# Patient Record
Sex: Female | Born: 1935
Health system: Southern US, Community
[De-identification: ages and names within clinical notes are randomized; demographics above are authoritative.]

## PROBLEM LIST (undated history)

## (undated) DIAGNOSIS — K219 Gastro-esophageal reflux disease without esophagitis: Secondary | ICD-10-CM

## (undated) DIAGNOSIS — F329 Major depressive disorder, single episode, unspecified: Secondary | ICD-10-CM

## (undated) DIAGNOSIS — E785 Hyperlipidemia, unspecified: Secondary | ICD-10-CM

## (undated) DIAGNOSIS — F418 Other specified anxiety disorders: Secondary | ICD-10-CM

## (undated) DIAGNOSIS — I5022 Chronic systolic (congestive) heart failure: Secondary | ICD-10-CM

## (undated) DIAGNOSIS — I639 Cerebral infarction, unspecified: Secondary | ICD-10-CM

## (undated) DIAGNOSIS — R32 Unspecified urinary incontinence: Secondary | ICD-10-CM

## (undated) DIAGNOSIS — E7800 Pure hypercholesterolemia, unspecified: Secondary | ICD-10-CM

## (undated) DIAGNOSIS — I429 Cardiomyopathy, unspecified: Secondary | ICD-10-CM

## (undated) HISTORY — DX: Cardiomyopathy, unspecified: I42.9

## (undated) HISTORY — PX: TOTAL KNEE ARTHROPLASTY: SHX125

## (undated) HISTORY — DX: Major depressive disorder, single episode, unspecified: F32.9

## (undated) HISTORY — DX: Pure hypercholesterolemia, unspecified: E78.00

## (undated) HISTORY — DX: Other specified anxiety disorders: F41.8

## (undated) HISTORY — DX: Unspecified urinary incontinence: R32

## (undated) HISTORY — DX: Hyperlipidemia, unspecified: E78.5

## (undated) HISTORY — DX: Chronic systolic (congestive) heart failure: I50.22

## (undated) HISTORY — DX: Gastro-esophageal reflux disease without esophagitis: K21.9

## (undated) HISTORY — PX: LEG SURGERY: SHX1003

## (undated) HISTORY — PX: CHOLECYSTECTOMY: SHX55

## (undated) HISTORY — DX: Cerebral infarction, unspecified: I63.9

---

## 2010-10-10 DIAGNOSIS — E785 Hyperlipidemia, unspecified: Secondary | ICD-10-CM | POA: Diagnosis present

## 2010-10-10 HISTORY — DX: Hyperlipidemia, unspecified: E78.5

## 2010-11-12 DIAGNOSIS — M19019 Primary osteoarthritis, unspecified shoulder: Secondary | ICD-10-CM

## 2010-11-12 HISTORY — DX: Primary osteoarthritis, unspecified shoulder: M19.019

## 2011-04-09 DIAGNOSIS — G4733 Obstructive sleep apnea (adult) (pediatric): Secondary | ICD-10-CM

## 2011-04-09 HISTORY — DX: Obstructive sleep apnea (adult) (pediatric): G47.33

## 2011-04-16 DIAGNOSIS — M1710 Unilateral primary osteoarthritis, unspecified knee: Secondary | ICD-10-CM | POA: Insufficient documentation

## 2011-04-16 HISTORY — DX: Unilateral primary osteoarthritis, unspecified knee: M17.10

## 2013-06-17 DIAGNOSIS — I5181 Takotsubo syndrome: Secondary | ICD-10-CM

## 2013-06-17 HISTORY — DX: Takotsubo syndrome: I51.81

## 2013-11-02 DIAGNOSIS — H269 Unspecified cataract: Secondary | ICD-10-CM

## 2013-11-02 HISTORY — DX: Unspecified cataract: H26.9

## 2018-04-06 DIAGNOSIS — K922 Gastrointestinal hemorrhage, unspecified: Secondary | ICD-10-CM

## 2018-04-06 DIAGNOSIS — D72829 Elevated white blood cell count, unspecified: Secondary | ICD-10-CM

## 2018-04-06 DIAGNOSIS — K5731 Diverticulosis of large intestine without perforation or abscess with bleeding: Secondary | ICD-10-CM

## 2018-04-06 HISTORY — DX: Elevated white blood cell count, unspecified: D72.829

## 2018-04-06 HISTORY — DX: Gastrointestinal hemorrhage, unspecified: K92.2

## 2018-04-06 HISTORY — DX: Diverticulosis of large intestine without perforation or abscess with bleeding: K57.31

## 2019-02-13 NOTE — Progress Notes (Signed)
Cardiology Office Note   Date:  02/16/2019   ID:  Kayla Carrillo, DOB 10-Apr-1936, MRN [id number]  PCP:  [name], MD  Cardiologist:   No primary care provider on file. Referring:  [name], MD  Chief Complaint  Patient presents with  . Cardiomyopathy      History of Present Illness: Kayla Carrillo is a 83 y.o. female who is referred by [name], MD presents for cardiomyopathy.      Reviewing her primary care records she has a history of Takotsubo cardiomyopathy many years ago.  I do not have any previous cardiology records.    She does not recall any of the particulars around this or when this was.  She has seen a cardiologist that is about a year behind since she moved from [place] to live here.  She has a husband with Alzheimer's who was taken back to the [facility] by his daughters.  Apparently there was family strength around this and she is now living here.  Sounds like she has had a stress.  She does relatively well from a cardiac standpoint it sounds like.  She does not describe shortness of breath, PND or orthopnea.  She does not have chest pressure, neck or arm discomfort.  However, there is a question of falls versus syncope.  She has had falls with trauma and may or may not have very brief episodes of consciousness.  She cannot recall the particulars about this.  She does have an abnormal EKG but I do not have one for baseline.  She has worn an event monitor she said some years ago that was unremarkable.   Past Medical History:  Diagnosis Date  . Cardiomyopathy (HCC)    Takotsubo  . Depression with anxiety   . Dyslipidemia   . GERD (gastroesophageal reflux disease)     Past Surgical History:  Procedure Laterality Date  . CHOLECYSTECTOMY    . LEG SURGERY     Metal rod left leg  . TOTAL KNEE ARTHROPLASTY     Bilateral     Current Outpatient Medications  Medication Sig Dispense Refill  . Ascorbic Acid (VITAMIN C) 100 MG tablet Take  100 mg by mouth daily.    Marland Kitchen aspirin EC 81 MG tablet Take 81 mg by mouth daily.    . carvedilol (COREG) 6.25 MG tablet Take 6.25 mg by mouth 2 (two) times daily with a meal.    . GLUCOSAMINE-CALCIUM-VIT D PO Take by mouth.    . meclizine (ANTIVERT) 25 MG tablet Take 25 mg by mouth 3 (three) times daily as needed for dizziness.    . meloxicam (MOBIC) 15 MG tablet Take 15 mg by mouth daily.    . Omega-3 Fatty Acids (FISH OIL) 1000 MG CAPS Take 1 capsule by mouth daily.     Marland Kitchen omeprazole (PRILOSEC) 20 MG capsule Take 20 mg by mouth daily.    . promethazine-dextromethorphan (PROMETHAZINE-DM) 6.25-15 MG/5ML syrup Take 5 mLs by mouth 4 (four) times daily as needed for cough.    . sertraline (ZOLOFT) 50 MG tablet Take 50 mg by mouth daily.    . simvastatin (ZOCOR) 40 MG tablet Take 40 mg by mouth daily.     No current facility-administered medications for this visit.     Allergies:   Codeine sulfate [codeine] and Sulfa antibiotics    Social History:  The patient  reports that she has never smoked. She has never used smokeless tobacco.   Family History:  The  patient's family history is not on file.    ROS:  Please see the history of present illness.   Otherwise, review of systems are positive for decreased memory.   All other systems are reviewed and negative.    PHYSICAL EXAM: VS:  BP 90/62 (BP Location: Left Arm, Patient Position: Sitting, Cuff Size: Normal)   Pulse 97   Ht 5\' 3"  (1.6 m)   Wt 184 lb (83.5 kg)   BMI 32.59 kg/m  , BMI Body mass index is 32.59 kg/m. GENERAL:  Well appearing HEENT:  Pupils equal round and reactive, fundi not visualized, oral mucosa unremarkable NECK:  No jugular venous distention, waveform within normal limits, carotid upstroke brisk and symmetric, no bruits, no thyromegaly LYMPHATICS:  No cervical, inguinal adenopathy LUNGS:  Clear to auscultation bilaterally BACK:  No CVA tenderness CHEST:  Unremarkable HEART:  PMI not displaced or sustained,S1 and S2  within normal limits, no S3, no S4, no clicks, no rubs, no murmurs ABD:  Flat, positive bowel sounds normal in frequency in pitch, no bruits, no rebound, no guarding, no midline pulsatile mass, no hepatomegaly, no splenomegaly EXT:  2 plus pulses throughout, no edema, no cyanosis no clubbing SKIN:  No rashes no nodules NEURO:  Cranial nerves II through XII grossly intact, motor grossly intact throughout PSYCH:  Cognitively intact, oriented to person place and time    EKG:  EKG is ordered today. The ekg ordered today demonstrates sinus rhythm, rate 97, borderline interventricular conduction delay, left axis deviation, poor anterior R wave progression, possible old anteroseptal infarct   Recent Labs: No results found for requested labs within last 8760 hours.    Lipid Panel No results found for: CHOL, TRIG, HDL, CHOLHDL, VLDL, LDLCALC, LDLDIRECT    Wt Readings from Last 3 Encounters:  02/16/19 184 lb (83.5 kg)      Other studies Reviewed: Additional studies/ records that were reviewed today include: None. Review of the above records demonstrates:  Please see elsewhere in the note.     ASSESSMENT AND PLAN:  CARDIOMYOPATHY: Further evaluate I will check an echocardiogram.  She does seem to be euvolemic.  I will try to get old records to further understand this event.  At this point I do not see need to change medications.  SYNCOPE: It is unclear whether she is having syncope.  And then try to get previous records.  I will have a low threshold for ordering an event recorder in the future.  Of note she was not orthostatic in the office today.  There was an initial slight drop in her blood pressure and I talked about this but it did not sustain and came back up within a couple of minutes.  ABNORMAL EKG: I am going to start with the echocardiogram as above.   Current medicines are reviewed at length with the patient today.  The patient does not have concerns regarding  medicines.  The following changes have been made:  no change  Labs/ tests ordered today include:   Orders Placed This Encounter  Procedures  . EKG 12-Lead  . ECHOCARDIOGRAM COMPLETE     Disposition:   FU with me in two months.     Signed, Rollene Rotunda, MD  02/16/2019 3:23 PM    [facility]

## 2019-02-16 ENCOUNTER — Encounter (INDEPENDENT_AMBULATORY_CARE_PROVIDER_SITE_OTHER): Payer: Self-pay

## 2019-02-16 ENCOUNTER — Encounter: Payer: Self-pay | Admitting: Cardiology

## 2019-02-16 ENCOUNTER — Ambulatory Visit (INDEPENDENT_AMBULATORY_CARE_PROVIDER_SITE_OTHER): Payer: Medicare Other | Admitting: Cardiology

## 2019-02-16 VITALS — BP 90/62 | HR 97 | Ht 63.0 in | Wt 184.0 lb

## 2019-02-16 DIAGNOSIS — R55 Syncope and collapse: Secondary | ICD-10-CM | POA: Insufficient documentation

## 2019-02-16 DIAGNOSIS — I429 Cardiomyopathy, unspecified: Secondary | ICD-10-CM

## 2019-02-16 DIAGNOSIS — R9431 Abnormal electrocardiogram [ECG] [EKG]: Secondary | ICD-10-CM | POA: Insufficient documentation

## 2019-02-16 HISTORY — DX: Abnormal electrocardiogram (ECG) (EKG): R94.31

## 2019-02-16 NOTE — Patient Instructions (Addendum)
Medication Instructions:  Continue current medications  If you need a refill on your cardiac medications before your next appointment, please call your pharmacy.  Labwork: None Ordered .  Testing/Procedures: Your physician has requested that you have an echocardiogram. Echocardiography is a painless test that uses sound waves to create images of your heart. It provides your doctor with information about the size and shape of your heart and how well your heart's chambers and valves are working. This procedure takes approximately one hour. There are no restrictions for this procedure.   Follow-Up: . Your physician recommends that you schedule a follow-up appointment in: 2 Months   At [facility], you and your health needs are our priority.  As part of our continuing mission to provide you with exceptional heart care, we have created designated Provider Care Teams.  These Care Teams include your primary Cardiologist (physician) and Advanced Practice Providers (APPs -  Physician Assistants and Nurse Practitioners) who all work together to provide you with the care you need, when you need it.  Thank you for choosing [facility] at [facility]!!

## 2019-02-26 ENCOUNTER — Telehealth: Payer: Self-pay | Admitting: Cardiology

## 2019-02-26 NOTE — Telephone Encounter (Signed)
Records received from [facility] on 02/26/19, Appt 04/20/19 @ 2:40PM. NV

## 2019-04-15 ENCOUNTER — Telehealth: Payer: Self-pay | Admitting: Cardiology

## 2019-04-15 NOTE — Telephone Encounter (Signed)
Left detailed message that someone will call re echo and appt when staff is working on Tuesday's schedule .cy

## 2019-04-15 NOTE — Telephone Encounter (Signed)
New Message    Pt is calling about her Echo and Office Visit appt  She wants to know if she has to change these appts   Please call

## 2019-04-16 ENCOUNTER — Telehealth: Payer: Self-pay | Admitting: Internal Medicine

## 2019-04-16 NOTE — Telephone Encounter (Signed)
Agree.  Please reschedule for June

## 2019-04-16 NOTE — Telephone Encounter (Signed)
Reviewed records Pt seen by J Hochrein.   Echo ordered to establish LVEF  With corona virus pandemic and recommendations to minimize patient/staff exposure would recomm postponing echo to June  When things should improve from infection standpoint    Will forward to [employer]

## 2019-04-20 ENCOUNTER — Ambulatory Visit: Payer: Medicare Other | Admitting: Cardiology

## 2019-04-20 ENCOUNTER — Other Ambulatory Visit (HOSPITAL_COMMUNITY): Payer: Medicare Other

## 2019-06-16 ENCOUNTER — Telehealth (HOSPITAL_COMMUNITY): Payer: Self-pay | Admitting: Radiology

## 2019-06-16 NOTE — Telephone Encounter (Signed)
COVID-19 Pre-Screening Questions:  . Do you currently have a fever?NO (yes = cancel and refer to pcp for e-visit) . Have you recently travelled on a cruise, internationally, or to [place], [place], [place], [place], [place], or [place], [place] ([place]) ? NO (yes = cancel, stay home, monitor symptoms, and contact pcp or initiate e-visit if symptoms develop) . Have you been in contact with someone that is currently pending confirmation of Covid19 testing or has been confirmed to have the Covid19 virus?  NO (yes = cancel, stay home, away from tested individual, monitor symptoms, and contact pcp or initiate e-visit if symptoms develop) . Are you currently experiencing fatigue or cough? NO (yes = pt should be prepared to have a mask placed at the time of their visit).      

## 2019-06-17 ENCOUNTER — Ambulatory Visit (HOSPITAL_COMMUNITY): Payer: Medicare Other | Attending: Internal Medicine

## 2019-06-17 ENCOUNTER — Encounter (INDEPENDENT_AMBULATORY_CARE_PROVIDER_SITE_OTHER): Payer: Self-pay

## 2019-06-17 ENCOUNTER — Other Ambulatory Visit: Payer: Self-pay

## 2019-06-17 DIAGNOSIS — I447 Left bundle-branch block, unspecified: Secondary | ICD-10-CM | POA: Diagnosis not present

## 2019-06-17 DIAGNOSIS — E785 Hyperlipidemia, unspecified: Secondary | ICD-10-CM | POA: Diagnosis not present

## 2019-06-17 DIAGNOSIS — I429 Cardiomyopathy, unspecified: Secondary | ICD-10-CM | POA: Diagnosis present

## 2019-06-17 DIAGNOSIS — F418 Other specified anxiety disorders: Secondary | ICD-10-CM | POA: Insufficient documentation

## 2019-06-28 ENCOUNTER — Ambulatory Visit: Payer: Medicare Other | Admitting: Cardiology

## 2019-06-28 ENCOUNTER — Telehealth: Payer: Self-pay | Admitting: Cardiology

## 2019-06-28 NOTE — Telephone Encounter (Signed)
LVM, reminding pt of her appt on 06-29-19 with Dr Percival Spanish.

## 2019-06-28 NOTE — Progress Notes (Signed)
Cardiology Office Note   Date:  06/29/2019   ID:  Kayla Carrillo, DOB 1936-09-13, MRN [id number]  PCP:  Lujean Amel, MD  Cardiologist:   No primary care provider on file. Referring:  Lujean Amel, MD  No chief complaint on file.     History of Present Illness: Kayla Carrillo is a 83 y.o. female who is referred by Lujean Amel, MD presents for cardiomyopathy.   She had a history of Takotsubo cardiomyopathy many years ago.  I saw her as a new patient and she was to get an echo but then the pandemic happened.  She just had the echo a few days ago and this demonstrated a reduced EF of 45% but with severe LVH.      I was able to look back and find a 2018 echocardiogram from another facility and her EF was reported to be 55% with moderate left ventricular hypertrophy.  She does not have any new symptoms specific to a cardiac etiology.  She has a lot of stress in her life as she is going through a divorce.  She does have fatigue easily.  However, she is not describing overt shortness of breath, PND or orthopnea.  She is not having any palpitations, presyncope or syncope.  As previously described she has falls but these have not been specifically described as loss of consciousness.  She walks with a cane.  She has not fallen in quite a while actually.   Past Medical History:  Diagnosis Date  . Cardiomyopathy ([place])    Takotsubo  . Depression with anxiety   . Dyslipidemia   . GERD (gastroesophageal reflux disease)     Past Surgical History:  Procedure Laterality Date  . CHOLECYSTECTOMY    . LEG SURGERY     Metal rod left leg  . TOTAL KNEE ARTHROPLASTY     Bilateral     Current Outpatient Medications  Medication Sig Dispense Refill  . Ascorbic Acid (VITAMIN C) 100 MG tablet Take 100 mg by mouth daily.    Marland Kitchen aspirin EC 81 MG tablet Take 81 mg by mouth daily.    . carvedilol (COREG) 6.25 MG tablet Take 6.25 mg by mouth 2 (two) times daily with a meal.    .  GLUCOSAMINE-CALCIUM-VIT D PO Take by mouth.    . meclizine (ANTIVERT) 25 MG tablet Take 25 mg by mouth 3 (three) times daily as needed for dizziness.    . meloxicam (MOBIC) 15 MG tablet Take 15 mg by mouth daily.    . Omega-3 Fatty Acids (FISH OIL) 1000 MG CAPS Take 1 capsule by mouth daily.     Marland Kitchen omeprazole (PRILOSEC) 20 MG capsule Take 20 mg by mouth daily.    . promethazine-dextromethorphan (PROMETHAZINE-DM) 6.25-15 MG/5ML syrup Take 5 mLs by mouth 4 (four) times daily as needed for cough.    . sertraline (ZOLOFT) 50 MG tablet Take 50 mg by mouth daily.    . simvastatin (ZOCOR) 40 MG tablet Take 40 mg by mouth daily.     No current facility-administered medications for this visit.     Allergies:   Codeine sulfate [codeine] and Sulfa antibiotics    ROS:  Please see the history of present illness.   Otherwise, review of systems are positive for decreased memory,'s urinary incontinence, falls with injury.   All other systems are reviewed and negative.    PHYSICAL EXAM: VS:  BP 125/72   Pulse 80   Temp 98.7 F (37.1 C)  Ht 5\' 3"  (1.6 m)   Wt 184 lb 6.4 oz (83.6 kg)   BMI 32.66 kg/m  , BMI Body mass index is 32.66 kg/m. GENERAL:  Well appearing NECK:  No jugular venous distention, waveform within normal limits, carotid upstroke brisk and symmetric, no bruits, no thyromegaly LUNGS:  Clear to auscultation bilaterally CHEST:  Unremarkable HEART:  PMI not displaced or sustained,S1 and S2 within normal limits, no S3, no S4, no clicks, no rubs, no murmurs ABD:  Flat, positive bowel sounds normal in frequency in pitch, no bruits, no rebound, no guarding, no midline pulsatile mass, no hepatomegaly, no splenomegaly EXT:  2 plus pulses throughout, mild leg edema, no cyanosis no clubbing    EKG:  EKG is not ordered today.   Recent Labs: No results found for requested labs within last 8760 hours.    Lipid Panel No results found for: CHOL, TRIG, HDL, CHOLHDL, VLDL, LDLCALC,  LDLDIRECT    Wt Readings from Last 3 Encounters:  06/29/19 184 lb 6.4 oz (83.6 kg)  02/16/19 184 lb (83.5 kg)      Other studies Reviewed: Additional studies/ records that were reviewed today include: Echo Care Everywhere. Review of the above records demonstrates:  Please see elsewhere in the note.     ASSESSMENT AND PLAN:  CARDIOMYOPATHY:    With her reduced ejection fraction and LVH I will check a PYP scan for amyloidosis.  I have a low suspicion this is actually going on.  Unfortunately her meds cannot be titrated because she has baseline low blood pressures.   LVH: This will be evaluated as above.  SYNCOPE: She has no evidence of frank syncope.  It looks like she has falls.  She has had an extensive work-up to include a monitor in the past.  No further work-up is suggested at this point.  Of note she was not orthostatic in the office previously.    Current medicines are reviewed at length with the patient today.  The patient does not have concerns regarding medicines.  The following changes have been made:  no change  Labs/ tests ordered today include:    No orders of the defined types were placed in this encounter.    Disposition:   FU with me in 3 months.     Signed, Rollene RotundaJames Yatziry Deakins, MD  06/29/2019 11:34 AM    [facility]

## 2019-06-29 ENCOUNTER — Encounter: Payer: Self-pay | Admitting: Cardiology

## 2019-06-29 ENCOUNTER — Other Ambulatory Visit: Payer: Self-pay

## 2019-06-29 ENCOUNTER — Ambulatory Visit (INDEPENDENT_AMBULATORY_CARE_PROVIDER_SITE_OTHER): Payer: Medicare Other | Admitting: Cardiology

## 2019-06-29 VITALS — BP 125/72 | HR 80 | Temp 98.7°F | Ht 63.0 in | Wt 184.4 lb

## 2019-06-29 DIAGNOSIS — E859 Amyloidosis, unspecified: Secondary | ICD-10-CM | POA: Diagnosis not present

## 2019-06-29 DIAGNOSIS — I429 Cardiomyopathy, unspecified: Secondary | ICD-10-CM

## 2019-06-29 DIAGNOSIS — I517 Cardiomegaly: Secondary | ICD-10-CM

## 2019-06-29 NOTE — Patient Instructions (Signed)
Medication Instructions:  Your physician recommends that you continue on your current medications as directed. Please refer to the Current Medication list given to you today.  If you need a refill on your cardiac medications before your next appointment, please call your pharmacy.   Lab work: NONE  Testing/Procedures: NUCLEAR AMYLOIDOSIS TEST   Follow-Up: At [facility], you and your health needs are our priority.  As part of our continuing mission to provide you with exceptional heart care, we have created designated Provider Care Teams.  These Care Teams include your primary Cardiologist (physician) and Advanced Practice Providers (APPs -  Physician Assistants and Nurse Practitioners) who all work together to provide you with the care you need, when you need it. You will need a follow up appointment in 6 months.  Please call our office 2 months in advance to schedule this appointment.  You may see DR [facility]  or one of the following Advanced Practice Providers on your designated Care Team:   Rosaria Ferries, PA-C . Jory Sims, DNP, ANP

## 2019-07-14 ENCOUNTER — Telehealth (HOSPITAL_COMMUNITY): Payer: Self-pay

## 2019-07-14 NOTE — Telephone Encounter (Signed)
Encounter complete. 

## 2019-07-15 ENCOUNTER — Ambulatory Visit (HOSPITAL_COMMUNITY)
Admission: RE | Admit: 2019-07-15 | Discharge: 2019-07-15 | Disposition: A | Discharge status: Home / Self Care | Payer: Medicare Other | Source: Ambulatory Visit | Attending: Cardiology | Admitting: Cardiology

## 2019-07-15 ENCOUNTER — Other Ambulatory Visit: Payer: Self-pay

## 2019-07-15 DIAGNOSIS — E859 Amyloidosis, unspecified: Secondary | ICD-10-CM | POA: Insufficient documentation

## 2019-07-15 MED ORDER — TECHNETIUM TC 99M PYROPHOSPHATE
20.4000 | Freq: Once | INTRAVENOUS | Status: AC
  Administered 2019-07-15: 20.4 via INTRAVENOUS

## 2019-07-16 ENCOUNTER — Telehealth: Payer: Self-pay | Admitting: *Deleted

## 2019-07-16 DIAGNOSIS — R55 Syncope and collapse: Secondary | ICD-10-CM

## 2019-07-16 DIAGNOSIS — I517 Cardiomegaly: Secondary | ICD-10-CM

## 2019-07-16 DIAGNOSIS — I429 Cardiomyopathy, unspecified: Secondary | ICD-10-CM

## 2019-07-16 NOTE — Telephone Encounter (Signed)
Left message to call back  

## 2019-07-16 NOTE — Telephone Encounter (Signed)
-----  Message from James Hochrein, MD sent at 07/15/2019  9:39 PM EDT ----- PYP scan is equivocal for amyloid.  Order multiple myeloma panel.  Call Ms. Dangerfield with the results and send results to Koirala, Dibas, MD 

## 2019-07-21 ENCOUNTER — Other Ambulatory Visit: Payer: Self-pay | Admitting: Family Medicine

## 2019-07-21 DIAGNOSIS — E2839 Other primary ovarian failure: Secondary | ICD-10-CM

## 2019-07-29 NOTE — Telephone Encounter (Signed)
Received a call from [facility] and she will have nurse call back

## 2019-07-29 NOTE — Telephone Encounter (Signed)
Spoke with patient regarding results. She is scheduled to have labs at [facility] 8/7. Left message for [facility] to call back to see about faxing orders for her labs to all be done in our office.

## 2019-07-30 NOTE — Telephone Encounter (Signed)
Spoke with [place] and they were going to see if labs could be done at [facility], outside labs not usually done there. If so were going to fax orders. No orders received as of yet. Placed order for multiple myeloma panel. Left message for patient to call back so she can come next week for lab Dr Percival Spanish requesting.

## 2019-08-05 ENCOUNTER — Telehealth: Payer: Self-pay | Admitting: *Deleted

## 2019-08-05 NOTE — Telephone Encounter (Signed)
The patient has been made aware that the lab orders have been faxed and received. She will come tomorrow for the fasting labs. Orders will be scanned.   Spoke with [place] and they were going to see if labs could be done at [facility], outside labs not usually done there. If so were going to fax orders. No orders received as of yet. Placed order for multiple myeloma panel. Left message for patient to call back so she can come next week for lab Dr Percival Spanish requesting.

## 2019-08-09 LAB — MULTIPLE MYELOMA PANEL, SERUM
Albumin SerPl Elph-Mcnc: 3.4 g/dL (ref 2.9–4.4)
Albumin/Glob SerPl: 1 (ref 0.7–1.7)
Alpha 1: 0.3 g/dL (ref 0.0–0.4)
Alpha2 Glob SerPl Elph-Mcnc: 0.9 g/dL (ref 0.4–1.0)
B-Globulin SerPl Elph-Mcnc: 0.9 g/dL (ref 0.7–1.3)
Gamma Glob SerPl Elph-Mcnc: 1.4 g/dL (ref 0.4–1.8)
Globulin, Total: 3.5 g/dL (ref 2.2–3.9)
IgA/Immunoglobulin A, Serum: 316 mg/dL (ref 64–422)
IgG (Immunoglobin G), Serum: 1533 mg/dL (ref 586–1602)
IgM (Immunoglobulin M), Srm: 44 mg/dL (ref 26–217)
Total Protein: 6.9 g/dL (ref 6.0–8.5)

## 2019-08-09 NOTE — Telephone Encounter (Signed)
Patient had labs last week

## 2019-08-12 ENCOUNTER — Telehealth: Payer: Self-pay | Admitting: *Deleted

## 2019-08-12 NOTE — Telephone Encounter (Signed)
-----   Message from Minus Breeding, MD sent at 08/10/2019  8:31 AM EDT ----- Panel was negative and does not suggest an infiltrative process.  Call Ms. Masci with the results and send results to Koirala, Dibas, MD.  No further imaging at this point.

## 2019-08-12 NOTE — Telephone Encounter (Signed)
Advised patient of lab results  

## 2019-10-04 ENCOUNTER — Other Ambulatory Visit: Payer: Medicare Other

## 2020-01-12 DIAGNOSIS — N958 Other specified menopausal and perimenopausal disorders: Secondary | ICD-10-CM | POA: Diagnosis not present

## 2020-01-12 DIAGNOSIS — Z01419 Encounter for gynecological examination (general) (routine) without abnormal findings: Secondary | ICD-10-CM | POA: Diagnosis not present

## 2020-01-12 DIAGNOSIS — N762 Acute vulvitis: Secondary | ICD-10-CM | POA: Diagnosis not present

## 2020-01-12 DIAGNOSIS — Z6832 Body mass index (BMI) 32.0-32.9, adult: Secondary | ICD-10-CM | POA: Diagnosis not present

## 2020-01-12 DIAGNOSIS — R32 Unspecified urinary incontinence: Secondary | ICD-10-CM | POA: Diagnosis not present

## 2020-01-12 DIAGNOSIS — Z1231 Encounter for screening mammogram for malignant neoplasm of breast: Secondary | ICD-10-CM | POA: Diagnosis not present

## 2020-01-25 DIAGNOSIS — Z79899 Other long term (current) drug therapy: Secondary | ICD-10-CM | POA: Diagnosis not present

## 2020-01-25 DIAGNOSIS — I5022 Chronic systolic (congestive) heart failure: Secondary | ICD-10-CM | POA: Diagnosis not present

## 2020-01-25 DIAGNOSIS — R209 Unspecified disturbances of skin sensation: Secondary | ICD-10-CM | POA: Diagnosis not present

## 2020-01-25 DIAGNOSIS — F321 Major depressive disorder, single episode, moderate: Secondary | ICD-10-CM | POA: Diagnosis not present

## 2020-01-25 DIAGNOSIS — E2839 Other primary ovarian failure: Secondary | ICD-10-CM | POA: Diagnosis not present

## 2020-01-25 DIAGNOSIS — R300 Dysuria: Secondary | ICD-10-CM | POA: Diagnosis not present

## 2020-01-25 DIAGNOSIS — I5181 Takotsubo syndrome: Secondary | ICD-10-CM | POA: Diagnosis not present

## 2020-02-03 DIAGNOSIS — H35351 Cystoid macular degeneration, right eye: Secondary | ICD-10-CM | POA: Diagnosis not present

## 2020-02-03 DIAGNOSIS — H353132 Nonexudative age-related macular degeneration, bilateral, intermediate dry stage: Secondary | ICD-10-CM | POA: Diagnosis not present

## 2020-02-03 DIAGNOSIS — H353211 Exudative age-related macular degeneration, right eye, with active choroidal neovascularization: Secondary | ICD-10-CM | POA: Diagnosis not present

## 2020-03-14 DIAGNOSIS — H353132 Nonexudative age-related macular degeneration, bilateral, intermediate dry stage: Secondary | ICD-10-CM | POA: Diagnosis not present

## 2020-03-14 DIAGNOSIS — H35351 Cystoid macular degeneration, right eye: Secondary | ICD-10-CM | POA: Diagnosis not present

## 2020-03-14 DIAGNOSIS — H3561 Retinal hemorrhage, right eye: Secondary | ICD-10-CM | POA: Diagnosis not present

## 2020-03-14 DIAGNOSIS — H353211 Exudative age-related macular degeneration, right eye, with active choroidal neovascularization: Secondary | ICD-10-CM | POA: Diagnosis not present

## 2020-04-25 ENCOUNTER — Other Ambulatory Visit: Payer: Self-pay

## 2020-04-25 ENCOUNTER — Encounter (INDEPENDENT_AMBULATORY_CARE_PROVIDER_SITE_OTHER): Payer: Self-pay | Admitting: Ophthalmology

## 2020-04-25 ENCOUNTER — Ambulatory Visit (INDEPENDENT_AMBULATORY_CARE_PROVIDER_SITE_OTHER): Payer: PPO | Admitting: Ophthalmology

## 2020-04-25 DIAGNOSIS — H35351 Cystoid macular degeneration, right eye: Secondary | ICD-10-CM

## 2020-04-25 DIAGNOSIS — H353122 Nonexudative age-related macular degeneration, left eye, intermediate dry stage: Secondary | ICD-10-CM | POA: Diagnosis not present

## 2020-04-25 DIAGNOSIS — H353211 Exudative age-related macular degeneration, right eye, with active choroidal neovascularization: Secondary | ICD-10-CM | POA: Insufficient documentation

## 2020-04-25 HISTORY — DX: Nonexudative age-related macular degeneration, left eye, intermediate dry stage: H35.3122

## 2020-04-25 HISTORY — DX: Exudative age-related macular degeneration, right eye, with active choroidal neovascularization: H35.3211

## 2020-04-25 HISTORY — DX: Cystoid macular degeneration, right eye: H35.351

## 2020-04-25 MED ORDER — BEVACIZUMAB CHEMO INJECTION 1.25MG/0.05ML SYRINGE FOR KALEIDOSCOPE
1.2500 mg | INTRAVITREAL | Status: AC | PRN
  Administered 2020-04-25: 15:00:00 1.25 mg via INTRAVITREAL

## 2020-04-25 NOTE — Progress Notes (Signed)
04/25/2020     CHIEF COMPLAINT Patient presents for Retina Follow Up   HISTORY OF PRESENT ILLNESS: Kayla Carrillo is a 84 y.o. female who presents to the clinic today for:   HPI    Retina Follow Up    Patient presents with  Wet AMD.  In right eye.  This started 6 weeks ago.  Severity is mild.  Duration of 6 weeks.  Since onset it is stable.          Comments    6 Week AMD F/U OD, poss Avastin OD  Pt denies noticeable changes to [place] OU since last visit. Pt denies ocular pain, flashes of light, or floaters OU.         Last edited by Rockie Neighbours, [place] on 04/25/2020  2:05 PM. (History)      Referring physician: Lujean Amel, MD [facility] [address],  [place] [address]  HISTORICAL INFORMATION:   Selected notes from the [facility]: No current outpatient medications on file. (Ophthalmic Drugs)   No current facility-administered medications for this visit. (Ophthalmic Drugs)   Current Outpatient Medications (Other)  Medication Sig  . Ascorbic Acid (VITAMIN C) 100 MG tablet Take 100 mg by mouth daily.  Marland Kitchen aspirin EC 81 MG tablet Take 81 mg by mouth daily.  . carvedilol (COREG) 6.25 MG tablet Take 6.25 mg by mouth 2 (two) times daily with a meal.  . GLUCOSAMINE-CALCIUM-VIT D PO Take by mouth.  . meclizine (ANTIVERT) 25 MG tablet Take 25 mg by mouth 3 (three) times daily as needed for dizziness.  . meloxicam (MOBIC) 15 MG tablet Take 15 mg by mouth daily.  . Omega-3 Fatty Acids (FISH OIL) 1000 MG CAPS Take 1 capsule by mouth daily.   Marland Kitchen omeprazole (PRILOSEC) 20 MG capsule Take 20 mg by mouth daily.  . promethazine-dextromethorphan (PROMETHAZINE-DM) 6.25-15 MG/5ML syrup Take 5 mLs by mouth 4 (four) times daily as needed for cough.  . sertraline (ZOLOFT) 50 MG tablet Take 50 mg by mouth daily.  . simvastatin (ZOCOR) 40 MG tablet Take 40 mg by mouth daily.   No current facility-administered medications for this  visit. (Other)      REVIEW OF SYSTEMS:    ALLERGIES Allergies  Allergen Reactions  . Codeine Sulfate [Codeine] Other (See Comments)    drowsey  . Sulfa Antibiotics Other (See Comments)    drowsey    PAST MEDICAL HISTORY Past Medical History:  Diagnosis Date  . Cardiomyopathy ([place])    Takotsubo  . Depression with anxiety   . Dyslipidemia   . GERD (gastroesophageal reflux disease)    Past Surgical History:  Procedure Laterality Date  . CHOLECYSTECTOMY    . LEG SURGERY     Metal rod left leg  . TOTAL KNEE ARTHROPLASTY     Bilateral    FAMILY HISTORY History reviewed. No pertinent family history.  SOCIAL HISTORY Social History   Tobacco Use  . Smoking status: Never Smoker  . Smokeless tobacco: Never Used  Substance Use Topics  . Alcohol use: Not on file  . Drug use: Not on file         OPHTHALMIC EXAM:  Base Eye Exam    Visual Acuity (ETDRS)      Right Left   Dist [place] 20/50 +1 20/40 -2   Dist ph  20/40 +2 20/25       Tonometry (Tonopen, 2:12 PM)  Right Left   Pressure 10 12       Pupils      Pupils Dark Light Shape React APD   Right PERRL 4 3 Round Brisk None   Left PERRL 4 3 Round Brisk None       Visual Fields (Counting fingers)      Left Right    Full Full       Extraocular Movement      Right Left    Full Full       Neuro/Psych    Oriented x3: Yes   Mood/Affect: Normal       Dilation    Right eye: 1.0% Mydriacyl, 2.5% Phenylephrine @ 2:12 PM        Slit Lamp and Fundus Exam    External Exam      Right Left   External Normal Normal       Slit Lamp Exam      Right Left   Lids/Lashes Normal Normal   Conjunctiva/Sclera White and quiet White and quiet   Cornea Clear Clear   Anterior Chamber Deep and quiet Deep and quiet   Iris Round and reactive Round and reactive   Lens Posterior chamber intraocular lens Posterior chamber intraocular lens   Anterior Vitreous Normal Normal       Fundus Exam      Right Left     Posterior Vitreous Posterior vitreous detachment    Disc Normal    C/D Ratio 0.1    Macula Geographic atrophy,,, Drusen, Hard drusen    Vessels Normal    Periphery Normal           IMAGING AND PROCEDURES  Imaging and Procedures for 04/25/20  OCT, Retina - OU - Both Eyes       Right Eye Quality was good. Scan locations included subfoveal. Central Foveal Thickness: 242. Progression has improved. Findings include outer retinal atrophy, intraretinal fluid.   Left Eye Quality was good. Scan locations included subfoveal. Central Foveal Thickness: 257. Progression has been stable. Findings include abnormal foveal contour, retinal drusen , central retinal atrophy.   Notes The much less active CNVM and intraretinal CME yet watch nasal region.  Repeat intravitreal Avastin OD today and exam in 6 weeks       Intravitreal Injection, Pharmacologic Agent - OD - Right Eye       Time Out 04/25/2020. 3:11 PM. Confirmed correct patient, procedure, site, and patient consented.   Anesthesia Topical anesthesia was used. Anesthetic medications included [place] 3.5%.   Procedure Preparation included Tobramycin 0.3%, 10% betadine to eyelids.   Injection:  1.25 mg Bevacizumab (AVASTIN) SOLN   NDC: [address]-6, Lot: [id number]   Route: Intravitreal, Site: Right Eye, Waste: 0 mg  Post-op Post injection exam found visual acuity of at least counting fingers. The patient tolerated the procedure well. There were no complications. The patient received written and verbal post procedure care education. Post injection medications were not given.                 ASSESSMENT/PLAN:  Exudative age-related macular degeneration of right eye with active choroidal neovascularization (HCC) The nature of wet macular degeneration was discussed with the patient.  Forms of therapy reviewed include the use of Anti-VEGF medications injected painlessly into the eye, as well as other possible treatment  modalities, including thermal laser therapy. Fellow eye involvement and risks were discussed with the patient. Upon the finding of wet age related macular degeneration, treatment will be offered.  The treatment regimen is on a treat as needed basis with the intent to treat if necessary and extend interval of exams when possible. On average 1 out of 6 patients do not need lifetime therapy. However, the risk of recurrent disease is high for a lifetime.  Initially monthly, then periodic, examinations and evaluations will determine whether the next treatment is required on the day of the examination.  OD, improved and stable yet watch nasal to the foveal avascular zone, repeat Avastin today intravitreal and exam in 6 weeks      ICD-10-CM   1. Exudative age-related macular degeneration of right eye with active choroidal neovascularization (HCC)  H35.3211 OCT, Retina - OU - Both Eyes    Intravitreal Injection, Pharmacologic Agent - OD - Right Eye    Bevacizumab (AVASTIN) SOLN 1.25 mg  2. Cystoid macular edema of right eye  H35.351   3. Intermediate stage nonexudative age-related macular degeneration of left eye  H35.3122     1.The much less active CNVM and intraretinal CME yet watch nasal region.  Repeat intravitreal Avastin OD today and exam in 6 weeks  2.  3.  Ophthalmic Meds Ordered this visit:  Meds ordered this encounter  Medications  . Bevacizumab (AVASTIN) SOLN 1.25 mg       Return in about 6 weeks (around 06/06/2020) for OD, AVASTIN OCT.  There are no Patient Instructions on file for this visit.   Explained the diagnoses, plan, and follow up with the patient and they expressed understanding.  Patient expressed understanding of the importance of proper follow up care.   Alford Highland Josias Tomerlin M.D. Diseases & Surgery of the Retina and Vitreous [facility] 04/25/20     Abbreviations: M myopia (nearsighted); A astigmatism; H hyperopia (farsighted); P presbyopia; Mrx  spectacle prescription;  CTL contact lenses; OD right eye; OS left eye; OU both eyes  XT exotropia; ET esotropia; PEK punctate epithelial keratitis; PEE punctate epithelial erosions; DES dry eye syndrome; MGD meibomian gland dysfunction; ATs artificial tears; PFAT's preservative free artificial tears; NSC nuclear sclerotic cataract; PSC posterior subcapsular cataract; ERM epi-retinal membrane; PVD posterior vitreous detachment; RD retinal detachment; DM diabetes mellitus; DR diabetic retinopathy; NPDR non-proliferative diabetic retinopathy; PDR proliferative diabetic retinopathy; CSME clinically significant macular edema; DME diabetic macular edema; dbh dot blot hemorrhages; CWS cotton wool spot; POAG primary open angle glaucoma; C/D cup-to-disc ratio; HVF humphrey visual field; GVF goldmann visual field; OCT optical coherence tomography; IOP intraocular pressure; BRVO Branch retinal vein occlusion; CRVO central retinal vein occlusion; CRAO central retinal artery occlusion; BRAO branch retinal artery occlusion; RT retinal tear; SB scleral buckle; PPV pars plana vitrectomy; VH Vitreous hemorrhage; PRP panretinal laser photocoagulation; IVK intravitreal kenalog; VMT vitreomacular traction; MH Macular hole;  NVD neovascularization of the disc; NVE neovascularization elsewhere; AREDS age related eye disease study; ARMD age related macular degeneration; POAG primary open angle glaucoma; EBMD epithelial/anterior basement membrane dystrophy; ACIOL anterior chamber intraocular lens; IOL intraocular lens; PCIOL posterior chamber intraocular lens; Phaco/IOL phacoemulsification with intraocular lens placement; PRK photorefractive keratectomy; LASIK laser assisted in situ keratomileusis; HTN hypertension; DM diabetes mellitus; COPD chronic obstructive pulmonary disease

## 2020-04-25 NOTE — Assessment & Plan Note (Signed)
The nature of wet macular degeneration was discussed with the patient.  Forms of therapy reviewed include the use of Anti-VEGF medications injected painlessly into the eye, as well as other possible treatment modalities, including thermal laser therapy. Fellow eye involvement and risks were discussed with the patient. Upon the finding of wet age related macular degeneration, treatment will be offered. The treatment regimen is on a treat as needed basis with the intent to treat if necessary and extend interval of exams when possible. On average 1 out of 6 patients do not need lifetime therapy. However, the risk of recurrent disease is high for a lifetime.  Initially monthly, then periodic, examinations and evaluations will determine whether the next treatment is required on the day of the examination.  OD, improved and stable yet watch nasal to the foveal avascular zone, repeat Avastin today intravitreal and exam in 6 weeks

## 2020-05-17 DIAGNOSIS — N3944 Nocturnal enuresis: Secondary | ICD-10-CM | POA: Diagnosis not present

## 2020-05-17 DIAGNOSIS — N3942 Incontinence without sensory awareness: Secondary | ICD-10-CM | POA: Diagnosis not present

## 2020-05-17 DIAGNOSIS — N3946 Mixed incontinence: Secondary | ICD-10-CM | POA: Diagnosis not present

## 2020-06-06 ENCOUNTER — Encounter (INDEPENDENT_AMBULATORY_CARE_PROVIDER_SITE_OTHER): Payer: PPO | Admitting: Ophthalmology

## 2020-06-07 DIAGNOSIS — R05 Cough: Secondary | ICD-10-CM | POA: Diagnosis not present

## 2020-06-07 DIAGNOSIS — J209 Acute bronchitis, unspecified: Secondary | ICD-10-CM | POA: Diagnosis not present

## 2020-06-07 DIAGNOSIS — R062 Wheezing: Secondary | ICD-10-CM | POA: Diagnosis not present

## 2020-06-13 ENCOUNTER — Ambulatory Visit (INDEPENDENT_AMBULATORY_CARE_PROVIDER_SITE_OTHER): Payer: PPO | Admitting: Ophthalmology

## 2020-06-13 ENCOUNTER — Other Ambulatory Visit: Payer: Self-pay

## 2020-06-13 ENCOUNTER — Encounter (INDEPENDENT_AMBULATORY_CARE_PROVIDER_SITE_OTHER): Payer: Self-pay | Admitting: Ophthalmology

## 2020-06-13 DIAGNOSIS — H353211 Exudative age-related macular degeneration, right eye, with active choroidal neovascularization: Secondary | ICD-10-CM | POA: Diagnosis not present

## 2020-06-13 MED ORDER — BEVACIZUMAB CHEMO INJECTION 1.25MG/0.05ML SYRINGE FOR KALEIDOSCOPE
1.2500 mg | INTRAVITREAL | Status: AC | PRN
  Administered 2020-06-13: 1.25 mg via INTRAVITREAL

## 2020-06-13 NOTE — Progress Notes (Signed)
06/13/2020     CHIEF COMPLAINT Patient presents for Retina Follow Up   HISTORY OF PRESENT ILLNESS: Kayla Carrillo is a 84 y.o. female who presents to the clinic today for:   HPI    Retina Follow Up    Patient presents with  Wet AMD.  In right eye.  This started 7 weeks ago.  Severity is mild.  Duration of 7 weeks.  Since onset it is stable.          Comments    7 Week AMD F/U OD, poss Avastin OD  Pt denies noticeable changes to [place] OU since last visit. Pt denies ocular pain, flashes of light, or floaters OU. Pt reports continued double VA OD when turning to the side.        Last edited by Ileana Roup, COA on 06/13/2020  2:53 PM. (History)      Referring physician: [name], MD [address] [place],  [place] [address]  HISTORICAL INFORMATION:   Selected notes from the MEDICAL RECORD NUMBER       CURRENT MEDICATIONS: No current outpatient medications on file. (Ophthalmic Drugs)   No current facility-administered medications for this visit. (Ophthalmic Drugs)   Current Outpatient Medications (Other)  Medication Sig  . Ascorbic Acid (VITAMIN C) 100 MG tablet Take 100 mg by mouth daily.  Marland Kitchen aspirin EC 81 MG tablet Take 81 mg by mouth daily.  . carvedilol (COREG) 6.25 MG tablet Take 6.25 mg by mouth 2 (two) times daily with a meal.  . GLUCOSAMINE-CALCIUM-VIT D PO Take by mouth.  . meclizine (ANTIVERT) 25 MG tablet Take 25 mg by mouth 3 (three) times daily as needed for dizziness.  . meloxicam (MOBIC) 15 MG tablet Take 15 mg by mouth daily.  . Omega-3 Fatty Acids (FISH OIL) 1000 MG CAPS Take 1 capsule by mouth daily.   Marland Kitchen omeprazole (PRILOSEC) 20 MG capsule Take 20 mg by mouth daily.  . promethazine-dextromethorphan (PROMETHAZINE-DM) 6.25-15 MG/5ML syrup Take 5 mLs by mouth 4 (four) times daily as needed for cough.  . sertraline (ZOLOFT) 50 MG tablet Take 50 mg by mouth daily.  . simvastatin (ZOCOR) 40 MG tablet Take 40 mg by mouth daily.    No current facility-administered medications for this visit. (Other)      REVIEW OF SYSTEMS:    ALLERGIES Allergies  Allergen Reactions  . Codeine Sulfate [Codeine] Other (See Comments)    drowsey  . Sulfa Antibiotics Other (See Comments)    drowsey    PAST MEDICAL HISTORY Past Medical History:  Diagnosis Date  . Cardiomyopathy (HCC)    Takotsubo  . Depression with anxiety   . Dyslipidemia   . GERD (gastroesophageal reflux disease)    Past Surgical History:  Procedure Laterality Date  . CHOLECYSTECTOMY    . LEG SURGERY     Metal rod left leg  . TOTAL KNEE ARTHROPLASTY     Bilateral    FAMILY HISTORY History reviewed. No pertinent family history.  SOCIAL HISTORY Social History   Tobacco Use  . Smoking status: Never Smoker  . Smokeless tobacco: Never Used  Substance Use Topics  . Alcohol use: Not on file  . Drug use: Not on file         OPHTHALMIC EXAM:  Base Eye Exam    Visual Acuity (ETDRS)      Right Left   Dist [place] 20/50 +2 20/40 -1   Dist ph [place] 20/30 -1 20/20 -1  Tonometry (Tonopen, 2:57 PM)      Right Left   Pressure 10 11       Pupils      Pupils Dark Light Shape React APD   Right PERRL 4 3 Round Brisk None   Left PERRL 4 3 Round Brisk None       Visual Fields (Counting fingers)      Left Right    Full Full       Extraocular Movement      Right Left    Full Full       Neuro/Psych    Oriented x3: Yes   Mood/Affect: Normal       Dilation    Right eye: 1.0% Mydriacyl, 2.5% Phenylephrine @ 2:57 PM        Slit Lamp and Fundus Exam    External Exam      Right Left   External Normal Normal       Slit Lamp Exam      Right Left   Lids/Lashes Normal Normal   Conjunctiva/Sclera White and quiet White and quiet   Cornea Clear Clear   Anterior Chamber Deep and quiet Deep and quiet   Iris Round and reactive Round and reactive   Lens Posterior chamber intraocular lens Posterior chamber intraocular lens   Anterior  Vitreous Normal Normal       Fundus Exam      Right Left   Posterior Vitreous Posterior vitreous detachment    Disc Normal    C/D Ratio 0.1    Macula Geographic atrophy,,, Drusen, Hard drusen    Vessels Normal    Periphery Normal           IMAGING AND PROCEDURES  Imaging and Procedures for 06/13/20  OCT, Retina - OU - Both Eyes       Right Eye Quality was good. Scan locations included subfoveal. Central Foveal Thickness: 263. Progression has been stable. Findings include abnormal foveal contour, intraretinal fluid, cystoid macular edema, choroidal neovascular membrane.   Left Eye Quality was good. Scan locations included subfoveal. Central Foveal Thickness: 262. Progression has been stable.   Notes OD, overall improved yet still with some intraretinal fluid. this is at 7 weeks today.  Repeat examination OD in 6 weeks and likely intravitreal Avastin repeated then                ASSESSMENT/PLAN:  No problem-specific Assessment & Plan notes found for this encounter.      ICD-10-CM   1. Exudative age-related macular degeneration of right eye with active choroidal neovascularization ([place])  H35.3211 OCT, Retina - OU - Both Eyes    1.OD, overall improved yet still with some intraretinal fluid. this is at 7 weeks today.  Repeat examination OD in 6 weeks and likely intravitreal Avastin repeated then  2.  3.  Ophthalmic Meds Ordered this visit:  No orders of the defined types were placed in this encounter.      No follow-ups on file.  There are no Patient Instructions on file for this visit.   Explained the diagnoses, plan, and follow up with the patient and they expressed understanding.  Patient expressed understanding of the importance of proper follow up care.   Clent Demark Melina Mosteller M.D. Diseases & Surgery of the Retina and Vitreous [facility] 06/13/20     Abbreviations: M myopia (nearsighted); A astigmatism; H hyperopia (farsighted);  P presbyopia; Mrx spectacle prescription;  CTL contact lenses; OD right eye;  OS left eye; OU both eyes  XT exotropia; ET esotropia; PEK punctate epithelial keratitis; PEE punctate epithelial erosions; DES dry eye syndrome; MGD meibomian gland dysfunction; ATs artificial tears; PFAT's preservative free artificial tears; [place] nuclear sclerotic cataract; PSC posterior subcapsular cataract; ERM epi-retinal membrane; PVD posterior vitreous detachment; RD retinal detachment; DM diabetes mellitus; DR diabetic retinopathy; NPDR non-proliferative diabetic retinopathy; PDR proliferative diabetic retinopathy; CSME clinically significant macular edema; DME diabetic macular edema; dbh dot blot hemorrhages; CWS cotton wool spot; POAG primary open angle glaucoma; C/D cup-to-disc ratio; HVF humphrey visual field; GVF goldmann visual field; OCT optical coherence tomography; IOP intraocular pressure; BRVO Branch retinal vein occlusion; CRVO central retinal vein occlusion; CRAO central retinal artery occlusion; BRAO branch retinal artery occlusion; RT retinal tear; SB scleral buckle; PPV pars plana vitrectomy; VH Vitreous hemorrhage; PRP panretinal laser photocoagulation; IVK intravitreal kenalog; VMT vitreomacular traction; MH Macular hole;  NVD neovascularization of the disc; NVE neovascularization elsewhere; AREDS age related eye disease study; ARMD age related macular degeneration; POAG primary open angle glaucoma; EBMD epithelial/anterior basement membrane dystrophy; ACIOL anterior chamber intraocular lens; IOL intraocular lens; PCIOL posterior chamber intraocular lens; Phaco/IOL phacoemulsification with intraocular lens placement; [place] photorefractive keratectomy; LASIK laser assisted in situ keratomileusis; HTN hypertension; DM diabetes mellitus; COPD chronic obstructive pulmonary disease

## 2020-06-13 NOTE — Assessment & Plan Note (Signed)
The nature of wet macular degeneration was discussed with the patient.  Forms of therapy reviewed include the use of Anti-VEGF medications injected painlessly into the eye, as well as other possible treatment modalities, including thermal laser therapy. Fellow eye involvement and risks were discussed with the patient. Upon the finding of wet age related macular degeneration, treatment will be offered. The treatment regimen is on a treat as needed basis with the intent to treat if necessary and extend interval of exams when possible. On average 1 out of 6 patients do not need lifetime therapy. However, the risk of recurrent disease is high for a lifetime.  Initially monthly, then periodic, examinations and evaluations will determine whether the next treatment is required on the day of the examination.  OD still active, will repeat intravitreal Avastin today and examination in 6 weeks

## 2020-06-30 DIAGNOSIS — N3942 Incontinence without sensory awareness: Secondary | ICD-10-CM | POA: Diagnosis not present

## 2020-06-30 DIAGNOSIS — N3946 Mixed incontinence: Secondary | ICD-10-CM | POA: Diagnosis not present

## 2020-06-30 DIAGNOSIS — N390 Urinary tract infection, site not specified: Secondary | ICD-10-CM | POA: Diagnosis not present

## 2020-06-30 DIAGNOSIS — R350 Frequency of micturition: Secondary | ICD-10-CM | POA: Diagnosis not present

## 2020-07-25 ENCOUNTER — Ambulatory Visit (INDEPENDENT_AMBULATORY_CARE_PROVIDER_SITE_OTHER): Payer: PPO | Admitting: Ophthalmology

## 2020-07-25 ENCOUNTER — Encounter (INDEPENDENT_AMBULATORY_CARE_PROVIDER_SITE_OTHER): Payer: PPO | Admitting: Ophthalmology

## 2020-07-25 ENCOUNTER — Encounter (INDEPENDENT_AMBULATORY_CARE_PROVIDER_SITE_OTHER): Payer: Self-pay | Admitting: Ophthalmology

## 2020-07-25 ENCOUNTER — Other Ambulatory Visit: Payer: Self-pay

## 2020-07-25 DIAGNOSIS — H353211 Exudative age-related macular degeneration, right eye, with active choroidal neovascularization: Secondary | ICD-10-CM

## 2020-07-25 MED ORDER — BEVACIZUMAB CHEMO INJECTION 1.25MG/0.05ML SYRINGE FOR KALEIDOSCOPE
1.2500 mg | INTRAVITREAL | Status: AC | PRN
  Administered 2020-07-25: 1.25 mg via INTRAVITREAL

## 2020-07-25 NOTE — Progress Notes (Signed)
07/25/2020     CHIEF COMPLAINT Patient presents for Retina Follow Up   HISTORY OF PRESENT ILLNESS: Kayla Carrillo is a 84 y.o. female who presents to the clinic today for:   HPI    Retina Follow Up    Patient presents with  Wet AMD.  In right eye.  This started 6 weeks ago.  Severity is mild.  Duration of 6 weeks.  Since onset it is stable.          Comments    6 Week AMD F/U OD, poss Avastin OD  Pt reports continued fogginess OD. No new symptoms reported OU.       Last edited by Ileana Roup, COA on 07/25/2020  9:46 AM. (History)      Referring physician: [name], MD [address] [place],  [place] [address]  HISTORICAL INFORMATION:   Selected notes from the MEDICAL RECORD NUMBER       CURRENT MEDICATIONS: No current outpatient medications on file. (Ophthalmic Drugs)   No current facility-administered medications for this visit. (Ophthalmic Drugs)   Current Outpatient Medications (Other)  Medication Sig   Ascorbic Acid (VITAMIN C) 100 MG tablet Take 100 mg by mouth daily.   aspirin EC 81 MG tablet Take 81 mg by mouth daily.   carvedilol (COREG) 6.25 MG tablet Take 6.25 mg by mouth 2 (two) times daily with a meal.   GLUCOSAMINE-CALCIUM-VIT D PO Take by mouth.   meclizine (ANTIVERT) 25 MG tablet Take 25 mg by mouth 3 (three) times daily as needed for dizziness.   meloxicam (MOBIC) 15 MG tablet Take 15 mg by mouth daily.   Omega-3 Fatty Acids (FISH OIL) 1000 MG CAPS Take 1 capsule by mouth daily.    omeprazole (PRILOSEC) 20 MG capsule Take 20 mg by mouth daily.   promethazine-dextromethorphan (PROMETHAZINE-DM) 6.25-15 MG/5ML syrup Take 5 mLs by mouth 4 (four) times daily as needed for cough.   sertraline (ZOLOFT) 50 MG tablet Take 50 mg by mouth daily.   simvastatin (ZOCOR) 40 MG tablet Take 40 mg by mouth daily.   No current facility-administered medications for this visit. (Other)      REVIEW OF  SYSTEMS:    ALLERGIES Allergies  Allergen Reactions   Codeine Sulfate [Codeine] Other (See Comments)    drowsey   Sulfa Antibiotics Other (See Comments)    drowsey    PAST MEDICAL HISTORY Past Medical History:  Diagnosis Date   Cardiomyopathy (HCC)    Takotsubo   Depression with anxiety    Dyslipidemia    GERD (gastroesophageal reflux disease)    Past Surgical History:  Procedure Laterality Date   CHOLECYSTECTOMY     LEG SURGERY     Metal rod left leg   TOTAL KNEE ARTHROPLASTY     Bilateral    FAMILY HISTORY History reviewed. No pertinent family history.  SOCIAL HISTORY Social History   Tobacco Use   Smoking status: Never Smoker   Smokeless tobacco: Never Used  Substance Use Topics   Alcohol use: Not on file   Drug use: Not on file         OPHTHALMIC EXAM:  Base Eye Exam    Visual Acuity (ETDRS)      Right Left   Dist [place] 20/50 -2 20/40 +1   Dist ph [place] NI 20/30 +2       Tonometry (Tonopen, 9:50 AM)      Right Left   Pressure 12 15  Pupils      Pupils Dark Light Shape React APD   Right PERRL 4 3 Round Brisk None   Left PERRL 4 3 Round Brisk None       Visual Fields (Counting fingers)      Left Right    Full Full       Extraocular Movement      Right Left    Full Full       Neuro/Psych    Oriented x3: Yes   Mood/Affect: Normal       Dilation    Right eye: 1.0% Mydriacyl, 2.5% Phenylephrine @ 9:50 AM        Slit Lamp and Fundus Exam    External Exam      Right Left   External Normal Normal       Slit Lamp Exam      Right Left   Lids/Lashes Normal Normal   Conjunctiva/Sclera White and quiet White and quiet   Cornea Clear Clear   Anterior Chamber Deep and quiet Deep and quiet   Iris Round and reactive Round and reactive   Lens Posterior chamber intraocular lens Posterior chamber intraocular lens   Anterior Vitreous Normal Normal       Fundus Exam      Right Left   Posterior Vitreous Posterior  vitreous detachment    Disc Normal    C/D Ratio 0.1    Macula Geographic atrophy,,, Drusen, Hard drusen    Vessels Normal    Periphery Normal           IMAGING AND PROCEDURES  Imaging and Procedures for 07/25/20  OCT, Retina - OU - Both Eyes       Right Eye Quality was good. Scan locations included subfoveal. Central Foveal Thickness: 248. Findings include central retinal atrophy, outer retinal atrophy, subretinal scarring.   Left Eye Quality was good. Scan locations included subfoveal. Central Foveal Thickness: 255. Findings include no SRF, no IRF.   Notes OD, with much less subretinal fluid currently at 6-week interval post intravitreal Avastin And  preservation of vision       Intravitreal Injection, Pharmacologic Agent - OD - Right Eye       Time Out 07/25/2020. 10:44 AM. Confirmed correct patient, procedure, site, and patient consented.   Anesthesia Topical anesthesia was used. Anesthetic medications included Akten 3.5%.   Procedure Preparation included 10% betadine to eyelids, 5% betadine to ocular surface, Tobramycin 0.3%, Ofloxacin . A 30 gauge needle was used.   Injection:  1.25 mg Bevacizumab (AVASTIN) SOLN   NDC: [address]-0   Route: Intravitreal, Site: Right Eye, Waste: 0 mg  Post-op Post injection exam found visual acuity of at least counting fingers. The patient tolerated the procedure well. There were no complications. The patient received written and verbal post procedure care education. Post injection medications were not given.                 ASSESSMENT/PLAN:  Exudative age-related macular degeneration of right eye with active choroidal neovascularization (HCC) The OD has improved on intravitreal Avastin will repeat today and examination in 6 weeks.  Patient reports requiring staying local to [facility] because of her transportation needs.      ICD-10-CM   1. Exudative age-related macular degeneration of right eye with active  choroidal neovascularization (HCC)  H35.3211 OCT, Retina - OU - Both Eyes    Intravitreal Injection, Pharmacologic Agent - OD - Right Eye    Bevacizumab (AVASTIN) SOLN 1.25  mg    1.  2.  3.  Ophthalmic Meds Ordered this visit:  Meds ordered this encounter  Medications   Bevacizumab (AVASTIN) SOLN 1.25 mg       Return in about 6 weeks (around 09/05/2020) for DILATE OU, AVASTIN OCT, OD.  There are no Patient Instructions on file for this visit.   Explained the diagnoses, plan, and follow up with the patient and they expressed understanding.  Patient expressed understanding of the importance of proper follow up care.   Alford Highland Tamar Miano M.D. Diseases & Surgery of the Retina and Vitreous [facility] 07/25/20     Abbreviations: M myopia (nearsighted); A astigmatism; H hyperopia (farsighted); P presbyopia; Mrx spectacle prescription;  CTL contact lenses; OD right eye; OS left eye; OU both eyes  XT exotropia; ET esotropia; PEK punctate epithelial keratitis; PEE punctate epithelial erosions; DES dry eye syndrome; MGD meibomian gland dysfunction; ATs artificial tears; PFAT's preservative free artificial tears; NSC nuclear sclerotic cataract; PSC posterior subcapsular cataract; ERM epi-retinal membrane; PVD posterior vitreous detachment; RD retinal detachment; DM diabetes mellitus; DR diabetic retinopathy; NPDR non-proliferative diabetic retinopathy; PDR proliferative diabetic retinopathy; CSME clinically significant macular edema; DME diabetic macular edema; dbh dot blot hemorrhages; CWS cotton wool spot; POAG primary open angle glaucoma; C/D cup-to-disc ratio; HVF humphrey visual field; GVF goldmann visual field; OCT optical coherence tomography; IOP intraocular pressure; BRVO Branch retinal vein occlusion; CRVO central retinal vein occlusion; CRAO central retinal artery occlusion; BRAO branch retinal artery occlusion; RT retinal tear; SB scleral buckle; PPV pars plana  vitrectomy; VH Vitreous hemorrhage; PRP panretinal laser photocoagulation; IVK intravitreal kenalog; VMT vitreomacular traction; MH Macular hole;  NVD neovascularization of the disc; NVE neovascularization elsewhere; AREDS age related eye disease study; ARMD age related macular degeneration; POAG primary open angle glaucoma; EBMD epithelial/anterior basement membrane dystrophy; ACIOL anterior chamber intraocular lens; IOL intraocular lens; PCIOL posterior chamber intraocular lens; Phaco/IOL phacoemulsification with intraocular lens placement; PRK photorefractive keratectomy; LASIK laser assisted in situ keratomileusis; HTN hypertension; DM diabetes mellitus; COPD chronic obstructive pulmonary disease

## 2020-07-25 NOTE — Assessment & Plan Note (Signed)
The OD has improved on intravitreal Avastin will repeat today and examination in 6 weeks.  Patient reports requiring staying local to [facility] because of her transportation needs.

## 2020-07-26 DIAGNOSIS — R413 Other amnesia: Secondary | ICD-10-CM | POA: Diagnosis not present

## 2020-07-26 DIAGNOSIS — Z0001 Encounter for general adult medical examination with abnormal findings: Secondary | ICD-10-CM | POA: Diagnosis not present

## 2020-07-26 DIAGNOSIS — E7800 Pure hypercholesterolemia, unspecified: Secondary | ICD-10-CM | POA: Diagnosis not present

## 2020-07-26 DIAGNOSIS — F321 Major depressive disorder, single episode, moderate: Secondary | ICD-10-CM | POA: Diagnosis not present

## 2020-07-26 DIAGNOSIS — E2839 Other primary ovarian failure: Secondary | ICD-10-CM | POA: Diagnosis not present

## 2020-07-26 DIAGNOSIS — Z79899 Other long term (current) drug therapy: Secondary | ICD-10-CM | POA: Diagnosis not present

## 2020-07-26 DIAGNOSIS — I5181 Takotsubo syndrome: Secondary | ICD-10-CM | POA: Diagnosis not present

## 2020-07-26 DIAGNOSIS — I5022 Chronic systolic (congestive) heart failure: Secondary | ICD-10-CM | POA: Diagnosis not present

## 2020-07-31 ENCOUNTER — Other Ambulatory Visit: Payer: Self-pay | Admitting: Family Medicine

## 2020-07-31 DIAGNOSIS — E2839 Other primary ovarian failure: Secondary | ICD-10-CM

## 2020-08-07 ENCOUNTER — Encounter: Payer: Self-pay | Admitting: Neurology

## 2020-08-18 DIAGNOSIS — N3946 Mixed incontinence: Secondary | ICD-10-CM | POA: Diagnosis not present

## 2020-08-29 DIAGNOSIS — H353211 Exudative age-related macular degeneration, right eye, with active choroidal neovascularization: Secondary | ICD-10-CM | POA: Diagnosis not present

## 2020-08-29 DIAGNOSIS — H35363 Drusen (degenerative) of macula, bilateral: Secondary | ICD-10-CM | POA: Diagnosis not present

## 2020-08-29 DIAGNOSIS — H35453 Secondary pigmentary degeneration, bilateral: Secondary | ICD-10-CM | POA: Diagnosis not present

## 2020-08-29 DIAGNOSIS — H353121 Nonexudative age-related macular degeneration, left eye, early dry stage: Secondary | ICD-10-CM | POA: Diagnosis not present

## 2020-08-29 DIAGNOSIS — Z961 Presence of intraocular lens: Secondary | ICD-10-CM | POA: Diagnosis not present

## 2020-08-29 DIAGNOSIS — H5315 Visual distortions of shape and size: Secondary | ICD-10-CM | POA: Diagnosis not present

## 2020-09-05 ENCOUNTER — Encounter (INDEPENDENT_AMBULATORY_CARE_PROVIDER_SITE_OTHER): Payer: PPO | Admitting: Ophthalmology

## 2020-09-12 DIAGNOSIS — H353211 Exudative age-related macular degeneration, right eye, with active choroidal neovascularization: Secondary | ICD-10-CM | POA: Diagnosis not present

## 2020-09-29 DIAGNOSIS — N3946 Mixed incontinence: Secondary | ICD-10-CM | POA: Diagnosis not present

## 2020-09-29 DIAGNOSIS — N3942 Incontinence without sensory awareness: Secondary | ICD-10-CM | POA: Diagnosis not present

## 2020-10-17 DIAGNOSIS — H353211 Exudative age-related macular degeneration, right eye, with active choroidal neovascularization: Secondary | ICD-10-CM | POA: Diagnosis not present

## 2020-10-21 ENCOUNTER — Emergency Department (HOSPITAL_BASED_OUTPATIENT_CLINIC_OR_DEPARTMENT_OTHER): Payer: PPO

## 2020-10-21 ENCOUNTER — Telehealth (HOSPITAL_BASED_OUTPATIENT_CLINIC_OR_DEPARTMENT_OTHER): Payer: Self-pay | Admitting: Emergency Medicine

## 2020-10-21 ENCOUNTER — Encounter (HOSPITAL_BASED_OUTPATIENT_CLINIC_OR_DEPARTMENT_OTHER): Payer: Self-pay

## 2020-10-21 ENCOUNTER — Other Ambulatory Visit: Payer: Self-pay

## 2020-10-21 ENCOUNTER — Inpatient Hospital Stay (HOSPITAL_BASED_OUTPATIENT_CLINIC_OR_DEPARTMENT_OTHER)
Admission: EM | Admit: 2020-10-21 | Discharge: 2020-10-26 | DRG: 493 | Disposition: A | Discharge status: Skilled Nursing Facility | Payer: PPO | Attending: Internal Medicine | Admitting: Internal Medicine

## 2020-10-21 DIAGNOSIS — I110 Hypertensive heart disease with heart failure: Secondary | ICD-10-CM | POA: Diagnosis present

## 2020-10-21 DIAGNOSIS — Z791 Long term (current) use of non-steroidal anti-inflammatories (NSAID): Secondary | ICD-10-CM | POA: Diagnosis not present

## 2020-10-21 DIAGNOSIS — T148XXA Other injury of unspecified body region, initial encounter: Secondary | ICD-10-CM

## 2020-10-21 DIAGNOSIS — D72828 Other elevated white blood cell count: Secondary | ICD-10-CM | POA: Diagnosis not present

## 2020-10-21 DIAGNOSIS — S52022A Displaced fracture of olecranon process without intraarticular extension of left ulna, initial encounter for closed fracture: Secondary | ICD-10-CM | POA: Diagnosis present

## 2020-10-21 DIAGNOSIS — S0990XA Unspecified injury of head, initial encounter: Secondary | ICD-10-CM | POA: Diagnosis not present

## 2020-10-21 DIAGNOSIS — E782 Mixed hyperlipidemia: Secondary | ICD-10-CM | POA: Diagnosis not present

## 2020-10-21 DIAGNOSIS — D72829 Elevated white blood cell count, unspecified: Secondary | ICD-10-CM | POA: Diagnosis not present

## 2020-10-21 DIAGNOSIS — F418 Other specified anxiety disorders: Secondary | ICD-10-CM | POA: Diagnosis not present

## 2020-10-21 DIAGNOSIS — Z96659 Presence of unspecified artificial knee joint: Secondary | ICD-10-CM | POA: Diagnosis not present

## 2020-10-21 DIAGNOSIS — Z20822 Contact with and (suspected) exposure to covid-19: Secondary | ICD-10-CM | POA: Diagnosis not present

## 2020-10-21 DIAGNOSIS — F05 Delirium due to known physiological condition: Secondary | ICD-10-CM | POA: Diagnosis not present

## 2020-10-21 DIAGNOSIS — I6529 Occlusion and stenosis of unspecified carotid artery: Secondary | ICD-10-CM | POA: Diagnosis not present

## 2020-10-21 DIAGNOSIS — E8889 Other specified metabolic disorders: Secondary | ICD-10-CM | POA: Diagnosis not present

## 2020-10-21 DIAGNOSIS — I5042 Chronic combined systolic (congestive) and diastolic (congestive) heart failure: Secondary | ICD-10-CM | POA: Diagnosis present

## 2020-10-21 DIAGNOSIS — U071 COVID-19: Secondary | ICD-10-CM | POA: Diagnosis not present

## 2020-10-21 DIAGNOSIS — G319 Degenerative disease of nervous system, unspecified: Secondary | ICD-10-CM | POA: Diagnosis not present

## 2020-10-21 DIAGNOSIS — S42402A Unspecified fracture of lower end of left humerus, initial encounter for closed fracture: Secondary | ICD-10-CM

## 2020-10-21 DIAGNOSIS — W19XXXA Unspecified fall, initial encounter: Secondary | ICD-10-CM | POA: Diagnosis not present

## 2020-10-21 DIAGNOSIS — R531 Weakness: Secondary | ICD-10-CM | POA: Diagnosis not present

## 2020-10-21 DIAGNOSIS — Z79899 Other long term (current) drug therapy: Secondary | ICD-10-CM | POA: Diagnosis not present

## 2020-10-21 DIAGNOSIS — Z885 Allergy status to narcotic agent status: Secondary | ICD-10-CM

## 2020-10-21 DIAGNOSIS — Y92009 Unspecified place in unspecified non-institutional (private) residence as the place of occurrence of the external cause: Secondary | ICD-10-CM

## 2020-10-21 DIAGNOSIS — I440 Atrioventricular block, first degree: Secondary | ICD-10-CM | POA: Diagnosis present

## 2020-10-21 DIAGNOSIS — S42402D Unspecified fracture of lower end of left humerus, subsequent encounter for fracture with routine healing: Secondary | ICD-10-CM | POA: Diagnosis not present

## 2020-10-21 DIAGNOSIS — I10 Essential (primary) hypertension: Secondary | ICD-10-CM

## 2020-10-21 DIAGNOSIS — S0181XA Laceration without foreign body of other part of head, initial encounter: Secondary | ICD-10-CM | POA: Diagnosis not present

## 2020-10-21 DIAGNOSIS — R41841 Cognitive communication deficit: Secondary | ICD-10-CM | POA: Diagnosis not present

## 2020-10-21 DIAGNOSIS — M6281 Muscle weakness (generalized): Secondary | ICD-10-CM | POA: Diagnosis not present

## 2020-10-21 DIAGNOSIS — Z9181 History of falling: Secondary | ICD-10-CM | POA: Diagnosis not present

## 2020-10-21 DIAGNOSIS — Z01810 Encounter for preprocedural cardiovascular examination: Secondary | ICD-10-CM | POA: Diagnosis not present

## 2020-10-21 DIAGNOSIS — T1490XA Injury, unspecified, initial encounter: Secondary | ICD-10-CM | POA: Diagnosis not present

## 2020-10-21 DIAGNOSIS — Z7401 Bed confinement status: Secondary | ICD-10-CM | POA: Diagnosis not present

## 2020-10-21 DIAGNOSIS — M25521 Pain in right elbow: Secondary | ICD-10-CM | POA: Diagnosis not present

## 2020-10-21 DIAGNOSIS — R262 Difficulty in walking, not elsewhere classified: Secondary | ICD-10-CM | POA: Diagnosis present

## 2020-10-21 DIAGNOSIS — E559 Vitamin D deficiency, unspecified: Secondary | ICD-10-CM | POA: Diagnosis not present

## 2020-10-21 DIAGNOSIS — Z7982 Long term (current) use of aspirin: Secondary | ICD-10-CM

## 2020-10-21 DIAGNOSIS — I959 Hypotension, unspecified: Secondary | ICD-10-CM | POA: Diagnosis not present

## 2020-10-21 DIAGNOSIS — S52022D Displaced fracture of olecranon process without intraarticular extension of left ulna, subsequent encounter for closed fracture with routine healing: Secondary | ICD-10-CM | POA: Diagnosis not present

## 2020-10-21 DIAGNOSIS — I447 Left bundle-branch block, unspecified: Secondary | ICD-10-CM | POA: Diagnosis not present

## 2020-10-21 DIAGNOSIS — M778 Other enthesopathies, not elsewhere classified: Secondary | ICD-10-CM | POA: Diagnosis not present

## 2020-10-21 DIAGNOSIS — Z882 Allergy status to sulfonamides status: Secondary | ICD-10-CM

## 2020-10-21 DIAGNOSIS — S42471A Displaced transcondylar fracture of right humerus, initial encounter for closed fracture: Principal | ICD-10-CM | POA: Diagnosis present

## 2020-10-21 DIAGNOSIS — S42471D Displaced transcondylar fracture of right humerus, subsequent encounter for fracture with routine healing: Secondary | ICD-10-CM | POA: Diagnosis not present

## 2020-10-21 DIAGNOSIS — Y92039 Unspecified place in apartment as the place of occurrence of the external cause: Secondary | ICD-10-CM | POA: Diagnosis not present

## 2020-10-21 DIAGNOSIS — E785 Hyperlipidemia, unspecified: Secondary | ICD-10-CM | POA: Diagnosis present

## 2020-10-21 DIAGNOSIS — Y9301 Activity, walking, marching and hiking: Secondary | ICD-10-CM | POA: Diagnosis present

## 2020-10-21 DIAGNOSIS — Z419 Encounter for procedure for purposes other than remedying health state, unspecified: Secondary | ICD-10-CM

## 2020-10-21 DIAGNOSIS — I5022 Chronic systolic (congestive) heart failure: Secondary | ICD-10-CM

## 2020-10-21 DIAGNOSIS — S42401A Unspecified fracture of lower end of right humerus, initial encounter for closed fracture: Secondary | ICD-10-CM | POA: Diagnosis not present

## 2020-10-21 DIAGNOSIS — M2550 Pain in unspecified joint: Secondary | ICD-10-CM | POA: Diagnosis not present

## 2020-10-21 DIAGNOSIS — K219 Gastro-esophageal reflux disease without esophagitis: Secondary | ICD-10-CM | POA: Diagnosis present

## 2020-10-21 DIAGNOSIS — W010XXA Fall on same level from slipping, tripping and stumbling without subsequent striking against object, initial encounter: Secondary | ICD-10-CM | POA: Diagnosis present

## 2020-10-21 DIAGNOSIS — S52021A Displaced fracture of olecranon process without intraarticular extension of right ulna, initial encounter for closed fracture: Secondary | ICD-10-CM | POA: Diagnosis not present

## 2020-10-21 DIAGNOSIS — R9431 Abnormal electrocardiogram [ECG] [EKG]: Secondary | ICD-10-CM | POA: Diagnosis not present

## 2020-10-21 HISTORY — DX: Essential (primary) hypertension: I10

## 2020-10-21 HISTORY — DX: Displaced fracture of olecranon process without intraarticular extension of left ulna, initial encounter for closed fracture: S52.022A

## 2020-10-21 HISTORY — DX: Displaced transcondylar fracture of right humerus, initial encounter for closed fracture: S42.471A

## 2020-10-21 LAB — CBC
HCT: 40.5 % (ref 36.0–46.0)
Hemoglobin: 13.1 g/dL (ref 12.0–15.0)
MCH: 28.5 pg (ref 26.0–34.0)
MCHC: 32.3 g/dL (ref 30.0–36.0)
MCV: 88 fL (ref 80.0–100.0)
Platelets: 202 10*3/uL (ref 150–400)
RBC: 4.6 MIL/uL (ref 3.87–5.11)
RDW: 13.8 % (ref 11.5–15.5)
WBC: 12.6 10*3/uL — ABNORMAL HIGH (ref 4.0–10.5)
nRBC: 0 % (ref 0.0–0.2)

## 2020-10-21 LAB — RESPIRATORY PANEL BY RT PCR (FLU A&B, COVID)
Influenza A by PCR: NEGATIVE
Influenza B by PCR: NEGATIVE
SARS Coronavirus 2 by RT PCR: NEGATIVE

## 2020-10-21 LAB — BASIC METABOLIC PANEL
Anion gap: 10 (ref 5–15)
BUN: 18 mg/dL (ref 8–23)
CO2: 24 mmol/L (ref 22–32)
Calcium: 8.7 mg/dL — ABNORMAL LOW (ref 8.9–10.3)
Chloride: 106 mmol/L (ref 98–111)
Creatinine, Ser: 0.83 mg/dL (ref 0.44–1.00)
GFR, Estimated: >60 mL/min (ref >60)
Glucose, Bld: 109 mg/dL — ABNORMAL HIGH (ref 70–99)
Potassium: 4.2 mmol/L (ref 3.5–5.1)
Sodium: 140 mmol/L (ref 135–145)

## 2020-10-21 MED ORDER — FENTANYL CITRATE (PF) 100 MCG/2ML IJ SOLN
50.0000 ug | Freq: Once | INTRAMUSCULAR | Status: AC
Start: 2020-10-21 — End: 2020-10-21

## 2020-10-21 MED ORDER — ONDANSETRON HCL 4 MG/2ML IJ SOLN
INTRAMUSCULAR | Status: AC
  Administered 2020-10-21: 4 mg via INTRAVENOUS
  Filled 2020-10-21: qty 2

## 2020-10-21 MED ORDER — MECLIZINE HCL 12.5 MG PO TABS: 25.0000 mg | ORAL_TABLET | Freq: Three times a day (TID) | ORAL | Status: DC | PRN

## 2020-10-21 MED ORDER — LACTATED RINGERS IV SOLN: INTRAVENOUS | Status: DC

## 2020-10-21 MED ORDER — FENTANYL CITRATE (PF) 100 MCG/2ML IJ SOLN
INTRAMUSCULAR | Status: AC
  Administered 2020-10-21: 50 ug via INTRAVENOUS
  Filled 2020-10-21: qty 2

## 2020-10-21 MED ORDER — ONDANSETRON HCL 4 MG/2ML IJ SOLN
4.0000 mg | Freq: Once | INTRAMUSCULAR | Status: AC
Start: 2020-10-21 — End: 2020-10-21

## 2020-10-21 MED ORDER — SIMVASTATIN 20 MG PO TABS
40.0000 mg | ORAL_TABLET | Freq: Every day | ORAL | Status: DC
  Administered 2020-10-22 – 2020-10-26 (×5): 40 mg via ORAL
  Filled 2020-10-21 (×5): qty 2

## 2020-10-21 MED ORDER — CARVEDILOL 6.25 MG PO TABS
6.2500 mg | ORAL_TABLET | Freq: Two times a day (BID) | ORAL | Status: DC
  Administered 2020-10-22 – 2020-10-26 (×8): 6.25 mg via ORAL
  Filled 2020-10-21 (×8): qty 1

## 2020-10-21 MED ORDER — HYDRALAZINE HCL 20 MG/ML IJ SOLN
10.0000 mg | Freq: Four times a day (QID) | INTRAMUSCULAR | Status: DC | PRN
  Administered 2020-10-24: 10 mg via INTRAVENOUS
  Filled 2020-10-21: qty 1

## 2020-10-21 MED ORDER — MORPHINE SULFATE (PF) 2 MG/ML IV SOLN
2.0000 mg | INTRAVENOUS | Status: DC | PRN
  Administered 2020-10-21 – 2020-10-23 (×4): 2 mg via INTRAVENOUS
  Filled 2020-10-21 (×4): qty 1

## 2020-10-21 MED ORDER — ACETAMINOPHEN 650 MG RE SUPP: 650.0000 mg | Freq: Four times a day (QID) | RECTAL | Status: DC | PRN

## 2020-10-21 MED ORDER — PANTOPRAZOLE SODIUM 40 MG PO TBEC
40.0000 mg | DELAYED_RELEASE_TABLET | Freq: Every day | ORAL | Status: DC
  Administered 2020-10-22 – 2020-10-26 (×5): 40 mg via ORAL
  Filled 2020-10-21 (×5): qty 1

## 2020-10-21 MED ORDER — POLYETHYLENE GLYCOL 3350 17 G PO PACK: 17.0000 g | PACK | Freq: Every day | ORAL | Status: DC | PRN

## 2020-10-21 MED ORDER — ONDANSETRON HCL 4 MG PO TABS: 4.0000 mg | ORAL_TABLET | Freq: Four times a day (QID) | ORAL | Status: DC | PRN

## 2020-10-21 MED ORDER — MORPHINE SULFATE (PF) 4 MG/ML IV SOLN
4.0000 mg | INTRAVENOUS | Status: DC | PRN
  Administered 2020-10-24: 4 mg via INTRAVENOUS
  Filled 2020-10-21: qty 1

## 2020-10-21 MED ORDER — ACETAMINOPHEN 325 MG PO TABS
650.0000 mg | ORAL_TABLET | Freq: Four times a day (QID) | ORAL | Status: DC | PRN
  Administered 2020-10-22 – 2020-10-24 (×3): 650 mg via ORAL
  Filled 2020-10-21 (×3): qty 2

## 2020-10-21 MED ORDER — ONDANSETRON HCL 4 MG/2ML IJ SOLN: 4.0000 mg | Freq: Four times a day (QID) | INTRAMUSCULAR | Status: DC | PRN

## 2020-10-21 MED ORDER — SERTRALINE HCL 50 MG PO TABS
50.0000 mg | ORAL_TABLET | Freq: Every day | ORAL | Status: DC
  Administered 2020-10-22 – 2020-10-26 (×5): 50 mg via ORAL
  Filled 2020-10-21 (×5): qty 1

## 2020-10-21 NOTE — ED Triage Notes (Signed)
Pt arrived from an independent living facility after a fall. Pt states her feet slipped out from under her and she landed on her bottom and hit both of her elbows. Pt c.o bilateral elbow pain along with a laceration above the R eye.

## 2020-10-21 NOTE — ED Provider Notes (Signed)
[facility] EMERGENCY DEPARTMENT Provider Note  CSN: [id number] Arrival date & time: 10/21/20 1622    History Chief Complaint  Patient presents with  . Fall    HPI  Kayla Carrillo is a 84 y.o. female brought to the ED via EMS from her independently living facility where she had a witnssed fall while in a group setting listening to some music. She states she got her feet caught on something, fell into another resident and then onto the floor. She did not have LOC. She is complaining primarily of bilateral elbow pain. Worse with movement. She also sustained a small laceration above her R eyebrow.    Past Medical History:  Diagnosis Date  . Cardiomyopathy (HCC)    Takotsubo  . Depression with anxiety   . Dyslipidemia   . GERD (gastroesophageal reflux disease)     Past Surgical History:  Procedure Laterality Date  . CHOLECYSTECTOMY    . LEG SURGERY     Metal rod left leg  . TOTAL KNEE ARTHROPLASTY     Bilateral    History reviewed. No pertinent family history.  Social History   Tobacco Use  . Smoking status: Never Smoker  . Smokeless tobacco: Never Used  Substance Use Topics  . Alcohol use: Not on file  . Drug use: Not on file     Home Medications Prior to Admission medications   Medication Sig Start Date End Date Taking? Authorizing Provider  Ascorbic Acid (VITAMIN C) 100 MG tablet Take 100 mg by mouth daily.    [provider]  aspirin EC 81 MG tablet Take 81 mg by mouth daily.    [provider]  carvedilol (COREG) 6.25 MG tablet Take 6.25 mg by mouth 2 (two) times daily with a meal.    [provider]  GLUCOSAMINE-CALCIUM-VIT D PO Take by mouth.    [provider]  meclizine (ANTIVERT) 25 MG tablet Take 25 mg by mouth 3 (three) times daily as needed for dizziness.    [provider]  meloxicam (MOBIC) 15 MG tablet Take 15 mg by mouth daily.    [provider]  Omega-3 Fatty Acids (FISH  OIL) 1000 MG CAPS Take 1 capsule by mouth daily.     [provider]  omeprazole (PRILOSEC) 20 MG capsule Take 20 mg by mouth daily.    [provider]  promethazine-dextromethorphan (PROMETHAZINE-DM) 6.25-15 MG/5ML syrup Take 5 mLs by mouth 4 (four) times daily as needed for cough.    [provider]  sertraline (ZOLOFT) 50 MG tablet Take 50 mg by mouth daily.    [provider]  simvastatin (ZOCOR) 40 MG tablet Take 40 mg by mouth daily.    [provider]     Allergies    Codeine sulfate [codeine] and Sulfa antibiotics   Review of Systems   Review of Systems A comprehensive review of systems was completed and negative except as noted in HPI.    Physical Exam BP (!) 177/76 (BP Location: Right Leg)   Pulse 79   Temp 98 F (36.7 C) (Oral)   Resp 18   Ht 5\' 3"  (1.6 m)   Wt 82.1 kg   SpO2 95%   BMI 32.06 kg/m   Physical Exam Vitals and nursing note reviewed.  Constitutional:      Appearance: Normal appearance.  HENT:     Head: Normocephalic.     Comments: Small, <1cm superficial laceration/abrasion above R eyebrow    Nose:  Nose normal.     Mouth/Throat:     Mouth: Mucous membranes are moist.  Eyes:     Extraocular Movements: Extraocular movements intact.     Conjunctiva/sclera: Conjunctivae normal.  Cardiovascular:     Rate and Rhythm: Normal rate.  Pulmonary:     Effort: Pulmonary effort is normal.     Breath sounds: Normal breath sounds.  Abdominal:     General: Abdomen is flat.     Palpations: Abdomen is soft.     Tenderness: There is no abdominal tenderness.  Musculoskeletal:        General: Swelling and tenderness present.     Cervical back: Neck supple.     Comments: Swelling and tenderness to both elbows, ROM reduced by pain. Mild crepitus on both elbows with ROM. Distally NVI  Skin:    General: Skin is warm and dry.  Neurological:     General: No focal deficit present.     Mental Status: She is alert.    Psychiatric:        Mood and Affect: Mood normal.      ED Results / Procedures / Treatments   Labs (all labs ordered are listed, but only abnormal results are displayed) Labs Reviewed  CBC - Abnormal; Notable for the following components:      Result Value   WBC 12.6 (*)    All other components within normal limits  BASIC METABOLIC PANEL - Abnormal; Notable for the following components:   Glucose, Bld 109 (*)    Calcium 8.7 (*)    All other components within normal limits  RESPIRATORY PANEL BY RT PCR (FLU A&B, COVID)  URINALYSIS, ROUTINE W REFLEX MICROSCOPIC    EKG None   Radiology DG Elbow Complete Left  Result Date: 10/21/2020 CLINICAL DATA:  Left elbow pain after fall EXAM: LEFT ELBOW - COMPLETE 3+ VIEW COMPARISON:  None. FINDINGS: Acute fracture of the olecranon process of the proximal ulna with 4 mm of fracture distraction. Fracture extends intra-articularly to the ulnotrochlear joint. There is a small elbow joint hemarthrosis. No additional fractures are identified. There is prominence of the radial tuberosity, nonspecific. Degenerative changes of the elbow joint. Enthesopathic changes at the medial and lateral humeral epicondyles. Prominent soft tissue swelling/hematoma overlying the fracture site at the posterior elbow. IMPRESSION: Left elbow: 1. Acute fracture of the olecranon process of the proximal ulna with 4 mm of fracture distraction. 2. Prominent soft tissue swelling/hematoma overlying the fracture site at the fracture site. Electronically Signed   By: Duanne Guess D.O.   On: 10/21/2020 17:31   DG Elbow Complete Right  Result Date: 10/21/2020 CLINICAL DATA:  Fall, right elbow pain EXAM: RIGHT ELBOW - COMPLETE 3+ VIEW COMPARISON:  None. FINDINGS: Acute transcondylar fracture of the distal right humerus with intra-articular extension. Slight anterior displacement and angulation at the fracture site. No fractures identified involving the proximal radius or ulna.  There is a large elbow joint hemarthrosis. Diffuse soft tissue swelling. IMPRESSION: Right elbow: Acute transcondylar fracture of the distal right humerus with intra-articular extension and slight anterior displacement and angulation. Electronically Signed   By: Duanne Guess D.O.   On: 10/21/2020 17:35   CT Head Wo Contrast  Result Date: 10/21/2020 CLINICAL DATA:  84 year old female with fall and head injury today. EXAM: CT HEAD WITHOUT CONTRAST TECHNIQUE: Contiguous axial images were obtained from the base of the skull through the vertex without intravenous contrast. COMPARISON:  None. FINDINGS: Brain: No evidence of acute infarction, hemorrhage,  hydrocephalus, extra-axial collection or mass lesion/mass effect. Moderate periventricular white matter hypodensities are of uncertain chronicity but most likely represent chronic small-vessel white matter ischemic changes. Mild generalized cerebral atrophy is noted. Vascular: Carotid atherosclerotic calcifications are noted. Skull: Normal. Negative for fracture or focal lesion. Sinuses/Orbits: No acute finding. Other: None. IMPRESSION: 1. No evidence of acute intracranial abnormality. 2. Moderate periventricular white matter hypodensities - likely chronic small-vessel white matter ischemic changes. 3. Mild generalized cerebral atrophy. Electronically Signed   By: Harmon Pier M.D.   On: 10/21/2020 17:31    Procedures Procedures  Medications Ordered in the ED Medications  ondansetron [employer] [facility]) injection 4 mg (4 mg Intravenous Given 10/21/20 2035)  fentaNYL (SUBLIMAZE) injection 50 mcg (50 mcg Intravenous Given 10/21/20 2033)     MDM Rules/Calculators/A&P MDM Patient with mechanical fall, has a small forehead abrasion and bilateral elbow pain. Will send for imaging of injured areas.  ED Course  I have reviewed the triage vital signs and the nursing notes.  Pertinent labs & imaging results that were available during my care of the patient were  reviewed by me and considered in my medical decision making (see chart for details).  Clinical Course as of Oct 22 2139  Sat Oct 21, 2020  1751 Xray images reviewed, both elbows are fractured. Will discuss with Ortho.    [CS]  1824 Spoke with Dr. Ophelia Charter, Orthopedics, who recommends that due to two fractured extremities, she will need to be admitted to Medicine service until he can arrange for the patient's fractures to be repaired, it will likely require two surgeons. He requests the patient go to [facility].Hospitalist paged.    [CS]  1847 Spoke with Dr. Chipper Herb, Hospitalist, who will evaluate for admission.    [CS]    Clinical Course User Index [CS] Pollyann Savoy, MD    Final Clinical Impression(s) / ED Diagnoses Final diagnoses:  Fall, initial encounter  Closed fracture of both elbows, initial encounter    Rx / DC Orders ED Discharge Orders    None       Pollyann Savoy, MD 10/21/20 2140

## 2020-10-21 NOTE — Progress Notes (Signed)
Received the patient from [facility], via carelink, alert and oriented, placed on Tele and verified, paged Admissions for new orders, call light in place, personal items in reach   10/21/20 2131  Vitals  Temp 98 F (36.7 C)  Temp Source Oral  BP (!) 177/76  MAP (mmHg) 101  BP Location Right Leg  BP Method Automatic  Patient Position (if appropriate) Lying  Pulse Rate 79  Pulse Rate Source Monitor  Resp 18  MEWS COLOR  MEWS Score Color Green  Oxygen Therapy  SpO2 95 %  O2 Device Room Air  Pain Assessment  Pain Scale 0-10  Pain Score 7  Pain Type Acute pain  Pain Location Elbow  Pain Orientation Medial;Right;Left  Pain Descriptors / Indicators Grimacing  Pain Frequency Intermittent  Pain Onset On-going  Patients Stated Pain Goal 7  Pain Intervention(s) Repositioned  Multiple Pain Sites Yes  MEWS Score  MEWS Temp 0  MEWS Systolic 0  MEWS Pulse 0  MEWS RR 0  MEWS LOC 0  MEWS Score 0

## 2020-10-21 NOTE — ED Notes (Signed)
Patient transported to X-ray 

## 2020-10-22 ENCOUNTER — Inpatient Hospital Stay (HOSPITAL_COMMUNITY): Payer: PPO

## 2020-10-22 DIAGNOSIS — I5022 Chronic systolic (congestive) heart failure: Secondary | ICD-10-CM | POA: Diagnosis not present

## 2020-10-22 DIAGNOSIS — E782 Mixed hyperlipidemia: Secondary | ICD-10-CM

## 2020-10-22 DIAGNOSIS — Z01818 Encounter for other preprocedural examination: Secondary | ICD-10-CM

## 2020-10-22 DIAGNOSIS — Z01810 Encounter for preprocedural cardiovascular examination: Secondary | ICD-10-CM

## 2020-10-22 DIAGNOSIS — W19XXXA Unspecified fall, initial encounter: Secondary | ICD-10-CM

## 2020-10-22 DIAGNOSIS — Y92009 Unspecified place in unspecified non-institutional (private) residence as the place of occurrence of the external cause: Secondary | ICD-10-CM

## 2020-10-22 DIAGNOSIS — I10 Essential (primary) hypertension: Secondary | ICD-10-CM

## 2020-10-22 DIAGNOSIS — D72829 Elevated white blood cell count, unspecified: Secondary | ICD-10-CM

## 2020-10-22 DIAGNOSIS — S52022A Displaced fracture of olecranon process without intraarticular extension of left ulna, initial encounter for closed fracture: Secondary | ICD-10-CM

## 2020-10-22 DIAGNOSIS — S42471A Displaced transcondylar fracture of right humerus, initial encounter for closed fracture: Principal | ICD-10-CM

## 2020-10-22 LAB — SURGICAL PCR SCREEN
MRSA, PCR: NEGATIVE
Staphylococcus aureus: NEGATIVE

## 2020-10-22 LAB — COMPREHENSIVE METABOLIC PANEL
ALT: 11 U/L (ref 0–44)
AST: 14 U/L — ABNORMAL LOW (ref 15–41)
Albumin: 3.1 g/dL — ABNORMAL LOW (ref 3.5–5.0)
Alkaline Phosphatase: 68 U/L (ref 38–126)
Anion gap: 10 (ref 5–15)
BUN: 13 mg/dL (ref 8–23)
CO2: 25 mmol/L (ref 22–32)
Calcium: 8.5 mg/dL — ABNORMAL LOW (ref 8.9–10.3)
Chloride: 104 mmol/L (ref 98–111)
Creatinine, Ser: 0.72 mg/dL (ref 0.44–1.00)
GFR, Estimated: >60 mL/min (ref >60)
Glucose, Bld: 110 mg/dL — ABNORMAL HIGH (ref 70–99)
Potassium: 3.7 mmol/L (ref 3.5–5.1)
Sodium: 139 mmol/L (ref 135–145)
Total Bilirubin: 0.8 mg/dL (ref 0.3–1.2)
Total Protein: 6 g/dL — ABNORMAL LOW (ref 6.5–8.1)

## 2020-10-22 LAB — CBC WITH DIFFERENTIAL/PLATELET
Abs Immature Granulocytes: 0.04 10*3/uL (ref 0.00–0.07)
Basophils Absolute: 0.1 10*3/uL (ref 0.0–0.1)
Basophils Relative: 1 %
Eosinophils Absolute: 0.3 10*3/uL (ref 0.0–0.5)
Eosinophils Relative: 3 %
HCT: 35.8 % — ABNORMAL LOW (ref 36.0–46.0)
Hemoglobin: 11.4 g/dL — ABNORMAL LOW (ref 12.0–15.0)
Immature Granulocytes: 0 %
Lymphocytes Relative: 24 %
Lymphs Abs: 2.8 10*3/uL (ref 0.7–4.0)
MCH: 28.1 pg (ref 26.0–34.0)
MCHC: 31.8 g/dL (ref 30.0–36.0)
MCV: 88.4 fL (ref 80.0–100.0)
Monocytes Absolute: 1 10*3/uL (ref 0.1–1.0)
Monocytes Relative: 9 %
Neutro Abs: 7.4 10*3/uL (ref 1.7–7.7)
Neutrophils Relative %: 63 %
Platelets: 198 10*3/uL (ref 150–400)
RBC: 4.05 MIL/uL (ref 3.87–5.11)
RDW: 13.7 % (ref 11.5–15.5)
WBC: 11.6 10*3/uL — ABNORMAL HIGH (ref 4.0–10.5)
nRBC: 0 % (ref 0.0–0.2)

## 2020-10-22 LAB — TSH: TSH: 2.56 u[IU]/mL (ref 0.350–4.500)

## 2020-10-22 LAB — URINALYSIS, ROUTINE W REFLEX MICROSCOPIC
Bilirubin Urine: NEGATIVE
Glucose, UA: NEGATIVE mg/dL
Hgb urine dipstick: NEGATIVE
Ketones, ur: NEGATIVE mg/dL
Leukocytes,Ua: NEGATIVE
Nitrite: NEGATIVE
Protein, ur: NEGATIVE mg/dL
Specific Gravity, Urine: 1.014 (ref 1.005–1.030)
pH: 7 (ref 5.0–8.0)

## 2020-10-22 LAB — ECHOCARDIOGRAM COMPLETE
Area-P 1/2: 3.21 cm2
Height: 63 in
S' Lateral: 3.2 cm
Weight: 2896 oz

## 2020-10-22 LAB — VITAMIN D 25 HYDROXY (VIT D DEFICIENCY, FRACTURES): Vit D, 25-Hydroxy: 22.56 ng/mL — ABNORMAL LOW (ref 30–100)

## 2020-10-22 LAB — CK: Total CK: 38 U/L (ref 38–234)

## 2020-10-22 LAB — APTT: aPTT: 30 seconds (ref 24–36)

## 2020-10-22 LAB — PROTIME-INR
INR: 1.2 (ref 0.8–1.2)
Prothrombin Time: 14.6 seconds (ref 11.4–15.2)

## 2020-10-22 LAB — MAGNESIUM: Magnesium: 1.9 mg/dL (ref 1.7–2.4)

## 2020-10-22 MED ORDER — HEPARIN SODIUM (PORCINE) 5000 UNIT/ML IJ SOLN
5000.0000 [IU] | Freq: Three times a day (TID) | INTRAMUSCULAR | Status: DC
  Administered 2020-10-22 (×2): 5000 [IU] via SUBCUTANEOUS
  Filled 2020-10-22 (×3): qty 1

## 2020-10-22 MED ORDER — CALCITRIOL 0.25 MCG PO CAPS
0.5000 ug | ORAL_CAPSULE | Freq: Every day | ORAL | Status: DC
  Administered 2020-10-22 – 2020-10-26 (×5): 0.5 ug via ORAL
  Filled 2020-10-22 (×5): qty 2

## 2020-10-22 MED ORDER — LACTATED RINGERS IV SOLN: INTRAVENOUS | Status: AC

## 2020-10-22 MED ORDER — HYDRALAZINE HCL 25 MG PO TABS
25.0000 mg | ORAL_TABLET | Freq: Three times a day (TID) | ORAL | Status: DC
  Administered 2020-10-22 – 2020-10-23 (×4): 25 mg via ORAL
  Filled 2020-10-22 (×5): qty 1

## 2020-10-22 NOTE — Progress Notes (Signed)
Dr. Carola Frost will assume care of her fractures and do her surgery Monday. I called her nurse who will relay to patient since patient cannot pick up phone with 2 broken elbows. She should be NPO tonight.

## 2020-10-22 NOTE — Consult Note (Signed)
Cardiology Consult    Patient ID: Kayla Carrillo; [id number]; August 11, 1936   Admit date: 10/21/2020 Date of Consult: 10/22/2020  Primary Care Provider: Lujean Amel, MD Primary Cardiologist: Kayla Breeding, MD   Patient Profile    Kayla Carrillo is a 84 y.o. female with past medical history of takotsubo cardiomyopathy (EF 40-45% by echo in 2009, normalized to 55% in 2018 by review of Care Everywhere and at 40-45% by repeat imaging in 05/2019), HLD and GERD who is being seen today for the evaluation of preoperative cardiac clearance at the request of Kayla Carrillo.   History of Present Illness    Kayla Carrillo was last examined by Kayla Carrillo in 05/2019 for follow-up from her recent echocardiogram which had shown a reduced EF of 40-45%, severe LVH and no significant valve abnormalities. Was under significant stress at that time by review of notes as she was going through a divorce. She was continued on Coreg 6.40m BID for her cardiomyopathy as BP did not allow for additional medication changes. She did have a PYP scan which was equivocal for amyloid but multiple myeloma panel was negative. She has not been evaluated by Cardiology since.   The patient presented to [facility]2020 after suffering a mechanical fall and was found to have an acute fracture of the left olecranon process of the proximal ulna and acute transcondylar fracture of the distal right humerus. CT Head showed no acute intracranial abnormalities. Findings were reviewed with [facility]ics [facility] Therefore, Cardiology has been consulted for surgical clearance.   She did have routine labs on admission which showed WBC 12.6, Hgb 13.1 and platelets 202. Na+ 140, K+ 4.2 and creatinine 0.83. COVID negative. EKG shows NSR, HR 83 with 1st degree AV Block and known LBBB.   In talking with the patient today, she reports being in her usual state of health  until yesterday. [place] [facility] an [employer] was at a magic show yesterday when she was walking across the room carrying a cup of water and her apartment keys when she slipped and fell forward. Denies any associated chest pain, palpitations, or dyspnea. No dizziness or presyncope. Was experiencing significant arm pain after her fall.   She reports being able to perform ADL's independently at baseline but does typically use a walker for ambulation but did not have this with her yesterday. She denies any recent chest pain or dyspnea on exertion when doing her normal activities. No recent orthopnea, PND or lower extremity edema.    Past Medical History:  Diagnosis Date  . [place]  . Depression with anxiety   . Dyslipidemia   . GERD (gastroesophageal reflux disease)     Past Surgical History:  Procedure Laterality Date  . CHOLECYSTECTOMY    . LEG SURGERY     Metal rod left leg  . TOTAL KNEE ARTHROPLASTY     Bilateral     Home Medications:  Prior to Admission medications   Medication Sig Start Date End Date Taking? Authorizing Provider  nitrofurantoin (MACRODANTIN) 100 MG capsule Take 100 mg by mouth at bedtime. 09/25/20  Yes [provider]  omeprazole (PRILOSEC) 20 MG capsule Take 20 mg by mouth daily.   Yes [provider]  oxybutynin (DITROPAN-XL) 10 MG 24 hr tablet Take 10 mg by mouth daily. 09/29/20  Yes [provider]  sertraline (ZOLOFT) 50 MG tablet Take 50 mg  by mouth daily.   Yes [provider]  simvastatin (ZOCOR) 40 MG tablet Take 40 mg by mouth daily.   Yes [provider]  Ascorbic Acid (VITAMIN C) 100 MG tablet Take 100 mg by mouth daily.    [provider]  aspirin EC 81 MG tablet Take 81 mg by mouth daily.    [provider]  carvedilol (COREG) 6.25 MG tablet Take 6.25 mg by mouth 2 (two) times daily with a meal.    [provider]    GLUCOSAMINE-CALCIUM-VIT D PO Take by mouth.    [provider]  meclizine (ANTIVERT) 25 MG tablet Take 25 mg by mouth 3 (three) times daily as needed for dizziness.    [provider]  meloxicam (MOBIC) 15 MG tablet Take 15 mg by mouth daily.    [provider]  Omega-3 Fatty Acids (FISH OIL) 1000 MG CAPS Take 1 capsule by mouth daily.     [provider]  promethazine-dextromethorphan (PROMETHAZINE-DM) 6.25-15 MG/5ML syrup Take 5 mLs by mouth 4 (four) times daily as needed for cough.    [provider]    Inpatient Medications: Scheduled Meds: . calcitRIOL  0.5 mcg Oral Daily  . carvedilol  6.25 mg Oral BID WC  . hydrALAZINE  25 mg Oral Q8H  . pantoprazole  40 mg Oral Daily  . sertraline  50 mg Oral Daily  . simvastatin  40 mg Oral Daily   Continuous Infusions: . lactated ringers 50 mL/hr at 10/22/20 0808   PRN Meds: acetaminophen **OR** acetaminophen, hydrALAZINE, meclizine, morphine injection **OR** morphine injection, [DISCONTINUED] ondansetron **OR** ondansetron (ZOFRAN) IV, polyethylene glycol  Allergies:    Allergies  Allergen Reactions  . Codeine Sulfate [Codeine] Other (See Comments)    drowsey  . Sulfa Antibiotics Other (See Comments)    drowsey    Social History:   Social History   Socioeconomic History  . Marital status: Single    Spouse name: Not on file  . Number of children: Not on file  . Years of education: Not on file  . Highest education level: Not on file  Occupational History  . Not on file  Tobacco Use  . Smoking status: Never Smoker  . Smokeless tobacco: Never Used  Substance and Sexual Activity  . Alcohol use: Not on file  . Drug use: Not on file  . Sexual activity: Not on file  Other Topics Concern  . Not on file  Social History Narrative   Four children. Husband lives with Kayla Carrillo with his daughters.     Social Determinants of Health   Financial Resource Strain:   . Difficulty of Paying Living  Expenses: Not on file  Food Insecurity:   . Worried About Charity fundraiser in the Last Year: Not on file  . Ran Out of Food in the Last Year: Not on file  Transportation Needs:   . Lack of Transportation (Medical): Not on file  . Lack of Transportation (Non-Medical): Not on file  Physical Activity:   . Days of Exercise per Week: Not on file  . Minutes of Exercise per Session: Not on file  Stress:   . Feeling of Stress : Not on file  Social Connections:   . Frequency of Communication with Friends and Family: Not on file  . Frequency of Social Gatherings with Friends and Family: Not on file  . Attends Religious Services: Not on file  . Active Member of Clubs or Organizations: Not on  file  . Attends Archivist Meetings: Not on file  . Marital Status: Not on file  Intimate Partner Violence:   . Fear of Current or Ex-Partner: Not on file  . Emotionally Abused: Not on file  . Physically Abused: Not on file  . Sexually Abused: Not on file     Family History:    Family History  Family history unknown: Yes      Review of Systems    General:  No chills, fever, night sweats or weight changes. Positive for mechanical fall.  Cardiovascular:  No chest pain, dyspnea on exertion, edema, orthopnea, palpitations, paroxysmal nocturnal dyspnea. Dermatological: No rash, lesions/masses Respiratory: No cough, dyspnea Urologic: No hematuria, dysuria Abdominal:   No nausea, vomiting, diarrhea, bright red blood per rectum, melena, or hematemesis Neurologic:  No visual changes, wkns, changes in mental status. All other systems reviewed and are otherwise negative except as noted above.  Physical Exam/Data    Vitals:   10/21/20 2004 10/21/20 2131 10/22/20 0444 10/22/20 0750  BP: (!) 186/89 (!) 177/76 (!) 156/82 (!) 146/65  Pulse: 84 79 84 80  Resp: 20 18 18 18   Temp: 98.5 F (36.9 C) 98 F (36.7 C) 98.4 F (36.9 C) 98.1 F (36.7 C)  TempSrc:  Oral Oral Oral  SpO2: 95% 95% 96%  93%  Weight:      Height:        Intake/Output Summary (Last 24 hours) at 10/22/2020 0959 Last data filed at 10/21/2020 2345 Gross per 24 hour  Intake 0 ml  Output --  Net 0 ml   Filed Weights   10/21/20 1628  Weight: 82.1 kg   Body mass index is 32.06 kg/m.   General: Pleasant elderly female appearing in NAD Psych: Normal affect. Neuro: Alert and oriented X 3. Moves all extremities spontaneously. HEENT: Normal  Neck: Supple without bruits or JVD. Lungs:  Resp regular and unlabored, CTA without wheezing or rales. Heart: RRR no s3, s4, or murmurs. Abdomen: Soft, non-tender, non-distended, BS + x 4.  Extremities: No clubbing, cyanosis or edema. Both arms wrapped.    EKG:  The EKG was personally reviewed and demonstrates: NSR, HR 83 with 1st degree AV Block and known LBBB.   Telemetry:  Telemetry was personally reviewed and demonstrates: NSR, HR in 70's to 80's.    Labs/Studies     Relevant CV Studies:  Echocardiogram: 05/2019  IMPRESSIONS    1. The left ventricle has mild-moderately reduced systolic function, with  an ejection fraction of 40-45%. The cavity size was normal. There is  severely increased left ventricular wall thickness. Indeterminate  diastolic filling due to E-A fusion. There  is abnormal septal motion consistent with left bundle branch block. Left  ventricular diffuse hypokinesis.  2. The right ventricle has normal systolic function. The cavity was  normal. There is no increase in right ventricular wall thickness.  3. The mitral valve is abnormal. Mild thickening of the mitral valve  leaflet.  4. The tricuspid valve is grossly normal.  5. The aortic valve is grossly normal. No stenosis of the aortic valve.    PYP Scan: 06/2019  Equivocal for TTR amyloidosis (Equivocal: A semi-quantitative visual score of 1 or H/CL ratio 1-1.5)  Follow-up Multiple Myeloma Panel in 07/2019 was negative    Laboratory Data:  Chemistry Recent Labs   Lab 10/21/20 1850 10/22/20 0209  NA 140 139  K 4.2 3.7  CL 106 104  CO2 24 25  GLUCOSE 109* 110*  BUN 18 13  CREATININE 0.83 0.72  CALCIUM 8.7* 8.5*  GFRNONAA >60 >60  ANIONGAP 10 10    Recent Labs  Lab 10/22/20 0209  PROT 6.0*  ALBUMIN 3.1*  AST 14*  ALT 11  ALKPHOS 68  BILITOT 0.8   Hematology Recent Labs  Lab 10/21/20 1850 10/22/20 0209  WBC 12.6* 11.6*  RBC 4.60 4.05  HGB 13.1 11.4*  HCT 40.5 35.8*  MCV 88.0 88.4  MCH 28.5 28.1  MCHC 32.3 31.8  RDW 13.8 13.7  PLT 202 198   Cardiac EnzymesNo results for input(s): TROPONINI in the last 168 hours. No results for input(s): TROPIPOC in the last 168 hours.  BNPNo results for input(s): BNP, PROBNP in the last 168 hours.  DDimer No results for input(s): DDIMER in the last 168 hours.  Radiology/Studies:  DG Elbow Complete Left  Result Date: 10/21/2020 CLINICAL DATA:  Left elbow pain after fall EXAM: LEFT ELBOW - COMPLETE 3+ VIEW COMPARISON:  None. FINDINGS: Acute fracture of the olecranon process of the proximal ulna with 4 mm of fracture distraction. Fracture extends intra-articularly to the ulnotrochlear joint. There is a small elbow joint hemarthrosis. No additional fractures are identified. There is prominence of the radial tuberosity, nonspecific. Degenerative changes of the elbow joint. Enthesopathic changes at the medial and lateral humeral epicondyles. Prominent soft tissue swelling/hematoma overlying the fracture site at the posterior elbow. IMPRESSION: Left elbow: 1. Acute fracture of the olecranon process of the proximal ulna with 4 mm of fracture distraction. 2. Prominent soft tissue swelling/hematoma overlying the fracture site at the fracture site. Electronically Signed   By: Davina Poke D.O.   On: 10/21/2020 17:31   DG Elbow Complete Right  Result Date: 10/21/2020 CLINICAL DATA:  Fall, right elbow pain EXAM: RIGHT ELBOW - COMPLETE 3+ VIEW COMPARISON:  None. FINDINGS: Acute transcondylar fracture of  the distal right humerus with intra-articular extension. Slight anterior displacement and angulation at the fracture site. No fractures identified involving the proximal radius or ulna. There is a large elbow joint hemarthrosis. Diffuse soft tissue swelling. IMPRESSION: Right elbow: Acute transcondylar fracture of the distal right humerus with intra-articular extension and slight anterior displacement and angulation. Electronically Signed   By: Davina Poke D.O.   On: 10/21/2020 17:35   CT Head Wo Contrast  Result Date: 10/21/2020 CLINICAL DATA:  84 year old female with fall and head injury today. EXAM: CT HEAD WITHOUT CONTRAST TECHNIQUE: Contiguous axial images were obtained from the base of the skull through the vertex without intravenous contrast. COMPARISON:  None. FINDINGS: Brain: No evidence of acute infarction, hemorrhage, hydrocephalus, extra-axial collection or mass lesion/mass effect. Moderate periventricular white matter hypodensities are of uncertain chronicity but most likely represent chronic small-vessel white matter ischemic changes. Mild generalized cerebral atrophy is noted. Vascular: Carotid atherosclerotic calcifications are noted. Skull: Normal. Negative for fracture or focal lesion. Sinuses/Orbits: No acute finding. Other: None. IMPRESSION: 1. No evidence of acute intracranial abnormality. 2. Moderate periventricular white matter hypodensities - likely chronic small-vessel white matter ischemic changes. 3. Mild generalized cerebral atrophy. Electronically Signed   By: Margarette Canada M.D.   On: 10/21/2020 17:31     Assessment & Plan    1. Preoperative Cardiac Clearance for Surgical Repair of Fractured Left Ulna and Right Humerus - Presented with a mechanical fall and found to have bilateral fractures as outlined above.  - She denies any recent anginal symptoms. She does have a history of takotsubo cardiomyopathy but no known history of CAD. EKG  shows NSR, HR 83 with 1st degree AV  Block and known LBBB.  - RCRI Risk is overall low at 0.9% risk of a major cardiac event. Would not anticipate further ischemic testing at this time prior to her planned surgery. Will obtain an updated echocardiogram to help guide further medication adjustments going forward if her EF remains reduced.  2. History of Takotsubo Cardiomyopathy - EF was previously 40-45% by echo in 2009, normalized to 55% in 2018 by review of Care Everywhere. Most recent echo in 05/2019 showed her EF was again reduced at 40-45%.  - She denies any recent orthopnea, PND or edema. She has been receiving IVF since admission at 100 mL/hr and reduced to 50 mL/hr this AM and tolerating well at that time.  - Would plan for a repeat echocardiogram for reassessment as this would help guide further medication changes.   3. HLD - She has been continued on PTA Simvastatin 14m daily.   4. HTN - BP has been elevated at 146/65 - 189/89 within the past 24 hours. She has been continued on PTA Coreg 6.292mBID and Hydralazine 2536mID was added by the Hospitalist. Further medication recommendations pending repeat echo.    For questions or updates, please contact [facility] consult [email or web address] for contact info under Cardiology/STEMI.  Signed, BriErma HeritageA-C 10/22/2020, 9:59 AM Pager: [phone]

## 2020-10-22 NOTE — Plan of Care (Signed)
  Problem: Pain Managment: Goal: General experience of comfort will improve Outcome: Progressing   Problem: Safety: Goal: Ability to remain free from injury will improve Outcome: Progressing   

## 2020-10-22 NOTE — Progress Notes (Signed)
Pt seen. Plan surgery tomorrow. May have Orthopedic traumatologist see if they have earlier time than late Monday and they could treat. Surgery will be Monday. Discussed with pt and will let her know and place note later today.

## 2020-10-22 NOTE — Progress Notes (Signed)
Echocardiogram 2D Echocardiogram has been performed.  Kayla Carrillo 10/22/2020, 12:07 PM

## 2020-10-22 NOTE — Progress Notes (Signed)
OT Cancellation Note  Patient Details Name: Kayla Carrillo MRN: [id number] DOB: [birth year]   Cancelled Treatment:    Reason Eval/Treat Not Completed: Other (comment);Medical issues which prohibited therapy; noted plans for OR tomorrow to address bil elbow fractures. Will await surgery and follow up post-op for OT eval.  Marcy Siren, OT Acute Rehabilitation Services Pager [phone] Office [phone]   Orlando Penner 10/22/2020, 3:52 PM

## 2020-10-22 NOTE — Progress Notes (Signed)
PROGRESS NOTE                                                                                                                                                                                                             Patient Demographics:    Kayla Carrillo, is a 84 y.o. female, DOB - [birth year], WEX:[id number]  Outpatient Primary MD for the patient is Koirala, Dibas, MD    LOS - 1  Admit date - 10/21/2020    Chief Complaint  Patient presents with   Fall       Brief Narrative (HPI from H&P) -  84 year old female with past medical history of hypertension, Takotsubo cardiomyopathy, gastroesophageal reflux disease, hyperlipidemia who presents to [facility] emergency department via EMS after patient fell injuring her arms, in the ER her work-up showed olecranon process fracture of the left elbow as well as a transcondylar fracture of the distal right humerus, orthopedics was consulted and hospitalist were requested to admit the patient.    Subjective:    Kayla Carrillo today has, No headache, No chest pain, No abdominal pain - No Nausea, No new weakness tingling or numbness, no cough or shortness of breath.   Assessment  & Plan :     1. Ankle fall with bilateral elbow fractures - olecranon process fracture of the left elbow as well as a transcondylar fracture of the distal right humerus - currently both arms in splint, orthopedics preparing for surgical correction in the next day or so.  She will be moderate risk for adverse cardiopulmonary outcome.  Patient accepts the risk and wants to proceed with surgical procedure if needed.  Cardiology also evaluated the patient for the same reason.  Encouraged the patient to sit up in chair in the daytime use I-S and flutter valve for pulmonary toiletry.   2.  Recent history of Takotsubo cardiomyopathy with left bundle branch block.  Currently appears compensated,  good exercise tolerance at baseline able to climb a flight of stairs without any chest discomfort, continue Coreg, statin along with hydralazine.  Echocardiogram pending from this admission.  She follows with Dr. Rollene Rotunda.  3.  Essential hypertension.  On Coreg, hydralazine added for better control.   4.  Stress related leukocytosis.  Monitor.  5.  Dyslipidemia.  On statin.    Condition - Fair  Family Communication  : Called daughter [phone] on 1024 12:18 PM.  Micah Flesher to answering machine.  Code Status :  Full  Consults  :  Ortho  Procedures  :    TTE  PUD Prophylaxis : PPI  Disposition Plan  :    Status is: Inpatient  Remains inpatient appropriate because:Inpatient level of care appropriate due to severity of illness   Dispo: The patient is from: Home              Anticipated d/c is to: SNF              Anticipated d/c date is: > 3 days              Patient currently is not medically stable to d/c.   DVT Prophylaxis  :  Heparin   Lab Results  Component Value Date   PLT 198 10/22/2020    Diet :  Diet Order            DIET SOFT Room service appropriate? Yes; Fluid consistency: Thin  Diet effective now                  Inpatient Medications  Scheduled Meds:  calcitRIOL  0.5 mcg Oral Daily   carvedilol  6.25 mg Oral BID WC   hydrALAZINE  25 mg Oral Q8H   pantoprazole  40 mg Oral Daily   sertraline  50 mg Oral Daily   simvastatin  40 mg Oral Daily   Continuous Infusions:  lactated ringers 50 mL/hr at 10/22/20 0808   PRN Meds:.acetaminophen **OR** acetaminophen, hydrALAZINE, meclizine, morphine injection **OR** morphine injection, [DISCONTINUED] ondansetron **OR** ondansetron (ZOFRAN) IV, polyethylene glycol  Antibiotics  :    Anti-infectives (From admission, onward)   None       Time Spent in minutes  30   Susa Raring M.D on 10/22/2020 at 12:17 PM  To page go to [email or web address] - password [email or web address]  [facility] -  Office   [phone]    See all Orders from today for further details    Objective:   Vitals:   10/21/20 2004 10/21/20 2131 10/22/20 0444 10/22/20 0750  BP: (!) 186/89 (!) 177/76 (!) 156/82 (!) 146/65  Pulse: 84 79 84 80  Resp: 20 18 18 18   Temp: 98.5 F (36.9 C) 98 F (36.7 C) 98.4 F (36.9 C) 98.1 F (36.7 C)  TempSrc:  Oral Oral Oral  SpO2: 95% 95% 96% 93%  Weight:      Height:        Wt Readings from Last 3 Encounters:  10/21/20 82.1 kg  07/15/19 83.9 kg  06/29/19 83.6 kg     Intake/Output Summary (Last 24 hours) at 10/22/2020 1217 Last data filed at 10/22/2020 0930 Gross per 24 hour  Intake 0 ml  Output 500 ml  Net -500 ml     Physical Exam  Awake Alert, No new F.N deficits, Normal affect [place].AT,PERRAL Supple Neck,No JVD, No cervical lymphadenopathy appriciated.  Symmetrical Chest wall movement, Good air movement bilaterally, CTAB RRR,No Gallops,Rubs or new Murmurs, No Parasternal Heave +ve B.Sounds, Abd Soft, No tenderness, No organomegaly appriciated, No rebound - guarding or rigidity. No Cyanosis, Clubbing or edema, both elbows under splint,    Data Review:    CBC Recent Labs  Lab 10/21/20 1850 10/22/20 0209  WBC 12.6* 11.6*  HGB 13.1 11.4*  HCT 40.5 35.8*  PLT 202 198  MCV 88.0 88.4  MCH 28.5 28.1  MCHC 32.3 31.8  RDW 13.8 13.7  LYMPHSABS  --  2.8  MONOABS  --  1.0  EOSABS  --  0.3  BASOSABS  --  0.1    Recent Labs  Lab 10/21/20 1850 10/22/20 0209  NA 140 139  K 4.2 3.7  CL 106 104  CO2 24 25  GLUCOSE 109* 110*  BUN 18 13  CREATININE 0.83 0.72  CALCIUM 8.7* 8.5*  AST  --  14*  ALT  --  11  ALKPHOS  --  68  BILITOT  --  0.8  ALBUMIN  --  3.1*  MG  --  1.9  INR  --  1.2  TSH  --  2.560    ------------------------------------------------------------------------------------------------------------------ No results for input(s): CHOL, HDL, LDLCALC, TRIG, CHOLHDL, LDLDIRECT in the last 72 hours.  No results found for:  HGBA1C ------------------------------------------------------------------------------------------------------------------ Recent Labs    10/22/20 0209  TSH 2.560    Cardiac Enzymes No results for input(s): CKMB, TROPONINI, MYOGLOBIN in the last 168 hours.  Invalid input(s): CK ------------------------------------------------------------------------------------------------------------------ No results found for: BNP  Micro Results Recent Results (from the past 240 hour(s))  Respiratory Panel by RT PCR (Flu A&B, Covid) - Nasopharyngeal Swab     Status: None   Collection Time: 10/21/20  6:50 PM   Specimen: Nasopharyngeal Swab  Result Value Ref Range Status   SARS Coronavirus 2 by RT PCR NEGATIVE NEGATIVE Final    Comment: (NOTE) SARS-CoV-2 target nucleic acids are NOT DETECTED.  The SARS-CoV-2 RNA is generally detectable in upper respiratoy specimens during the acute phase of infection. The lowest concentration of SARS-CoV-2 viral copies this assay can detect is 131 copies/mL. A negative result does not preclude SARS-Cov-2 infection and should not be used as the sole basis for treatment or other patient management decisions. A negative result may occur with  improper specimen collection/handling, submission of specimen other than nasopharyngeal swab, presence of viral mutation(s) within the areas targeted by this assay, and inadequate number of viral copies (<131 copies/mL). A negative result must be combined with clinical observations, patient history, and epidemiological information. The expected result is Negative.  Fact Sheet for Patients:  https://www.moore.com/  Fact Sheet for Healthcare Providers:  https://www.young.biz/  This test is no t yet approved or cleared by the [place] FDA and  has been authorized for detection and/or diagnosis of SARS-CoV-2 by FDA under an Emergency Use Authorization (EUA). This EUA will remain   in effect (meaning this test can be used) for the duration of the COVID-19 declaration under Section 564(b)(1) of the Act, 21 U.S.C. section 360bbb-3(b)(1), unless the authorization is terminated or revoked sooner.     Influenza A by PCR NEGATIVE NEGATIVE Final   Influenza B by PCR NEGATIVE NEGATIVE Final    Comment: (NOTE) The Xpert Xpress SARS-CoV-2/FLU/RSV assay is intended as an aid in  the diagnosis of influenza from Nasopharyngeal swab specimens and  should not be used as a sole basis for treatment. Nasal washings and  aspirates are unacceptable for Xpert Xpress SARS-CoV-2/FLU/RSV  testing.  Fact Sheet for Patients: https://www.moore.com/  Fact Sheet for Healthcare Providers: https://www.young.biz/  This test is not yet approved or cleared by the [place] FDA and  has been authorized for detection and/or diagnosis of SARS-CoV-2 by  FDA under an Emergency Use Authorization (EUA). This EUA will remain  in effect (meaning this test can be used) for the duration of the  Covid-19 declaration under Section 564(b)(1) of the Act, 21  U.S.C. section 360bbb-3(b)(1), unless  the authorization is  terminated or revoked. Performed at [facility] [place], [address] [place], [place] [address]   Surgical pcr screen     Status: None   Collection Time: 10/22/20 12:04 AM   Specimen: Nasal Mucosa; Nasal Swab  Result Value Ref Range Status   MRSA, PCR NEGATIVE NEGATIVE Final   Staphylococcus aureus NEGATIVE NEGATIVE Final    Comment: (NOTE) The Xpert SA Assay (FDA approved for NASAL specimens in patients 82 years of age and older), is one component of a comprehensive surveillance program. It is not intended to diagnose infection nor to guide or monitor treatment. Performed at [facility], [address] [place], [place] [address]     Radiology Reports DG Elbow Complete Left  Result Date: 10/21/2020 CLINICAL DATA:  Left  elbow pain after fall EXAM: LEFT ELBOW - COMPLETE 3+ VIEW COMPARISON:  None. FINDINGS: Acute fracture of the olecranon process of the proximal ulna with 4 mm of fracture distraction. Fracture extends intra-articularly to the ulnotrochlear joint. There is a small elbow joint hemarthrosis. No additional fractures are identified. There is prominence of the radial tuberosity, nonspecific. Degenerative changes of the elbow joint. Enthesopathic changes at the medial and lateral humeral epicondyles. Prominent soft tissue swelling/hematoma overlying the fracture site at the posterior elbow. IMPRESSION: Left elbow: 1. Acute fracture of the olecranon process of the proximal ulna with 4 mm of fracture distraction. 2. Prominent soft tissue swelling/hematoma overlying the fracture site at the fracture site. Electronically Signed   By: Duanne GuessNicholas  Plundo D.O.   On: 10/21/2020 17:31   DG Elbow Complete Right  Result Date: 10/21/2020 CLINICAL DATA:  Fall, right elbow pain EXAM: RIGHT ELBOW - COMPLETE 3+ VIEW COMPARISON:  None. FINDINGS: Acute transcondylar fracture of the distal right humerus with intra-articular extension. Slight anterior displacement and angulation at the fracture site. No fractures identified involving the proximal radius or ulna. There is a large elbow joint hemarthrosis. Diffuse soft tissue swelling. IMPRESSION: Right elbow: Acute transcondylar fracture of the distal right humerus with intra-articular extension and slight anterior displacement and angulation. Electronically Signed   By: Duanne GuessNicholas  Plundo D.O.   On: 10/21/2020 17:35   CT Head Wo Contrast  Result Date: 10/21/2020 CLINICAL DATA:  84 year old female with fall and head injury today. EXAM: CT HEAD WITHOUT CONTRAST TECHNIQUE: Contiguous axial images were obtained from the base of the skull through the vertex without intravenous contrast. COMPARISON:  None. FINDINGS: Brain: No evidence of acute infarction, hemorrhage, hydrocephalus, extra-axial  collection or mass lesion/mass effect. Moderate periventricular white matter hypodensities are of uncertain chronicity but most likely represent chronic small-vessel white matter ischemic changes. Mild generalized cerebral atrophy is noted. Vascular: Carotid atherosclerotic calcifications are noted. Skull: Normal. Negative for fracture or focal lesion. Sinuses/Orbits: No acute finding. Other: None. IMPRESSION: 1. No evidence of acute intracranial abnormality. 2. Moderate periventricular white matter hypodensities - likely chronic small-vessel white matter ischemic changes. 3. Mild generalized cerebral atrophy. Electronically Signed   By: Harmon PierJeffrey  Hu M.D.   On: 10/21/2020 17:31

## 2020-10-22 NOTE — H&P (Signed)
History and Physical    Kayla Carrillo WUX:[id number] DOB: [birth year] DOA: 10/21/2020  PCP: [name], MD  Patient coming from: [facility] via EMS   Chief Complaint:  Chief Complaint  Patient presents with   Fall     HPI: 84 year old female with past medical history of hypertension, Takotsubo cardiomyopathy, gastroesophageal reflux disease, hyperlipidemia who presents to [facility] emergency department via EMS after patient fell injuring her arms.  Patient explains that there was a social event at her independent living facility on 10/24.  The patient had walked to the kitchen to get a cup of water when she suddenly stumbled falling to the ground.  Patient denies any preceding focal weakness, lightheadedness palpitations or loss of consciousness prompting her fall.  Immediately after falling patient began to experience severe 10 out of 10 pain of the bilateral elbows, sharp in quality, radiating proximally and worse with any movement of the extremities.  EMS was contacted and the patient was promptly brought into med [facility] emergency department for evaluation.  Upon thorough evaluation of the med [facility] emergency department patient was found to have an olecranon process fracture of the left elbow as well as a transcondylar fracture of the distal right humerus.  Case was discussed with Dr. Ophelia Charter with orthopedic surgery who recommended transfer to [facility] for medicine to admit and orthopedic surgery to consult for operative intervention.  Hospitalist group is now been called to assess the patient for admission to the hospital upon arrival to the medical unit at [facility].  Review of Systems:   ROS  Past Medical History:  Diagnosis Date   Cardiomyopathy (HCC)    Takotsubo   Depression with anxiety    Dyslipidemia    GERD (gastroesophageal reflux disease)     Past Surgical History:  Procedure  Laterality Date   CHOLECYSTECTOMY     LEG SURGERY     Metal rod left leg   TOTAL KNEE ARTHROPLASTY     Bilateral     reports that she has never smoked. She has never used smokeless tobacco. No history on file for alcohol use and drug use.  Allergies  Allergen Reactions   Codeine Sulfate [Codeine] Other (See Comments)    drowsey   Sulfa Antibiotics Other (See Comments)    drowsey    Family History  Family history unknown: Yes     Prior to Admission medications   Medication Sig Start Date End Date Taking? Authorizing Provider  Ascorbic Acid (VITAMIN C) 100 MG tablet Take 100 mg by mouth daily.    [provider]  aspirin EC 81 MG tablet Take 81 mg by mouth daily.    [provider]  carvedilol (COREG) 6.25 MG tablet Take 6.25 mg by mouth 2 (two) times daily with a meal.    [provider]  GLUCOSAMINE-CALCIUM-VIT D PO Take by mouth.    [provider]  meclizine (ANTIVERT) 25 MG tablet Take 25 mg by mouth 3 (three) times daily as needed for dizziness.    [provider]  meloxicam (MOBIC) 15 MG tablet Take 15 mg by mouth daily.    [provider]  Omega-3 Fatty Acids (FISH OIL) 1000 MG CAPS Take 1 capsule by mouth daily.     [provider]  omeprazole (PRILOSEC) 20 MG capsule Take 20 mg by mouth daily.    [provider]  promethazine-dextromethorphan (PROMETHAZINE-DM) 6.25-15 MG/5ML syrup Take 5 mLs by  mouth 4 (four) times daily as needed for cough.    [provider]  sertraline (ZOLOFT) 50 MG tablet Take 50 mg by mouth daily.    [provider]  simvastatin (ZOCOR) 40 MG tablet Take 40 mg by mouth daily.    [provider]    Physical Exam: Vitals:   10/21/20 1628 10/21/20 1630 10/21/20 2004 10/21/20 2131  BP:  (!) 179/77 (!) 186/89 (!) 177/76  Pulse:  80 84 79  Resp:  18 20 18   Temp:  98.5 F (36.9 C) 98.5 F (36.9 C) 98 F (36.7 C)  TempSrc:  Oral  Oral  SpO2:   95% 95% 95%  Weight: 82.1 kg     Height: 5\' 3"  (1.6 m)       Constitutional: Acute alert and oriented x3, patient is in distress due to bilateral upper extremity pain. Skin: no rashes, no lesions, good skin turgor noted. Eyes: Pupils are equally reactive to light.  No evidence of scleral icterus or conjunctival pallor.  ENMT: Moist mucous membranes noted.  Posterior pharynx clear of any exudate or lesions.   Neck: normal, supple, no masses, no thyromegaly.  No evidence of jugular venous distension.   Respiratory: clear to auscultation bilaterally, no wheezing, no crackles. Normal respiratory effort. No accessory muscle use.  Cardiovascular: Regular rate and rhythm, no murmurs / rubs / gallops. No extremity edema. 2+ pedal pulses. No carotid bruits.  Chest:   Nontender without crepitus or deformity.   Back:   Nontender without crepitus or deformity. Abdomen: Abdomen is soft and nontender.  No evidence of intra-abdominal masses.  Positive bowel sounds noted in all quadrants.   Musculoskeletal: Splints of the bilateral upper extremities are in place.  Pain with both passive and active movement of the bilateral upper extremities.  No other notable deformities noted.  Neurologic: CN 2-12 grossly intact. Sensation intact.  Patient moving all 4 extremities spontaneously.  Patient is following all commands.  Patient is responsive to verbal stimuli.   Psychiatric: Patient exhibits depressed mood with appropriate affect.  Patient seems to possess insight as to their current situation.     Labs on Admission: I have personally reviewed following labs and imaging studies -   CBC: Recent Labs  Lab 10/21/20 1850  WBC 12.6*  HGB 13.1  HCT 40.5  MCV 88.0  PLT 202   Basic Metabolic Panel: Recent Labs  Lab 10/21/20 1850  NA 140  K 4.2  CL 106  CO2 24  GLUCOSE 109*  BUN 18  CREATININE 0.83  CALCIUM 8.7*   GFR: Estimated Creatinine Clearance: 53 mL/min (by C-G formula based on SCr of 0.83  mg/dL). Liver Function Tests: No results for input(s): AST, ALT, ALKPHOS, BILITOT, PROT, ALBUMIN in the last 168 hours. No results for input(s): LIPASE, AMYLASE in the last 168 hours. No results for input(s): AMMONIA in the last 168 hours. Coagulation Profile: No results for input(s): INR, PROTIME in the last 168 hours. Cardiac Enzymes: No results for input(s): CKTOTAL, CKMB, CKMBINDEX, TROPONINI in the last 168 hours. BNP (last 3 results) No results for input(s): PROBNP in the last 8760 hours. HbA1C: No results for input(s): HGBA1C in the last 72 hours. CBG: No results for input(s): GLUCAP in the last 168 hours. Lipid Profile: No results for input(s): CHOL, HDL, LDLCALC, TRIG, CHOLHDL, LDLDIRECT in the last 72 hours. Thyroid Function Tests: No results for input(s): TSH, T4TOTAL, FREET4, T3FREE, THYROIDAB in the last 72 hours. Anemia Panel: No results  for input(s): VITAMINB12, FOLATE, FERRITIN, TIBC, IRON, RETICCTPCT in the last 72 hours. Urine analysis:    Component Value Date/Time   COLORURINE YELLOW 10/21/2020 2348   APPEARANCEUR CLEAR 10/21/2020 2348   LABSPEC 1.014 10/21/2020 2348   PHURINE 7.0 10/21/2020 2348   GLUCOSEU NEGATIVE 10/21/2020 2348   HGBUR NEGATIVE 10/21/2020 2348   BILIRUBINUR NEGATIVE 10/21/2020 2348   KETONESUR NEGATIVE 10/21/2020 2348   PROTEINUR NEGATIVE 10/21/2020 2348   NITRITE NEGATIVE 10/21/2020 2348   LEUKOCYTESUR NEGATIVE 10/21/2020 2348    Radiological Exams on Admission - Personally Reviewed: DG Elbow Complete Left  Result Date: 10/21/2020 CLINICAL DATA:  Left elbow pain after fall EXAM: LEFT ELBOW - COMPLETE 3+ VIEW COMPARISON:  None. FINDINGS: Acute fracture of the olecranon process of the proximal ulna with 4 mm of fracture distraction. Fracture extends intra-articularly to the ulnotrochlear joint. There is a small elbow joint hemarthrosis. No additional fractures are identified. There is prominence of the radial tuberosity, nonspecific.  Degenerative changes of the elbow joint. Enthesopathic changes at the medial and lateral humeral epicondyles. Prominent soft tissue swelling/hematoma overlying the fracture site at the posterior elbow. IMPRESSION: Left elbow: 1. Acute fracture of the olecranon process of the proximal ulna with 4 mm of fracture distraction. 2. Prominent soft tissue swelling/hematoma overlying the fracture site at the fracture site. Electronically Signed   By: Duanne GuessNicholas  Plundo D.O.   On: 10/21/2020 17:31   DG Elbow Complete Right  Result Date: 10/21/2020 CLINICAL DATA:  Fall, right elbow pain EXAM: RIGHT ELBOW - COMPLETE 3+ VIEW COMPARISON:  None. FINDINGS: Acute transcondylar fracture of the distal right humerus with intra-articular extension. Slight anterior displacement and angulation at the fracture site. No fractures identified involving the proximal radius or ulna. There is a large elbow joint hemarthrosis. Diffuse soft tissue swelling. IMPRESSION: Right elbow: Acute transcondylar fracture of the distal right humerus with intra-articular extension and slight anterior displacement and angulation. Electronically Signed   By: Duanne GuessNicholas  Plundo D.O.   On: 10/21/2020 17:35   CT Head Wo Contrast  Result Date: 10/21/2020 CLINICAL DATA:  84 year old female with fall and head injury today. EXAM: CT HEAD WITHOUT CONTRAST TECHNIQUE: Contiguous axial images were obtained from the base of the skull through the vertex without intravenous contrast. COMPARISON:  None. FINDINGS: Brain: No evidence of acute infarction, hemorrhage, hydrocephalus, extra-axial collection or mass lesion/mass effect. Moderate periventricular white matter hypodensities are of uncertain chronicity but most likely represent chronic small-vessel white matter ischemic changes. Mild generalized cerebral atrophy is noted. Vascular: Carotid atherosclerotic calcifications are noted. Skull: Normal. Negative for fracture or focal lesion. Sinuses/Orbits: No acute  finding. Other: None. IMPRESSION: 1. No evidence of acute intracranial abnormality. 2. Moderate periventricular white matter hypodensities - likely chronic small-vessel white matter ischemic changes. 3. Mild generalized cerebral atrophy. Electronically Signed   By: Harmon PierJeffrey  Hu M.D.   On: 10/21/2020 17:31     Assessment/Plan Principal Problem:   Fall at home, initial encounter   Patient presenting status post what seems to be a mechanical fall suffering severe injuries to the bilateral elbows.  Right elbow x-ray revealing acute transcondylar fracture of the distal right humerus with intra-articular extension with slight anterior displacement  Left elbow x-ray revealing acute fracture of the open process of the proximal ulna with 4 mm of fracture distraction and swelling overlying the fracture site.  These injuries have been discussed with Dr. Ophelia CharterYates by Dr. Bernette MayersSheldon with the emergency department who recommended [facility]  for Ortho to follow for operative intervention.  Dr. Ophelia Charter was concerned that this may be a complicated process requiring multiple surgeons.  orthopedic surgery to evaluate likely in the morning  -we will keep n.p.o. after midnight in case of operative intervention.    Performing serial neurovascular checks every 4 hours  Obtaining creatine kinase, vitamin D level  No previous analgesics for substantial associated pain  Patient will need occupational therapy evaluation - the road to recovery will prove difficult as both of the patient's arms are incapacitated.  Active Problems:   Closed olecranon fracture, left, initial encounter   Please see assessment and plan above    Closed displaced transcondylar fracture of right humerus   Please see assessment and plan above    Essential hypertension   Continue home regimen of oral antihypertensive therapy  As needed intravenous) for excessively elevated blood pressures     Leukocytosis   Notable leukocytosis, likely secondary to stress of injury and not due to underlying infectious process    Mixed hyperlipidemia    Continue home regimen of statin therapy   Code Status:  Full code Family Communication: Deferred  Status is: Inpatient  Remains inpatient appropriate because:IV treatments appropriate due to intensity of illness or inability to take PO and Inpatient level of care appropriate due to severity of illness   Dispo: The patient is from: Independent living facility              Anticipated d/c is to: SNF              Anticipated d/c date is: > 3 days              Patient currently is not medically stable to d/c.        Marinda Elk MD [facility] Pager [phone]  If 7PM-7AM, please contact night-coverage [email or web address] Use universal [place] password for that web site. If you do not have the password, please call the hospital operator.  10/22/2020, 1:33 AM

## 2020-10-23 ENCOUNTER — Inpatient Hospital Stay (HOSPITAL_COMMUNITY): Payer: PPO

## 2020-10-23 ENCOUNTER — Encounter (HOSPITAL_COMMUNITY): Payer: Self-pay | Admitting: Internal Medicine

## 2020-10-23 ENCOUNTER — Encounter (HOSPITAL_COMMUNITY)
Admission: EM | Disposition: A | Discharge status: Skilled Nursing Facility | Payer: Self-pay | Source: Home / Self Care | Attending: Internal Medicine

## 2020-10-23 DIAGNOSIS — I10 Essential (primary) hypertension: Secondary | ICD-10-CM | POA: Diagnosis not present

## 2020-10-23 HISTORY — PX: ORIF HUMERUS FRACTURE: SHX2126

## 2020-10-23 HISTORY — PX: ORIF ELBOW FRACTURE: SHX5031

## 2020-10-23 LAB — TYPE AND SCREEN
ABO/RH(D): O POS
Antibody Screen: NEGATIVE

## 2020-10-23 LAB — ABO/RH: ABO/RH(D): O POS

## 2020-10-23 SURGERY — OPEN REDUCTION INTERNAL FIXATION (ORIF) ELBOW/OLECRANON FRACTURE
Anesthesia: Regional | Site: Elbow | Laterality: Right

## 2020-10-23 MED ORDER — ACETAMINOPHEN 500 MG PO TABS
500.0000 mg | ORAL_TABLET | Freq: Three times a day (TID) | ORAL | Status: DC
  Administered 2020-10-24 – 2020-10-26 (×6): 500 mg via ORAL
  Filled 2020-10-23 (×7): qty 1

## 2020-10-23 MED ORDER — LORAZEPAM 2 MG/ML IJ SOLN
INTRAMUSCULAR | Status: AC
  Filled 2020-10-23: qty 1

## 2020-10-23 MED ORDER — ROCURONIUM BROMIDE 10 MG/ML (PF) SYRINGE
PREFILLED_SYRINGE | INTRAVENOUS | Status: DC | PRN
  Administered 2020-10-23: 10 mg via INTRAVENOUS
  Administered 2020-10-23: 80 mg via INTRAVENOUS
  Administered 2020-10-23: 10 mg via INTRAVENOUS

## 2020-10-23 MED ORDER — HYDRALAZINE HCL 20 MG/ML IJ SOLN
INTRAMUSCULAR | Status: DC | PRN
  Administered 2020-10-23: 2.5 mg via INTRAVENOUS

## 2020-10-23 MED ORDER — CHLORHEXIDINE GLUCONATE 0.12 % MT SOLN
OROMUCOSAL | Status: AC
  Administered 2020-10-23: 15 mL via OROMUCOSAL
  Filled 2020-10-23: qty 15

## 2020-10-23 MED ORDER — ROCURONIUM BROMIDE 10 MG/ML (PF) SYRINGE
PREFILLED_SYRINGE | INTRAVENOUS | Status: AC
  Filled 2020-10-23: qty 10

## 2020-10-23 MED ORDER — MIDAZOLAM HCL 5 MG/5ML IJ SOLN
INTRAMUSCULAR | Status: DC | PRN
  Administered 2020-10-23: 2 mg via INTRAVENOUS

## 2020-10-23 MED ORDER — HYDROMORPHONE HCL 1 MG/ML IJ SOLN
0.2500 mg | INTRAMUSCULAR | Status: DC | PRN
  Administered 2020-10-23 (×4): 0.25 mg via INTRAVENOUS

## 2020-10-23 MED ORDER — METOCLOPRAMIDE HCL 5 MG/ML IJ SOLN: 5.0000 mg | Freq: Three times a day (TID) | INTRAMUSCULAR | Status: DC | PRN

## 2020-10-23 MED ORDER — ROCURONIUM BROMIDE 10 MG/ML (PF) SYRINGE
PREFILLED_SYRINGE | INTRAVENOUS | Status: AC
  Filled 2020-10-23: qty 20

## 2020-10-23 MED ORDER — METOCLOPRAMIDE HCL 10 MG PO TABS: 5.0000 mg | ORAL_TABLET | Freq: Three times a day (TID) | ORAL | Status: DC | PRN

## 2020-10-23 MED ORDER — ENOXAPARIN SODIUM 40 MG/0.4ML ~~LOC~~ SOLN
40.0000 mg | SUBCUTANEOUS | Status: DC
  Administered 2020-10-24 – 2020-10-25 (×2): 40 mg via SUBCUTANEOUS
  Filled 2020-10-23 (×3): qty 0.4

## 2020-10-23 MED ORDER — LACTATED RINGERS IV SOLN: INTRAVENOUS | Status: DC

## 2020-10-23 MED ORDER — CHLORHEXIDINE GLUCONATE 0.12 % MT SOLN
15.0000 mL | Freq: Once | OROMUCOSAL | Status: AC
  Filled 2020-10-23: qty 15

## 2020-10-23 MED ORDER — ONDANSETRON HCL 4 MG/2ML IJ SOLN: 4.0000 mg | Freq: Four times a day (QID) | INTRAMUSCULAR | Status: DC | PRN

## 2020-10-23 MED ORDER — ONDANSETRON HCL 4 MG/2ML IJ SOLN
INTRAMUSCULAR | Status: DC | PRN
  Administered 2020-10-23: 4 mg via INTRAVENOUS

## 2020-10-23 MED ORDER — MIDAZOLAM HCL 2 MG/2ML IJ SOLN
INTRAMUSCULAR | Status: AC
  Filled 2020-10-23: qty 2

## 2020-10-23 MED ORDER — CEFAZOLIN SODIUM-DEXTROSE 2-4 GM/100ML-% IV SOLN
INTRAVENOUS | Status: AC
  Filled 2020-10-23: qty 100

## 2020-10-23 MED ORDER — VITAMIN D 25 MCG (1000 UNIT) PO TABS
2000.0000 [IU] | ORAL_TABLET | Freq: Every day | ORAL | Status: DC
  Administered 2020-10-24 – 2020-10-26 (×3): 2000 [IU] via ORAL
  Filled 2020-10-23 (×3): qty 2

## 2020-10-23 MED ORDER — HYDROMORPHONE HCL 1 MG/ML IJ SOLN
INTRAMUSCULAR | Status: AC
  Filled 2020-10-23: qty 1

## 2020-10-23 MED ORDER — FENTANYL CITRATE (PF) 100 MCG/2ML IJ SOLN
INTRAMUSCULAR | Status: DC | PRN
  Administered 2020-10-23 (×2): 50 ug via INTRAVENOUS
  Administered 2020-10-23: 100 ug via INTRAVENOUS
  Administered 2020-10-23: 25 ug via INTRAVENOUS

## 2020-10-23 MED ORDER — FENTANYL CITRATE (PF) 250 MCG/5ML IJ SOLN
INTRAMUSCULAR | Status: AC
  Filled 2020-10-23: qty 5

## 2020-10-23 MED ORDER — 0.9 % SODIUM CHLORIDE (POUR BTL) OPTIME
TOPICAL | Status: DC | PRN
  Administered 2020-10-23: 1000 mL

## 2020-10-23 MED ORDER — CEFAZOLIN SODIUM 1 G IJ SOLR
INTRAMUSCULAR | Status: AC
  Filled 2020-10-23: qty 20

## 2020-10-23 MED ORDER — ONDANSETRON HCL 4 MG/2ML IJ SOLN: 4.0000 mg | Freq: Once | INTRAMUSCULAR | Status: DC | PRN

## 2020-10-23 MED ORDER — DEXAMETHASONE SODIUM PHOSPHATE 10 MG/ML IJ SOLN
INTRAMUSCULAR | Status: DC | PRN
  Administered 2020-10-23: 10 mg via INTRAVENOUS

## 2020-10-23 MED ORDER — DOCUSATE SODIUM 100 MG PO CAPS
100.0000 mg | ORAL_CAPSULE | Freq: Two times a day (BID) | ORAL | Status: DC
  Administered 2020-10-24 – 2020-10-26 (×5): 100 mg via ORAL
  Filled 2020-10-23 (×6): qty 1

## 2020-10-23 MED ORDER — ASCORBIC ACID 500 MG PO TABS
500.0000 mg | ORAL_TABLET | Freq: Every day | ORAL | Status: DC
  Administered 2020-10-24 – 2020-10-26 (×3): 500 mg via ORAL
  Filled 2020-10-23 (×3): qty 1

## 2020-10-23 MED ORDER — CEFAZOLIN SODIUM-DEXTROSE 2-4 GM/100ML-% IV SOLN
2.0000 g | Freq: Once | INTRAVENOUS | Status: AC
  Administered 2020-10-23 (×2): 2 g via INTRAVENOUS

## 2020-10-23 MED ORDER — PROPOFOL 10 MG/ML IV BOLUS
INTRAVENOUS | Status: DC | PRN
  Administered 2020-10-23: 80 mg via INTRAVENOUS

## 2020-10-23 MED ORDER — CEFAZOLIN SODIUM-DEXTROSE 1-4 GM/50ML-% IV SOLN
1.0000 g | Freq: Four times a day (QID) | INTRAVENOUS | Status: AC
  Administered 2020-10-24 (×3): 1 g via INTRAVENOUS
  Filled 2020-10-23 (×3): qty 50

## 2020-10-23 MED ORDER — PROPOFOL 10 MG/ML IV BOLUS
INTRAVENOUS | Status: AC
  Filled 2020-10-23: qty 20

## 2020-10-23 MED ORDER — ONDANSETRON HCL 4 MG PO TABS: 4.0000 mg | ORAL_TABLET | Freq: Four times a day (QID) | ORAL | Status: DC | PRN

## 2020-10-23 MED ORDER — MEPERIDINE HCL 25 MG/ML IJ SOLN: 6.2500 mg | INTRAMUSCULAR | Status: DC | PRN

## 2020-10-23 MED ORDER — LORAZEPAM 2 MG/ML IJ SOLN
0.2500 mg | Freq: Once | INTRAMUSCULAR | Status: AC
  Administered 2020-10-23: 0.25 mg via INTRAVENOUS

## 2020-10-23 MED ORDER — LIDOCAINE 2% (20 MG/ML) 5 ML SYRINGE
INTRAMUSCULAR | Status: AC
  Filled 2020-10-23: qty 10

## 2020-10-23 MED ORDER — SUGAMMADEX SODIUM 200 MG/2ML IV SOLN
INTRAVENOUS | Status: DC | PRN
  Administered 2020-10-23: 160 mg via INTRAVENOUS

## 2020-10-23 MED ORDER — LIDOCAINE 2% (20 MG/ML) 5 ML SYRINGE
INTRAMUSCULAR | Status: DC | PRN
  Administered 2020-10-23: 100 mg via INTRAVENOUS

## 2020-10-23 SURGICAL SUPPLY — 142 items
APL SKNCLS STERI-STRIP NONHPOA (GAUZE/BANDAGES/DRESSINGS)
BENZOIN TINCTURE PRP APPL 2/3 (GAUZE/BANDAGES/DRESSINGS) IMPLANT
BIT DRILL 2.0 LNG QUCK RELEASE (BIT) ×2 IMPLANT
BIT DRILL 2.5 X LONG (BIT) ×2
BIT DRILL 2.5X110 QC LCP DISP (BIT) ×4 IMPLANT
BIT DRILL 2.8 (BIT) ×2
BIT DRILL 2.8 QUICK RELEASE (BIT) ×2 IMPLANT
BIT DRILL CANN QC 2.8X165 (BIT) ×2 IMPLANT
BIT DRILL QC 2X140 (BIT) ×4 IMPLANT
BIT DRILL X LONG 2.5 (BIT) ×2 IMPLANT
BLADE AVERAGE 25MMX9MM (BLADE) ×1
BLADE AVERAGE 25X9 (BLADE) ×3 IMPLANT
BLADE CLIPPER SURG (BLADE) IMPLANT
BLADE SURG 10 STRL SS (BLADE) IMPLANT
BNDG CMPR 9X4 STRL LF SNTH (GAUZE/BANDAGES/DRESSINGS) ×2
BNDG CMPR STD VLCR NS LF 5.8X3 (GAUZE/BANDAGES/DRESSINGS) ×2
BNDG CMPR STD VLCR NS LF 5.8X4 (GAUZE/BANDAGES/DRESSINGS) ×2
BNDG COHESIVE 4X5 TAN STRL (GAUZE/BANDAGES/DRESSINGS) ×4 IMPLANT
BNDG ELASTIC 3X5.8 VLCR NS LF (GAUZE/BANDAGES/DRESSINGS) ×4 IMPLANT
BNDG ELASTIC 3X5.8 VLCR STR LF (GAUZE/BANDAGES/DRESSINGS) ×4 IMPLANT
BNDG ELASTIC 4X5.8 VLCR NS LF (GAUZE/BANDAGES/DRESSINGS) ×4 IMPLANT
BNDG ELASTIC 4X5.8 VLCR STR LF (GAUZE/BANDAGES/DRESSINGS) ×4 IMPLANT
BNDG ELASTIC 6X5.8 VLCR STR LF (GAUZE/BANDAGES/DRESSINGS) ×4 IMPLANT
BNDG ESMARK 4X9 LF (GAUZE/BANDAGES/DRESSINGS) ×4 IMPLANT
BNDG GAUZE ELAST 4 BULKY (GAUZE/BANDAGES/DRESSINGS) ×8 IMPLANT
BRUSH SCRUB EZ PLAIN DRY (MISCELLANEOUS) ×16 IMPLANT
CLEANER TIP ELECTROSURG 2X2 (MISCELLANEOUS) ×4 IMPLANT
CLOSURE WOUND 1/2 X4 (GAUZE/BANDAGES/DRESSINGS)
CORD BIPOLAR FORCEPS 12FT (ELECTRODE) ×8 IMPLANT
COVER SURGICAL LIGHT HANDLE (MISCELLANEOUS) ×8 IMPLANT
COVER WAND RF STERILE (DRAPES) ×4 IMPLANT
CUFF TOURN SGL QUICK 18X4 (TOURNIQUET CUFF) IMPLANT
CUFF TOURN SGL QUICK 24 (TOURNIQUET CUFF)
CUFF TRNQT CYL 24X4X16.5-23 (TOURNIQUET CUFF) IMPLANT
DECANTER SPIKE VIAL GLASS SM (MISCELLANEOUS) IMPLANT
DRAIN PENROSE 0.25X18 (DRAIN) ×4 IMPLANT
DRAIN PENROSE 1/4X12 LTX STRL (WOUND CARE) IMPLANT
DRAPE C-ARM 42X72 X-RAY (DRAPES) ×8 IMPLANT
DRAPE C-ARMOR (DRAPES) ×4 IMPLANT
DRAPE HALF SHEET 40X57 (DRAPES) IMPLANT
DRAPE INCISE IOBAN 66X45 STRL (DRAPES) IMPLANT
DRAPE ORTHO SPLIT 77X108 STRL (DRAPES) ×8
DRAPE SURG ORHT 6 SPLT 77X108 (DRAPES) ×4 IMPLANT
DRAPE U-SHAPE 47X51 STRL (DRAPES) ×8 IMPLANT
DRILL 2.0 LNG QUICK RELEASE (BIT) ×4
DRILL 2.8 QUICK RELEASE (BIT) ×4
DRILL BIT 2.8MM (BIT) ×4
DRILL BIT X LONG 2.5 (BIT) ×4
DRSG ADAPTIC 3X8 NADH LF (GAUZE/BANDAGES/DRESSINGS) ×4 IMPLANT
DRSG EMULSION OIL 3X3 NADH (GAUZE/BANDAGES/DRESSINGS) IMPLANT
DRSG MEPITEL 4X7.2 (GAUZE/BANDAGES/DRESSINGS) ×8 IMPLANT
DRSG PAD ABDOMINAL 8X10 ST (GAUZE/BANDAGES/DRESSINGS) ×4 IMPLANT
ELECT REM PT RETURN 9FT ADLT (ELECTROSURGICAL) ×4
ELECTRODE REM PT RTRN 9FT ADLT (ELECTROSURGICAL) ×2 IMPLANT
EVACUATOR 1/8 PVC DRAIN (DRAIN) IMPLANT
FACESHIELD WRAPAROUND (MASK) IMPLANT
GAUZE SPONGE 4X4 12PLY STRL (GAUZE/BANDAGES/DRESSINGS) ×8 IMPLANT
GAUZE XEROFORM 1X8 LF (GAUZE/BANDAGES/DRESSINGS) ×4 IMPLANT
GAUZE XEROFORM 5X9 LF (GAUZE/BANDAGES/DRESSINGS) ×4 IMPLANT
GLOVE BIO SURGEON STRL SZ7.5 (GLOVE) ×16 IMPLANT
GLOVE BIO SURGEON STRL SZ8 (GLOVE) ×8 IMPLANT
GLOVE BIOGEL PI IND STRL 7.5 (GLOVE) ×4 IMPLANT
GLOVE BIOGEL PI IND STRL 8 (GLOVE) ×8 IMPLANT
GLOVE BIOGEL PI INDICATOR 7.5 (GLOVE) ×4
GLOVE BIOGEL PI INDICATOR 8 (GLOVE) ×8
GOWN STRL REUS W/ TWL LRG LVL3 (GOWN DISPOSABLE) ×16 IMPLANT
GOWN STRL REUS W/ TWL XL LVL3 (GOWN DISPOSABLE) ×6 IMPLANT
GOWN STRL REUS W/TWL LRG LVL3 (GOWN DISPOSABLE) ×32
GOWN STRL REUS W/TWL XL LVL3 (GOWN DISPOSABLE) ×12
GUIDEWIRE ORTH 6X062XTROC NS (WIRE) ×4 IMPLANT
GUIDEWIRE ORTHO 2.0X9 ST (WIRE) ×4 IMPLANT
K-WIRE .062 (WIRE) ×8
KIT BASIN OR (CUSTOM PROCEDURE TRAY) ×4 IMPLANT
KIT TURNOVER KIT B (KITS) ×4 IMPLANT
MANIFOLD NEPTUNE II (INSTRUMENTS) ×4 IMPLANT
NEEDLE HYPO 25X1 1.5 SAFETY (NEEDLE) IMPLANT
NS IRRIG 1000ML POUR BTL (IV SOLUTION) ×4 IMPLANT
PACK ORTHO EXTREMITY (CUSTOM PROCEDURE TRAY) ×8 IMPLANT
PAD ABD 8X10 STRL (GAUZE/BANDAGES/DRESSINGS) ×12 IMPLANT
PAD ARMBOARD 7.5X6 YLW CONV (MISCELLANEOUS) ×8 IMPLANT
PAD CAST 3X4 CTTN HI CHSV (CAST SUPPLIES) ×4 IMPLANT
PAD CAST 4YDX4 CTTN HI CHSV (CAST SUPPLIES) ×4 IMPLANT
PADDING CAST COTTON 3X4 STRL (CAST SUPPLIES) ×8
PADDING CAST COTTON 4X4 STRL (CAST SUPPLIES) ×8
PLATE COMP LOCK MED 77 4H RT (Plate) ×4 IMPLANT
PLATE COMP LOCK SHORT 69 1H RT (Plate) ×4 IMPLANT
PLATE LEFT OLECRANON 3HOLE (Plate) ×4 IMPLANT
SCREW CORTEX 3.5 24MM (Screw) ×4 IMPLANT
SCREW CORTEX 3.5 28MM (Screw) ×2 IMPLANT
SCREW CORTICAL 3.5X20MM (Screw) ×8 IMPLANT
SCREW HEX LOCK 2.7X16MM (Screw) ×4 IMPLANT
SCREW HEX LOCK 3.5X20MM (Screw) ×4 IMPLANT
SCREW LOCK 18X2.7X HEXALOBE (Screw) ×2 IMPLANT
SCREW LOCK 18X3.5X HEXALOBE (Screw) ×2 IMPLANT
SCREW LOCK CORT ST 3.5X24 (Screw) ×4 IMPLANT
SCREW LOCK CORT ST 3.5X28 (Screw) ×2 IMPLANT
SCREW LOCK T15 FT 22X3.5XST (Screw) ×2 IMPLANT
SCREW LOCK T8 22X2.7XST VA (Screw) ×4 IMPLANT
SCREW LOCK T8 24X2.7XSTVA (Screw) ×4 IMPLANT
SCREW LOCK VA ST 2.7X20 (Screw) ×4 IMPLANT
SCREW LOCK VA ST 2.7X26 (Screw) ×4 IMPLANT
SCREW LOCKING 2.7X18MM (Screw) ×4 IMPLANT
SCREW LOCKING 2.7X22MM (Screw) ×8 IMPLANT
SCREW LOCKING 2.7X24MM (Screw) ×8 IMPLANT
SCREW LOCKING 2.7X56MM (Screw) ×4 IMPLANT
SCREW LOCKING 3.5X18MM (Screw) ×4 IMPLANT
SCREW LOCKING 3.5X22 (Screw) ×4 IMPLANT
SCREW LOCKING 3.5X45MM (Screw) ×4 IMPLANT
SCREW LOCKING VA 2.7X50MM (Screw) ×4 IMPLANT
SCREW LOCKING VA 2.7X54MM (Screw) ×8 IMPLANT
SCREW NON LOCKING HEX 3.5X22 (Screw) ×4 IMPLANT
SCREW NONLOCKING 3.5X28MM (Screw) ×4 IMPLANT
SLING ARM FOAM STRAP XLG (SOFTGOODS) ×4 IMPLANT
SPLINT FIBERGLASS 4X30 (CAST SUPPLIES) ×8 IMPLANT
SPONGE LAP 18X18 RF (DISPOSABLE) ×8 IMPLANT
STAPLER VISISTAT 35W (STAPLE) ×4 IMPLANT
STOCKINETTE IMPERVIOUS 9X36 MD (GAUZE/BANDAGES/DRESSINGS) ×8 IMPLANT
STRIP CLOSURE SKIN 1/2X4 (GAUZE/BANDAGES/DRESSINGS) IMPLANT
SUCTION FRAZIER HANDLE 10FR (MISCELLANEOUS) ×2
SUCTION TUBE FRAZIER 10FR DISP (MISCELLANEOUS) ×2 IMPLANT
SUT ETHILON 2 0 FS 18 (SUTURE) ×4 IMPLANT
SUT ETHILON 3 0 PS 1 (SUTURE) ×8 IMPLANT
SUT PDS AB 2-0 CT1 27 (SUTURE) IMPLANT
SUT PROLENE 2 0 SH 30 (SUTURE) ×4 IMPLANT
SUT PROLENE 3 0 PS 2 (SUTURE) ×8 IMPLANT
SUT VIC AB 0 CT1 27 (SUTURE) ×8
SUT VIC AB 0 CT1 27XBRD ANBCTR (SUTURE) ×4 IMPLANT
SUT VIC AB 1 CT1 27 (SUTURE) ×4
SUT VIC AB 1 CT1 27XBRD ANBCTR (SUTURE) ×2 IMPLANT
SUT VIC AB 2-0 CT1 27 (SUTURE) ×8
SUT VIC AB 2-0 CT1 TAPERPNT 27 (SUTURE) ×4 IMPLANT
SUT VIC AB 2-0 CT3 27 (SUTURE) IMPLANT
SYR 5ML LL (SYRINGE) IMPLANT
SYR CONTROL 10ML LL (SYRINGE) ×4 IMPLANT
TOWEL GREEN STERILE (TOWEL DISPOSABLE) ×12 IMPLANT
TOWEL GREEN STERILE FF (TOWEL DISPOSABLE) ×4 IMPLANT
TRAY FOLEY MTR SLVR 16FR STAT (SET/KITS/TRAYS/PACK) ×4 IMPLANT
TUBE CONNECTING 12'X1/4 (SUCTIONS) ×2
TUBE CONNECTING 12X1/4 (SUCTIONS) ×6 IMPLANT
UNDERPAD 30X36 HEAVY ABSORB (UNDERPADS AND DIAPERS) ×8 IMPLANT
WATER STERILE IRR 1000ML POUR (IV SOLUTION) ×4 IMPLANT
YANKAUER SUCT BULB TIP NO VENT (SUCTIONS) ×8 IMPLANT

## 2020-10-23 NOTE — Anesthesia Postprocedure Evaluation (Signed)
Anesthesia Post Note  Patient: Natelie Ostrosky  Procedure(s) Performed: OPEN REDUCTION INTERNAL FIXATION (ORIF) ELBOW/OLECRANON FRACTURE (Right Elbow) OPEN REDUCTION INTERNAL FIXATION LEFT DISTAL HUMERUS FRACTURE (Left Elbow)     Patient location during evaluation: PACU Anesthesia Type: Regional and MAC Level of consciousness: awake Pain management: pain level controlled Vital Signs Assessment: post-procedure vital signs reviewed and stable Respiratory status: spontaneous breathing Cardiovascular status: stable Postop Assessment: no apparent nausea or vomiting Anesthetic complications: no   No complications documented.  Last Vitals:  Vitals:   10/23/20 2100 10/23/20 2115  BP: (!) 148/95   Pulse: 91 90  Resp: (!) 25 (!) 22  Temp:    SpO2: 93% 93%    Last Pain:  Vitals:   10/23/20 2115  TempSrc:   PainSc: 4                  Yasmene Salomone

## 2020-10-23 NOTE — Progress Notes (Signed)
Ring given to daughter at bedside

## 2020-10-23 NOTE — TOC CAGE-AID Note (Signed)
Transition of Care [facility]) - CAGE-AID Screening   Patient Details  Name: Kayla Carrillo MRN: [id number] Date of Birth: 10/09/1936  Clinical Narrative: Patient endorses social drinking, one can of beer occasionally. No concerns at this time.     CAGE-AID Screening:    Have You Ever Felt You Ought to Cut Down on Your Drinking or Drug Use?: No Have People Annoyed You By Critizing Your Drinking Or Drug Use?: No Have You Felt Bad Or Guilty About Your Drinking Or Drug Use?: No Have You Ever Had a Drink or Used Drugs First Thing In The Morning to Steady Your Nerves or to Get Rid of a Hangover?: No CAGE-AID Score: 0

## 2020-10-23 NOTE — Progress Notes (Addendum)
Progress Note  Patient Name: Kayla Carrillo Date of Encounter: 10/23/2020  Primary Cardiologist:  Rollene Rotunda, MD  Subjective   No CP, no SOB. Foot slipped on floor and she fell  Inpatient Medications    Scheduled Meds: . calcitRIOL  0.5 mcg Oral Daily  . carvedilol  6.25 mg Oral BID WC  . hydrALAZINE  25 mg Oral Q8H  . pantoprazole  40 mg Oral Daily  . sertraline  50 mg Oral Daily  . simvastatin  40 mg Oral Daily   Continuous Infusions: . lactated ringers 50 mL/hr at 10/23/20 0636   PRN Meds: acetaminophen **OR** acetaminophen, hydrALAZINE, meclizine, morphine injection **OR** morphine injection, [DISCONTINUED] ondansetron **OR** ondansetron (ZOFRAN) IV, polyethylene glycol   Vital Signs    Vitals:   10/22/20 1647 10/22/20 2023 10/22/20 2337 10/23/20 0352  BP: 140/65 (!) 164/95 (!) 138/95 (!) 157/72  Pulse: 70 88 82 75  Resp: Temp: 98 F (36.7 C) 98.7 F (37.1 C) 99.4 F (37.4 C) 98 F (36.7 C)  TempSrc: Oral Oral Oral Oral  SpO2: 95% 93% 94% 95%  Weight:      Height:        Intake/Output Summary (Last 24 hours) at 10/23/2020 0757 Last data filed at 10/22/2020 1730 Gross per 24 hour  Intake 653.33 ml  Output 500 ml  Net 153.33 ml   Filed Weights   10/21/20 1628  Weight: 82.1 kg   Last Weight  Most recent update: 10/21/2020  4:28 PM   Weight  82.1 kg (181 lb)           Weight change:    Telemetry    SR - Personally Reviewed  ECG    None today - Personally Reviewed  Physical Exam   General: Well developed, well nourished, female appearing in no acute distress. Head: Normocephalic, atraumatic.  Neck: Supple without bruits, JVD not elevated. Lungs:  Resp regular and unlabored, CTA. Heart: RRR, S1, S2, no S3, S4, or murmur; no rub. Abdomen: Soft, non-tender, non-distended with normoactive bowel sounds. No hepatomegaly. No rebound/guarding. No obvious abdominal masses. Extremities: No clubbing, cyanosis, no edema.  Distal pedal pulses are 2+ bilaterally. Both arms splinted, not disturbed Neuro: Alert and oriented X 3. Moves all extremities spontaneously. Psych: Normal affect.  Labs    Hematology Recent Labs  Lab 10/21/20 1850 10/22/20 0209  WBC 12.6* 11.6*  RBC 4.60 4.05  HGB 13.1 11.4*  HCT 40.5 35.8*  MCV 88.0 88.4  MCH 28.5 28.1  MCHC 32.3 31.8  RDW 13.8 13.7  PLT 202 198    Chemistry Recent Labs  Lab 10/21/20 1850 10/22/20 0209  NA 140 139  K 4.2 3.7  CL 106 104  CO2 24 25  GLUCOSE 109* 110*  BUN 18 13  CREATININE 0.83 0.72  CALCIUM 8.7* 8.5*  PROT  --  6.0*  ALBUMIN  --  3.1*  AST  --  14*  ALT  --  11  ALKPHOS  --  68  BILITOT  --  0.8  GFRNONAA >60 >60  ANIONGAP 10 10     High Sensitivity Troponin:  No results for input(s): TROPONINIHS in the last 720 hours.    BNPNo results for input(s): BNP, PROBNP in the last 168 hours.   DDimer No results for input(s): DDIMER in the last 168 hours.   Radiology    DG Elbow Complete Left  Result Date: 10/21/2020 CLINICAL DATA:  Left elbow pain after fall  EXAM: LEFT ELBOW - COMPLETE 3+ VIEW COMPARISON:  None. FINDINGS: Acute fracture of the olecranon process of the proximal ulna with 4 mm of fracture distraction. Fracture extends intra-articularly to the ulnotrochlear joint. There is a small elbow joint hemarthrosis. No additional fractures are identified. There is prominence of the radial tuberosity, nonspecific. Degenerative changes of the elbow joint. Enthesopathic changes at the medial and lateral humeral epicondyles. Prominent soft tissue swelling/hematoma overlying the fracture site at the posterior elbow. IMPRESSION: Left elbow: 1. Acute fracture of the olecranon process of the proximal ulna with 4 mm of fracture distraction. 2. Prominent soft tissue swelling/hematoma overlying the fracture site at the fracture site. Electronically Signed   By: Duanne GuessNicholas  Plundo D.O.   On: 10/21/2020 17:31   DG Elbow Complete  Right  Result Date: 10/21/2020 CLINICAL DATA:  Fall, right elbow pain EXAM: RIGHT ELBOW - COMPLETE 3+ VIEW COMPARISON:  None. FINDINGS: Acute transcondylar fracture of the distal right humerus with intra-articular extension. Slight anterior displacement and angulation at the fracture site. No fractures identified involving the proximal radius or ulna. There is a large elbow joint hemarthrosis. Diffuse soft tissue swelling. IMPRESSION: Right elbow: Acute transcondylar fracture of the distal right humerus with intra-articular extension and slight anterior displacement and angulation. Electronically Signed   By: Duanne GuessNicholas  Plundo D.O.   On: 10/21/2020 17:35   CT Head Wo Contrast  Result Date: 10/21/2020 CLINICAL DATA:  84 year old female with fall and head injury today. EXAM: CT HEAD WITHOUT CONTRAST TECHNIQUE: Contiguous axial images were obtained from the base of the skull through the vertex without intravenous contrast. COMPARISON:  None. FINDINGS: Brain: No evidence of acute infarction, hemorrhage, hydrocephalus, extra-axial collection or mass lesion/mass effect. Moderate periventricular white matter hypodensities are of uncertain chronicity but most likely represent chronic small-vessel white matter ischemic changes. Mild generalized cerebral atrophy is noted. Vascular: Carotid atherosclerotic calcifications are noted. Skull: Normal. Negative for fracture or focal lesion. Sinuses/Orbits: No acute finding. Other: None. IMPRESSION: 1. No evidence of acute intracranial abnormality. 2. Moderate periventricular white matter hypodensities - likely chronic small-vessel white matter ischemic changes. 3. Mild generalized cerebral atrophy. Electronically Signed   By: Harmon PierJeffrey  Hu M.D.   On: 10/21/2020 17:31   ECHOCARDIOGRAM COMPLETE  Result Date: 10/22/2020    ECHOCARDIOGRAM REPORT   Patient Name:   Kayla Carrillo Date of Exam: 10/22/2020 Medical Rec #:  [id number]          Height:       63.0 in Accession #:     [id number]         Weight:       181.0 lb Date of Birth:  09/26/1936          BSA:          1.853 m Patient Age:    84 years           BP:           146/65 mmHg Patient Gender: F                  HR:           75 bpm. Exam Location:  Inpatient Procedure: 2D Echo, Color Doppler and Cardiac Doppler Indications:    Pre-op Evaluation  History:        Patient has prior history of Echocardiogram examinations, most                 recent 06/17/2019. CHF; Risk Factors:Hypertension and  Dyslipidemia.  Sonographer:    Irving Burton Senior RDCS Referring Phys: [id number] Ellsworth Lennox IMPRESSIONS  1. Left ventricular ejection fraction, by estimation, is 50 to 55%. The left ventricle has low normal function. The left ventricle has no regional wall motion abnormalities. There is mild concentric left ventricular hypertrophy. Left ventricular diastolic parameters are consistent with Grade I diastolic dysfunction (impaired relaxation).  2. Right ventricular systolic function is normal. The right ventricular size is normal. Tricuspid regurgitation signal is inadequate for assessing PA pressure.  3. The mitral valve is degenerative. No evidence of mitral valve regurgitation. No evidence of mitral stenosis.  4. The aortic valve is grossly normal. Aortic valve regurgitation is not visualized. No aortic stenosis is present.  5. The inferior vena cava is normal in size with greater than 50% respiratory variability, suggesting right atrial pressure of 3 mmHg. Comparison(s): Prior images reviewed side by side. Changes from prior study are noted. The left ventricular function has improved. FINDINGS  Left Ventricle: Left ventricular ejection fraction, by estimation, is 50 to 55%. The left ventricle has low normal function. The left ventricle has no regional wall motion abnormalities. The left ventricular internal cavity size was normal in size. There is mild concentric left ventricular hypertrophy. Left ventricular diastolic  parameters are consistent with Grade I diastolic dysfunction (impaired relaxation). Right Ventricle: The right ventricular size is normal. No increase in right ventricular wall thickness. Right ventricular systolic function is normal. Tricuspid regurgitation signal is inadequate for assessing PA pressure. Left Atrium: Left atrial size was normal in size. Right Atrium: Right atrial size was normal in size. Pericardium: Trivial pericardial effusion is present. Presence of pericardial fat pad. Mitral Valve: The mitral valve is degenerative in appearance. Mild mitral annular calcification. No evidence of mitral valve regurgitation. No evidence of mitral valve stenosis. Tricuspid Valve: The tricuspid valve is grossly normal. Tricuspid valve regurgitation is trivial. No evidence of tricuspid stenosis. Aortic Valve: The aortic valve is grossly normal. Aortic valve regurgitation is not visualized. No aortic stenosis is present. Pulmonic Valve: The pulmonic valve was grossly normal. Pulmonic valve regurgitation is not visualized. No evidence of pulmonic stenosis. Aorta: The aortic root is normal in size and structure. Venous: The inferior vena cava is normal in size with greater than 50% respiratory variability, suggesting right atrial pressure of 3 mmHg. IAS/Shunts: The atrial septum is grossly normal.  LEFT VENTRICLE PLAX 2D LVIDd:         4.30 cm  Diastology LVIDs:         3.20 cm  LV e' medial:    4.68 cm/s LV PW:         1.30 cm  LV E/e' medial:  16.7 LV IVS:        1.30 cm  LV e' lateral:   5.33 cm/s LVOT diam:     1.90 cm  LV E/e' lateral: 14.6 LV SV:         54 LV SV Index:   29 LVOT Area:     2.84 cm  RIGHT VENTRICLE RV S prime:     11.40 cm/s TAPSE (M-mode): 2.2 cm LEFT ATRIUM             Index       RIGHT ATRIUM           Index LA diam:        3.60 cm 1.94 cm/m  RA Area:     16.80 cm LA Vol (A2C):   46.9 ml 25.31 ml/m  RA Volume:   41.20 ml  22.23 ml/m LA Vol (A4C):   65.3 ml 35.23 ml/m LA Biplane Vol: 58.2  ml 31.40 ml/m  AORTIC VALVE LVOT Vmax:   86.20 cm/s LVOT Vmean:  62.000 cm/s LVOT VTI:    0.189 m  AORTA Ao Root diam: 3.00 cm MITRAL VALVE MV Area (PHT): 3.21 cm     SHUNTS MV Decel Time: 236 msec     Systemic VTI:  0.19 m MV E velocity: 78.00 cm/s   Systemic Diam: 1.90 cm MV A velocity: 102.00 cm/s MV E/A ratio:  0.76 Lennie Odor MD Electronically signed by Lennie Odor MD Signature Date/Time: 10/22/2020/1:57:05 PM    Final      Cardiac Studies   ECHO:  10/22/2020 1. Left ventricular ejection fraction, by estimation, is 50 to 55%. The  left ventricle has low normal function. The left ventricle has no regional  wall motion abnormalities. There is mild concentric left ventricular  hypertrophy. Left ventricular diastolic parameters are consistent with Grade I diastolic dysfunction (impaired relaxation).  2. Right ventricular systolic function is normal. The right ventricular  size is normal. Tricuspid regurgitation signal is inadequate for assessing  PA pressure.  3. The mitral valve is degenerative. No evidence of mitral valve  regurgitation. No evidence of mitral stenosis.  4. The aortic valve is grossly normal. Aortic valve regurgitation is not  visualized. No aortic stenosis is present.  5. The inferior vena cava is normal in size with greater than 50%  respiratory variability, suggesting right atrial pressure of 3 mmHg.   Comparison(s): Prior images reviewed side by side. Changes from prior  study are noted. The left ventricular function has improved.   Patient Profile     84 y.o. female w/ hx takotsubo cardiomyopathy (EF 40-45% by echo in 2009, normalized to 55% in 2018 per [facility] and 40-45% by echo 05/2019), HLD and GERD, was admitted 10/24 after a fall (mechanical, was not using her walker) and needs surgery for olecranon process fracture of the left elbow as well as a transcondylar fracture of the distal right humerus.  Assessment & Plan    1. Fall, s/p  olecranon process fracture of the left elbow as well as a transcondylar fracture of the distal right humerus - for surgery today per Dr Carola Frost  2. Preop cards eval, hx Takotsubo CM - EF had normalized, but dropped to 45% by echo 05/2019 - echo results above, EF now 50-55% w/ grade 1 dd and no WMA - volume ok by exam - pt is at acceptable risk for the planned procedure.  [facility] will sign off if MD agrees.   Medication Recommendations:  Continue BB in the periop period Other recommendations (labs, testing, etc):  none Follow up as an outpatient:  We will arrange f/u with Dr. Antoine Poche  Otherwise, per IM Principal Problem:   Fall at home, initial encounter Active Problems:   Closed olecranon fracture, left, initial encounter   Closed displaced transcondylar fracture of right humerus   Essential hypertension   Mixed hyperlipidemia   Leukocytosis   Chronic systolic heart failure (HCC)   Signed, Theodore Demark , PA-C 7:57 AM 10/23/2020 Pager: [phone]  Personally seen and examined. Agree with above.   Okay to proceed with orthopedic surgery from a cardiac perspective.  EF now low normal 55% agree with continuation of the beta-blocker in perioperative period.  On exam pleasant, breathing comfortably, heart regular rate and rhythm, no rales on lung fields.  She  is comfortable.  No chest pain.  Lives at [place].  Please let us know if we can be of further assistance.  Donato Schultz, MD

## 2020-10-23 NOTE — Op Note (Addendum)
10/23/2020  8:44 PM  PATIENT:  Kayla Carrillo  84 y.o. female  PRE-OPERATIVE DIAGNOSIS:   1. RIGHT TRANSCONDYLAR DISTAL HUMERUS FRACTURE 2. LEFT OLECRANON FRACTURE  POST-OPERATIVE DIAGNOSIS:  1. RIGHT TRANSCONDYLAR DISTAL HUMERUS FRACTURE 2. LEFT OLECRANON FRACTURE  PROCEDURE:  Procedure(s): 1. OPEN REDUCTION INTERNAL FIXATION (ORIF) RIGHT TRANSCONDYLAR DISTAL HUMERUS FRACTURE 2. RIGHT ULNAR NERVE NEUROPLASTY 3. OPEN REDUCTION INTERNAL FIXATION LEFT (Left) OLECRANON FRACTURE  SURGEON:  Surgeon(s) and Role:    Kayla Galas, MD - Primary  PHYSICIAN ASSISTANT: 1. Kayla PAUL, Kayla-C; 2. Kayla Carrillo  ANESTHESIA:   general  EBL:  150 mL   BLOOD ADMINISTERED:none  DRAINS: none   LOCAL MEDICATIONS USED:  NONE  SPECIMEN:  No Specimen  DISPOSITION OF SPECIMEN:  N/A  COUNTS:  YES  TOURNIQUET:  * No tourniquets in log *  DICTATION: .Note written in EPIC  PLAN OF CARE: Admit to inpatient   PATIENT DISPOSITION:  PACU - hemodynamically stable.   Delay start of Pharmacological VTE agent (>24hrs) due to surgical blood loss or risk of bleeding: no   BRIEF INDICATIONS FOR PROCEDURE:  This is a very pleasant 84 y.o. right-hand dominant patient who sustained a combine transcondylar humerus fracture on the right and olecranon on the left in a ground-level fall. I discussed with the patient and her daughter the risks and benefits of surgical treatment including the possibility of nerve injury, vessel injury, DVT, PE, loss of motion, nonunion, malunion, symptomatic hardware, need for further surgery, heart attack, stroke, death, and multiple others. These risks were acknowledged and consent provided to proceed.    BRIEF SUMMARY OF PROCEDURE:  The patient was taken to the operating room after administration of preoperative Ancef.  The patient was placed laterally with prominences appropriately padded and the right operative upper extremity placed carefully over bone foam.  Chlorhexidine wash followed by Betadine scrub and paint were used then a standard drape. A tourniquet was placed about the arm. Time out was called. The arm was then elevated and exsanguinated with an Esmarch bandage.  We decided to perform a posterior approach with mobilization of the triceps without an olecranon osteotomy.  A posterior incision was made.  Dissection carried carefully down, with hemostasis using electrocautery.  The triceps was mobilized medially after identification and neuroplasty of the ulnar nerve which was carefully retracted with a quarter inch penrose and facilitated with the bipolar bovie.  An additional lateral incision in the triceps fascia was made as well, and this facilitated mobilization laterally.  On the lateral side once this interval had been established, we placed a lap sponge underneath the triceps, mobilizing it, and enabling direct exposure of the fracture site.  The fracture was again an intercondylar split with significant extension through the lateral side.  Bone quality was reduced but better than anticipated and outstanding for age. Tenaculums were used for provisional reduction then plate placement which was checked by fluoro. Once this had been established, I was then able to use the Synthes medial and lateral column plate and obtained outstanding reduction and compression across the articular surface and fixation with a mixture of standard and locked screw fixation.  Kayla Morita, Kayla-C, was present and assisting throughout.  We were able to take the elbow through a full range of motion with no complications.  Final images showed appropriate reduction, hardware placement, trajectory, and length.  Of note, an assistant was absolutely necessary for control during this very difficult reduction and assistance with maintenance during both provisional  and definitive fixation.  Furthermore, he protected the ulnar nerve throughout the instrumentation.   We then redraped and  prepped the left side. The left was elevated on a towel bump by my assistant.  No tourniquet was used during the procedure.  A chlorhexidine scrub and then a Betadine scrub and paint were used.  A timeout was held after standard draping, and then a slightly off midline incision made.  Dissection was carried down through subcutaneous tissue to the fracture site and disrupted extensor. Hematoma was evacuated, and the fracture ends were cleaned thoroughly, and then reduction with a large tenaculum, supplemented by pin fixation.  I proceeded with internal fixation using an Acumed olecranon plate using a combination of standard and locked fixation with 2.7 and 3.5 mm screws and was able to achieve excellent compression and fixation. An assistant was present and participating throughout to hold portions of the provisional reduction while I finetuned the reduction, and then also to maintain the provisional reduction while I performed final instrumentation and fixation.  He also assisted me with wound closure, which was done with a 0 Vicryl, 2-0 Vicryl and a 2-0 nylon. Dressings and long arm splints were applied to both sides and the patient taken to the PACU in stable condition.  PROGNOSIS: As a precaution until she can follow commands well Kayla Carrillo has been placed in bilateral long arm splints which will be very difficult to manage though she does have AROM of the wrist and digits. We hope to begin gentle PROM, AAROM of the elbow on both sides as we better understand the limits of her mental status and function. Ice and elevation with "the hand of above the elbow and the elbow above the heart" to the extent possible. Follow up in the office in 10-14 days for removal of sutures and ROM advancement with therapy. There is increased risk for arthritis given the intraarticular injury, as well as loss of motion and symptomatic hardware which has been discussed.

## 2020-10-23 NOTE — Progress Notes (Addendum)
PROGRESS NOTE                                                                                                                                                                                                             Patient Demographics:    Kayla Carrillo, is a 84 y.o. female, DOB - 05/04/1936, JXB:[id number]  Outpatient Primary MD for the patient is Docia Chuck, Dibas, MD    LOS - 2  Admit date - 10/21/2020    Chief Complaint  Patient presents with   Fall       Brief Narrative (HPI from H&P) -  84 year old female with past medical history of hypertension, Takotsubo cardiomyopathy, gastroesophageal reflux disease, hyperlipidemia who presents to [facility] emergency department via EMS after patient fell injuring her arms, in the ER her work-up showed olecranon process fracture of the left elbow as well as a transcondylar fracture of the distal right humerus, orthopedics was consulted and hospitalist were requested to admit the patient.    Subjective:   Patient in bed, appears comfortable, denies any headache, no fever, no chest pain or pressure, no shortness of breath , no abdominal pain. No focal weakness.   Assessment  & Plan :     1. Ankle fall with bilateral elbow fractures - olecranon process fracture of the left elbow as well as a transcondylar fracture of the distal right humerus - currently both arms in splint, orthopedics preparing for surgical correction in the next day or so.  She will be moderate risk for adverse cardiopulmonary outcome.  Patient accepts the risk and wants to proceed with surgical procedure if needed.  Cardiology also evaluated the patient for the same reason.  Encouraged the patient to sit up in chair in the daytime use I-S and flutter valve for pulmonary toiletry.   2.  Recent history of Takotsubo cardiomyopathy with left bundle branch block.  Currently appears compensated, good  exercise tolerance at baseline able to climb a flight of stairs without any chest discomfort, continue Coreg, statin along with hydralazine.  Repeat echocardiogram shows improvement in EF to 50% now without any wall motion abnormality, symptom-free, cardiology on board she follows with Dr. Rollene Rotunda.  3.  Essential hypertension.  On Coreg and hydralazine combination, as needed IV hydralazine as needed.   4.  Stress related leukocytosis.  Monitor.  5.  Dyslipidemia.  On statin.     Condition - Fair  Family Communication  : Called daughter [phone] on 1024 12:18 PM.  Micah Flesher to answering machine, called again 10/23/2020 at 11:37 AM.  This is the wrong number listed in the chart.  Talked with daughter in the room phone number is Chyrl Civatte [phone] - 10/23/20   Code Status :  Full  Consults  :  Ortho, Cards  Procedures  :    TTE - EF has now improved to 50% from 45% in 06/19/2019, grade 1 chronic diastolic CHF, no wall motion abnormality.  PUD Prophylaxis : PPI  Disposition Plan  :    Status is: Inpatient  Remains inpatient appropriate because:Inpatient level of care appropriate due to severity of illness   Dispo: The patient is from: Home              Anticipated d/c is to: SNF              Anticipated d/c date is: > 3 days              Patient currently is not medically stable to d/c.   DVT Prophylaxis  :  Heparin   Lab Results  Component Value Date   PLT 198 10/22/2020    Diet :  Diet Order            Diet NPO time specified Except for: Sips with Meds  Diet effective midnight                  Inpatient Medications  Scheduled Meds:  calcitRIOL  0.5 mcg Oral Daily   carvedilol  6.25 mg Oral BID WC   hydrALAZINE  25 mg Oral Q8H   pantoprazole  40 mg Oral Daily   sertraline  50 mg Oral Daily   simvastatin  40 mg Oral Daily   Continuous Infusions:  lactated ringers     PRN Meds:.acetaminophen **OR** acetaminophen, hydrALAZINE, meclizine,  morphine injection **OR** morphine injection, [DISCONTINUED] ondansetron **OR** ondansetron (ZOFRAN) IV, polyethylene glycol  Antibiotics  :    Anti-infectives (From admission, onward)   None       Time Spent in minutes  30   Susa Raring M.D on 10/23/2020 at 11:37 AM  To page go to [email or web address] - password [facility]  [facility] -  Office  [phone]    See all Orders from today for further details    Objective:   Vitals:   10/22/20 2023 10/22/20 2337 10/23/20 0352 10/23/20 0809  BP: (!) 164/95 (!) 138/95 (!) 157/72 (!) 158/86  Pulse: 88 82 75 75  Resp: 16  20 18   Temp: 98.7 F (37.1 C) 99.4 F (37.4 C) 98 F (36.7 C) 98 F (36.7 C)  TempSrc: Oral Oral Oral Oral  SpO2: 93% 94% 95% 95%  Weight:      Height:        Wt Readings from Last 3 Encounters:  10/21/20 82.1 kg  07/15/19 83.9 kg  06/29/19 83.6 kg     Intake/Output Summary (Last 24 hours) at 10/23/2020 1137 Last data filed at 10/23/2020 0800 Gross per 24 hour  Intake 440 ml  Output 600 ml  Net -160 ml     Physical Exam  Awake Alert, No new F.N deficits, Normal affect [place].AT,PERRAL Supple Neck,No JVD, No cervical lymphadenopathy appriciated.  Symmetrical Chest wall movement, Good air movement bilaterally, CTAB RRR,No Gallops, Rubs or new Murmurs, No Parasternal Heave +ve B.Sounds, Abd Soft, No  tenderness, No organomegaly appriciated, No rebound - guarding or rigidity. No Cyanosis,  both elbows under splint,    Data Review:    CBC Recent Labs  Lab 10/21/20 1850 10/22/20 0209  WBC 12.6* 11.6*  HGB 13.1 11.4*  HCT 40.5 35.8*  PLT 202 198  MCV 88.0 88.4  MCH 28.5 28.1  MCHC 32.3 31.8  RDW 13.8 13.7  LYMPHSABS  --  2.8  MONOABS  --  1.0  EOSABS  --  0.3  BASOSABS  --  0.1    Recent Labs  Lab 10/21/20 1850 10/22/20 0209  NA 140 139  K 4.2 3.7  CL 106 104  CO2 24 25  GLUCOSE 109* 110*  BUN 18 13  CREATININE 0.83 0.72  CALCIUM 8.7* 8.5*  AST  --  14*  ALT  --  11    ALKPHOS  --  68  BILITOT  --  0.8  ALBUMIN  --  3.1*  MG  --  1.9  INR  --  1.2  TSH  --  2.560    ------------------------------------------------------------------------------------------------------------------ No results for input(s): CHOL, HDL, LDLCALC, TRIG, CHOLHDL, LDLDIRECT in the last 72 hours.  No results found for: HGBA1C ------------------------------------------------------------------------------------------------------------------ Recent Labs    10/22/20 0209  TSH 2.560    Cardiac Enzymes No results for input(s): CKMB, TROPONINI, MYOGLOBIN in the last 168 hours.  Invalid input(s): CK ------------------------------------------------------------------------------------------------------------------ No results found for: BNP  Micro Results Recent Results (from the past 240 hour(s))  Respiratory Panel by RT PCR (Flu A&B, Covid) - Nasopharyngeal Swab     Status: None   Collection Time: 10/21/20  6:50 PM   Specimen: Nasopharyngeal Swab  Result Value Ref Range Status   SARS Coronavirus 2 by RT PCR NEGATIVE NEGATIVE Final    Comment: (NOTE) SARS-CoV-2 target nucleic acids are NOT DETECTED.  The SARS-CoV-2 RNA is generally detectable in upper respiratoy specimens during the acute phase of infection. The lowest concentration of SARS-CoV-2 viral copies this assay can detect is 131 copies/mL. A negative result does not preclude SARS-Cov-2 infection and should not be used as the sole basis for treatment or other patient management decisions. A negative result may occur with  improper specimen collection/handling, submission of specimen other than nasopharyngeal swab, presence of viral mutation(s) within the areas targeted by this assay, and inadequate number of viral copies (<131 copies/mL). A negative result must be combined with clinical observations, patient history, and epidemiological information. The expected result is Negative.  Fact Sheet for Patients:   https://www.moore.com/  Fact Sheet for Healthcare Providers:  https://www.young.biz/  This test is no t yet approved or cleared by the [place] FDA and  has been authorized for detection and/or diagnosis of SARS-CoV-2 by FDA under an Emergency Use Authorization (EUA). This EUA will remain  in effect (meaning this test can be used) for the duration of the COVID-19 declaration under Section 564(b)(1) of the Act, 21 U.S.C. section 360bbb-3(b)(1), unless the authorization is terminated or revoked sooner.     Influenza A by PCR NEGATIVE NEGATIVE Final   Influenza B by PCR NEGATIVE NEGATIVE Final    Comment: (NOTE) The Xpert Xpress SARS-CoV-2/FLU/RSV assay is intended as an aid in  the diagnosis of influenza from Nasopharyngeal swab specimens and  should not be used as a sole basis for treatment. Nasal washings and  aspirates are unacceptable for Xpert Xpress SARS-CoV-2/FLU/RSV  testing.  Fact Sheet for Patients: https://www.moore.com/  Fact Sheet for Healthcare Providers: https://www.young.biz/  This test  is not yet approved or cleared by the [place] and  has been authorized for detection and/or diagnosis of SARS-CoV-2 by  FDA under an Emergency Use Authorization (EUA). This EUA will remain  in effect (meaning this test can be used) for the duration of the  Covid-19 declaration under Section 564(b)(1) of the Act, 21  U.S.C. section 360bbb-3(b)(1), unless the authorization is  terminated or revoked. Performed at [facility], [address], [place], [place] [address]   Surgical pcr screen     Status: None   Collection Time: 10/22/20 12:04 AM   Specimen: Nasal Mucosa; Nasal Swab  Result Value Ref Range Status   MRSA, PCR NEGATIVE NEGATIVE Final   Staphylococcus aureus NEGATIVE NEGATIVE Final    Comment: (NOTE) The Xpert SA Assay (FDA approved for NASAL specimens in patients  67 years of age and older), is one component of a comprehensive surveillance program. It is not intended to diagnose infection nor to guide or monitor treatment. Performed at [facility], [address] [place], [place] [address]     Radiology Reports DG Elbow Complete Left  Result Date: 10/21/2020 CLINICAL DATA:  Left elbow pain after fall EXAM: LEFT ELBOW - COMPLETE 3+ VIEW COMPARISON:  None. FINDINGS: Acute fracture of the olecranon process of the proximal ulna with 4 mm of fracture distraction. Fracture extends intra-articularly to the ulnotrochlear joint. There is a small elbow joint hemarthrosis. No additional fractures are identified. There is prominence of the radial tuberosity, nonspecific. Degenerative changes of the elbow joint. Enthesopathic changes at the medial and lateral humeral epicondyles. Prominent soft tissue swelling/hematoma overlying the fracture site at the posterior elbow. IMPRESSION: Left elbow: 1. Acute fracture of the olecranon process of the proximal ulna with 4 mm of fracture distraction. 2. Prominent soft tissue swelling/hematoma overlying the fracture site at the fracture site. Electronically Signed   By: Duanne Guess D.O.   On: 10/21/2020 17:31   DG Elbow Complete Right  Result Date: 10/21/2020 CLINICAL DATA:  Fall, right elbow pain EXAM: RIGHT ELBOW - COMPLETE 3+ VIEW COMPARISON:  None. FINDINGS: Acute transcondylar fracture of the distal right humerus with intra-articular extension. Slight anterior displacement and angulation at the fracture site. No fractures identified involving the proximal radius or ulna. There is a large elbow joint hemarthrosis. Diffuse soft tissue swelling. IMPRESSION: Right elbow: Acute transcondylar fracture of the distal right humerus with intra-articular extension and slight anterior displacement and angulation. Electronically Signed   By: Duanne Guess D.O.   On: 10/21/2020 17:35   CT Head Wo Contrast  Result Date:  10/21/2020 CLINICAL DATA:  84 year old female with fall and head injury today. EXAM: CT HEAD WITHOUT CONTRAST TECHNIQUE: Contiguous axial images were obtained from the base of the skull through the vertex without intravenous contrast. COMPARISON:  None. FINDINGS: Brain: No evidence of acute infarction, hemorrhage, hydrocephalus, extra-axial collection or mass lesion/mass effect. Moderate periventricular white matter hypodensities are of uncertain chronicity but most likely represent chronic small-vessel white matter ischemic changes. Mild generalized cerebral atrophy is noted. Vascular: Carotid atherosclerotic calcifications are noted. Skull: Normal. Negative for fracture or focal lesion. Sinuses/Orbits: No acute finding. Other: None. IMPRESSION: 1. No evidence of acute intracranial abnormality. 2. Moderate periventricular white matter hypodensities - likely chronic small-vessel white matter ischemic changes. 3. Mild generalized cerebral atrophy. Electronically Signed   By: Harmon Pier M.D.   On: 10/21/2020 17:31   ECHOCARDIOGRAM COMPLETE  Result Date: 10/22/2020    ECHOCARDIOGRAM  REPORT   Patient Name:   Jeananne RamaBARBARA Carrillo Date of Exam: 10/22/2020 Medical Rec #:  [id number]          Height:       63.0 in Accession #:    [id number]         Weight:       181.0 lb Date of Birth:  07-16-1936          BSA:          1.853 m Patient Age:    84 years           BP:           146/65 mmHg Patient Gender: F                  HR:           75 bpm. Exam Location:  Inpatient Procedure: 2D Echo, Color Doppler and Cardiac Doppler Indications:    Pre-op Evaluation  History:        Patient has prior history of Echocardiogram examinations, most                 recent 06/17/2019. CHF; Risk Factors:Hypertension and                 Dyslipidemia.  Sonographer:    Irving BurtonEmily Senior RDCS Referring Phys: [id number] Ellsworth LennoxBRITTANY M STRADER IMPRESSIONS  1. Left ventricular ejection fraction, by estimation, is 50 to 55%. The left ventricle has low normal  function. The left ventricle has no regional wall motion abnormalities. There is mild concentric left ventricular hypertrophy. Left ventricular diastolic parameters are consistent with Grade I diastolic dysfunction (impaired relaxation).  2. Right ventricular systolic function is normal. The right ventricular size is normal. Tricuspid regurgitation signal is inadequate for assessing PA pressure.  3. The mitral valve is degenerative. No evidence of mitral valve regurgitation. No evidence of mitral stenosis.  4. The aortic valve is grossly normal. Aortic valve regurgitation is not visualized. No aortic stenosis is present.  5. The inferior vena cava is normal in size with greater than 50% respiratory variability, suggesting right atrial pressure of 3 mmHg. Comparison(s): Prior images reviewed side by side. Changes from prior study are noted. The left ventricular function has improved. FINDINGS  Left Ventricle: Left ventricular ejection fraction, by estimation, is 50 to 55%. The left ventricle has low normal function. The left ventricle has no regional wall motion abnormalities. The left ventricular internal cavity size was normal in size. There is mild concentric left ventricular hypertrophy. Left ventricular diastolic parameters are consistent with Grade I diastolic dysfunction (impaired relaxation). Right Ventricle: The right ventricular size is normal. No increase in right ventricular wall thickness. Right ventricular systolic function is normal. Tricuspid regurgitation signal is inadequate for assessing PA pressure. Left Atrium: Left atrial size was normal in size. Right Atrium: Right atrial size was normal in size. Pericardium: Trivial pericardial effusion is present. Presence of pericardial fat pad. Mitral Valve: The mitral valve is degenerative in appearance. Mild mitral annular calcification. No evidence of mitral valve regurgitation. No evidence of mitral valve stenosis. Tricuspid Valve: The tricuspid valve  is grossly normal. Tricuspid valve regurgitation is trivial. No evidence of tricuspid stenosis. Aortic Valve: The aortic valve is grossly normal. Aortic valve regurgitation is not visualized. No aortic stenosis is present. Pulmonic Valve: The pulmonic valve was grossly normal. Pulmonic valve regurgitation is not visualized. No evidence of pulmonic stenosis. Aorta: The aortic root is normal in size and structure. Venous:  The inferior vena cava is normal in size with greater than 50% respiratory variability, suggesting right atrial pressure of 3 mmHg. IAS/Shunts: The atrial septum is grossly normal.  LEFT VENTRICLE PLAX 2D LVIDd:         4.30 cm  Diastology LVIDs:         3.20 cm  LV e' medial:    4.68 cm/s LV PW:         1.30 cm  LV E/e' medial:  16.7 LV IVS:        1.30 cm  LV e' lateral:   5.33 cm/s LVOT diam:     1.90 cm  LV E/e' lateral: 14.6 LV SV:         54 LV SV Index:   29 LVOT Area:     2.84 cm  RIGHT VENTRICLE RV S prime:     11.40 cm/s TAPSE (M-mode): 2.2 cm LEFT ATRIUM             Index       RIGHT ATRIUM           Index LA diam:        3.60 cm 1.94 cm/m  RA Area:     16.80 cm LA Vol (A2C):   46.9 ml 25.31 ml/m RA Volume:   41.20 ml  22.23 ml/m LA Vol (A4C):   65.3 ml 35.23 ml/m LA Biplane Vol: 58.2 ml 31.40 ml/m  AORTIC VALVE LVOT Vmax:   86.20 cm/s LVOT Vmean:  62.000 cm/s LVOT VTI:    0.189 m  AORTA Ao Root diam: 3.00 cm MITRAL VALVE MV Area (PHT): 3.21 cm     SHUNTS MV Decel Time: 236 msec     Systemic VTI:  0.19 m MV E velocity: 78.00 cm/s   Systemic Diam: 1.90 cm MV A velocity: 102.00 cm/s MV E/A ratio:  0.76 Lennie Odor MD Electronically signed by Lennie Odor MD Signature Date/Time: 10/22/2020/1:57:05 PM    Final

## 2020-10-23 NOTE — Anesthesia Preprocedure Evaluation (Addendum)
Anesthesia Evaluation  Patient identified by MRN, date of birth, ID band Patient awake    Reviewed: Allergy & Precautions, NPO status , Patient's Chart, lab work & pertinent test results  Airway Mallampati: I  TM Distance: >3 FB Neck ROM: Full    Dental   Pulmonary    Pulmonary exam normal        Cardiovascular hypertension, Pt. on medications and Pt. on home beta blockers Normal cardiovascular exam     Neuro/Psych Anxiety Depression    GI/Hepatic GERD  Medicated and Controlled,  Endo/Other    Renal/GU      Musculoskeletal   Abdominal   Peds  Hematology   Anesthesia Other Findings   Reproductive/Obstetrics                             Anesthesia Physical Anesthesia Plan  ASA: III  Anesthesia Plan: General   Post-op Pain Management:    Induction: Intravenous  PONV Risk Score and Plan: 3 and Ondansetron and Treatment may vary due to age or medical condition  Airway Management Planned: Oral ETT  Additional Equipment:   Intra-op Plan:   Post-operative Plan: Extubation in OR  Informed Consent: I have reviewed the patients History and Physical, chart, labs and discussed the procedure including the risks, benefits and alternatives for the proposed anesthesia with the patient or authorized representative who has indicated his/her understanding and acceptance.       Plan Discussed with: CRNA and Surgeon  Anesthesia Plan Comments:        Anesthesia Quick Evaluation

## 2020-10-23 NOTE — Transfer of Care (Signed)
Immediate Anesthesia Transfer of Care Note  Patient: Arien Morine  Procedure(s) Performed: OPEN REDUCTION INTERNAL FIXATION (ORIF) ELBOW/OLECRANON FRACTURE (Right Elbow) OPEN REDUCTION INTERNAL FIXATION LEFT DISTAL HUMERUS FRACTURE (Left Elbow)  Patient Location: PACU  Anesthesia Type:General  Level of Consciousness: drowsy and responds to stimulation  Airway & Oxygen Therapy: Patient Spontanous Breathing and Patient connected to nasal cannula oxygen  Post-op Assessment: Report given to RN and Post -op Vital signs reviewed and stable  Post vital signs: Reviewed and stable  Last Vitals:  Vitals Value Taken Time  BP 156/77 10/23/20 2049  Temp    Pulse 90 10/23/20 2053  Resp 29 10/23/20 2053  SpO2 93 % 10/23/20 2053  Vitals shown include unvalidated device data.  Last Pain:  Vitals:   10/23/20 0809  TempSrc: Oral  PainSc: Asleep      Patients Stated Pain Goal: 1 (10/23/20 0352)  Complications: No complications documented.

## 2020-10-23 NOTE — Anesthesia Procedure Notes (Signed)
Procedure Name: Intubation Date/Time: 10/23/2020 4:38 PM Performed by: Moshe Salisbury, CRNA Pre-anesthesia Checklist: Patient identified, Emergency Drugs available, Suction available and Patient being monitored Patient Re-evaluated:Patient Re-evaluated prior to induction Oxygen Delivery Method: Circle System Utilized Preoxygenation: Pre-oxygenation with 100% oxygen Induction Type: IV induction Ventilation: Mask ventilation without difficulty Laryngoscope Size: Mac and 3 Grade View: Grade I Tube type: Oral Tube size: 7.5 mm Number of attempts: 1 Airway Equipment and Method: Stylet Placement Confirmation: ETT inserted through vocal cords under direct vision,  positive ETCO2 and breath sounds checked- equal and bilateral Secured at: 21 cm Tube secured with: Tape Dental Injury: Teeth and Oropharynx as per pre-operative assessment

## 2020-10-23 NOTE — Consult Note (Signed)
[facility] Consultation  Reason for Consult: Bilateral elbow fractures Referring Physician: Annell Greening, MD; Shauna Hugh, MD  Kayla Carrillo is an 84 y.o. female.  HPI: Patient fell while going to get some water, acute elbow pain bilaterally, occasionally uses walker but can go without. Widowed, supportive daughters, one of whom is at bedside. Denies numbness and tingling in her digits. Recent heart echo shows slight improvement.   Past Medical History:  Diagnosis Date  . Cardiomyopathy (HCC)    Takotsubo  . Depression with anxiety   . Dyslipidemia   . GERD (gastroesophageal reflux disease)     Past Surgical History:  Procedure Laterality Date  . CHOLECYSTECTOMY    . LEG SURGERY     Metal rod left leg  . TOTAL KNEE ARTHROPLASTY     Bilateral    Family History  Family history unknown: Yes    Social History:  reports that she has never smoked. She has never used smokeless tobacco. No history on file for alcohol use and drug use.  Allergies:  Allergies  Allergen Reactions  . Codeine Sulfate [Codeine] Other (See Comments)    drowsey  . Sulfa Antibiotics Other (See Comments)    drowsey    Medications:  Prior to Admission:  Medications Prior to Admission  Medication Sig Dispense Refill Last Dose  . carvedilol (COREG) 6.25 MG tablet Take 6.25 mg by mouth 2 (two) times daily with a meal.   10/21/2020 at 730am  . nitrofurantoin (MACRODANTIN) 100 MG capsule Take 100 mg by mouth at bedtime.   10/20/2020  . omeprazole (PRILOSEC) 20 MG capsule Take 20 mg by mouth daily.   10/21/2020 at Unknown time  . oxybutynin (DITROPAN-XL) 10 MG 24 hr tablet Take 10 mg by mouth daily.   10/21/2020 at Unknown time  . sertraline (ZOLOFT) 50 MG tablet Take 50 mg by mouth daily.   10/21/2020 at Unknown time  . simvastatin (ZOCOR) 40 MG tablet Take 40 mg by mouth daily.   10/21/2020 at Unknown time    Results for orders placed or performed during the hospital encounter of  10/21/20 (from the past 48 hour(s))  CBC     Status: Abnormal   Collection Time: 10/21/20  6:50 PM  Result Value Ref Range   WBC 12.6 (H) 4.0 - 10.5 K/uL   RBC 4.60 3.87 - 5.11 MIL/uL   Hemoglobin 13.1 12.0 - 15.0 g/dL   HCT 13.0 36 - 46 %   MCV 88.0 80.0 - 100.0 fL   MCH 28.5 26.0 - 34.0 pg   MCHC 32.3 30.0 - 36.0 g/dL   RDW 86.5 78.4 - 69.6 %   Platelets 202 150 - 400 K/uL   nRBC 0.0 0.0 - 0.2 %    Comment: Performed at [facility], [address] [place], [place] [address]  Basic metabolic panel     Status: Abnormal   Collection Time: 10/21/20  6:50 PM  Result Value Ref Range   Sodium 140 135 - 145 mmol/L   Potassium 4.2 3.5 - 5.1 mmol/L   Chloride 106 98 - 111 mmol/L   CO2 24 22 - 32 mmol/L   Glucose, Bld 109 (H) 70 - 99 mg/dL    Comment: Glucose reference range applies only to samples taken after fasting for at least 8 hours.   BUN 18 8 - 23 mg/dL   Creatinine, Ser 4.13 0.44 - 1.00 mg/dL   Calcium 8.7 (L) 8.9 - 10.3 mg/dL  GFR, Estimated >60 >60 mL/min    Comment: (NOTE) Calculated using the CKD-EPI Creatinine Equation (2021)    Anion gap 10 5 - 15    Comment: Performed at [facility], [address] [place], [place] [address]  Respiratory Panel by RT PCR (Flu A&B, Covid) - Nasopharyngeal Swab     Status: None   Collection Time: 10/21/20  6:50 PM   Specimen: Nasopharyngeal Swab  Result Value Ref Range   SARS Coronavirus 2 by RT PCR NEGATIVE NEGATIVE    Comment: (NOTE) SARS-CoV-2 target nucleic acids are NOT DETECTED.  The SARS-CoV-2 RNA is generally detectable in upper respiratoy specimens during the acute phase of infection. The lowest concentration of SARS-CoV-2 viral copies this assay can detect is 131 copies/mL. A negative result does not preclude SARS-Cov-2 infection and should not be used as the sole basis for treatment or other patient management decisions. A negative result may occur with  improper specimen collection/handling,  submission of specimen other than nasopharyngeal swab, presence of viral mutation(s) within the areas targeted by this assay, and inadequate number of viral copies (<131 copies/mL). A negative result must be combined with clinical observations, patient history, and epidemiological information. The expected result is Negative.  Fact Sheet for Patients:  https://www.moore.com/  Fact Sheet for Healthcare Providers:  https://www.young.biz/  This test is no t yet approved or cleared by the [place] FDA and  has been authorized for detection and/or diagnosis of SARS-CoV-2 by FDA under an Emergency Use Authorization (EUA). This EUA will remain  in effect (meaning this test can be used) for the duration of the COVID-19 declaration under Section 564(b)(1) of the Act, 21 U.S.C. section 360bbb-3(b)(1), unless the authorization is terminated or revoked sooner.     Influenza A by PCR NEGATIVE NEGATIVE   Influenza B by PCR NEGATIVE NEGATIVE    Comment: (NOTE) The Xpert Xpress SARS-CoV-2/FLU/RSV assay is intended as an aid in  the diagnosis of influenza from Nasopharyngeal swab specimens and  should not be used as a sole basis for treatment. Nasal washings and  aspirates are unacceptable for Xpert Xpress SARS-CoV-2/FLU/RSV  testing.  Fact Sheet for Patients: https://www.moore.com/  Fact Sheet for Healthcare Providers: https://www.young.biz/  This test is not yet approved or cleared by the [place] FDA and  has been authorized for detection and/or diagnosis of SARS-CoV-2 by  FDA under an Emergency Use Authorization (EUA). This EUA will remain  in effect (meaning this test can be used) for the duration of the  Covid-19 declaration under Section 564(b)(1) of the Act, 21  U.S.C. section 360bbb-3(b)(1), unless the authorization is  terminated or revoked. Performed at [facility], [address] [place], [place] [address]   Urinalysis, Routine w reflex microscopic     Status: None   Collection Time: 10/21/20 11:48 PM  Result Value Ref Range   Color, Urine YELLOW YELLOW   APPearance CLEAR CLEAR   Specific Gravity, Urine 1.014 1.005 - 1.030   pH 7.0 5.0 - 8.0   Glucose, UA NEGATIVE NEGATIVE mg/dL   Hgb urine dipstick NEGATIVE NEGATIVE   Bilirubin Urine NEGATIVE NEGATIVE   Ketones, ur NEGATIVE NEGATIVE mg/dL   Protein, ur NEGATIVE NEGATIVE mg/dL   Nitrite NEGATIVE NEGATIVE   Leukocytes,Ua NEGATIVE NEGATIVE    Comment: Performed at [facility], [address], [place], [place] [address]  Surgical pcr screen     Status: None   Collection Time: 10/22/20 12:04 AM  Specimen: Nasal Mucosa; Nasal Swab  Result Value Ref Range   MRSA, PCR NEGATIVE NEGATIVE   Staphylococcus aureus NEGATIVE NEGATIVE    Comment: (NOTE) The Xpert SA Assay (FDA approved for NASAL specimens in patients 35 years of age and older), is one component of a comprehensive surveillance program. It is not intended to diagnose infection nor to guide or monitor treatment. Performed at [facility], [address], [place], [place] [address]   CK     Status: None   Collection Time: 10/22/20  2:09 AM  Result Value Ref Range   Total CK 38 38.0 - 234.0 U/L    Comment: Performed at [facility], [address], [place], [place] [address]  VITAMIN D 25 Hydroxy (Vit-D Deficiency, Fractures)     Status: Abnormal   Collection Time: 10/22/20  2:09 AM  Result Value Ref Range   Vit D, 25-Hydroxy 22.56 (L) 30 - 100 ng/mL    Comment: (NOTE) Vitamin D deficiency has been defined by the Institute of Medicine  and an Endocrine Society practice guideline as a level of serum 25-OH  vitamin D less than 20 ng/mL (1,2). The Endocrine Society went on to  further define vitamin D insufficiency as a level between 21 and 29  ng/mL (2).  1. IOM (Institute of Medicine). 2010. Dietary reference intakes for   calcium and D. [place]: [employer]. 2. Holick MF, Binkley [place], Bischoff-Ferrari HA, et al. Evaluation,  treatment, and prevention of vitamin D deficiency: an Endocrine  Society clinical practice guideline, JCEM. 2011 Jul; 96(7): 1911-30.  Performed at [facility], [address] [place], [place] [address]   TSH     Status: None   Collection Time: 10/22/20  2:09 AM  Result Value Ref Range   TSH 2.560 0.350 - 4.500 uIU/mL    Comment: Performed by a 3rd Generation assay with a functional sensitivity of <=0.01 uIU/mL. Performed at [facility], [address], [place], [place] [address]   CBC WITH DIFFERENTIAL     Status: Abnormal   Collection Time: 10/22/20  2:09 AM  Result Value Ref Range   WBC 11.6 (H) 4.0 - 10.5 K/uL   RBC 4.05 3.87 - 5.11 MIL/uL   Hemoglobin 11.4 (L) 12.0 - 15.0 g/dL   HCT 41.3 (L) 36 - 46 %   MCV 88.4 80.0 - 100.0 fL   MCH 28.1 26.0 - 34.0 pg   MCHC 31.8 30.0 - 36.0 g/dL   RDW 24.4 01.0 - 27.2 %   Platelets 198 150 - 400 K/uL   nRBC 0.0 0.0 - 0.2 %   Neutrophils Relative % 63 %   Neutro Abs 7.4 1.7 - 7.7 K/uL   Lymphocytes Relative 24 %   Lymphs Abs 2.8 0.7 - 4.0 K/uL   Monocytes Relative 9 %   Monocytes Absolute 1.0 0.1 - 1.0 K/uL   Eosinophils Relative 3 %   Eosinophils Absolute 0.3 0.0 - 0.5 K/uL   Basophils Relative 1 %   Basophils Absolute 0.1 0.0 - 0.1 K/uL   Immature Granulocytes 0 %   Abs Immature Granulocytes 0.04 0.00 - 0.07 K/uL    Comment: Performed at [facility], [address], [place], [place] [address]  Magnesium     Status: None   Collection Time: 10/22/20  2:09 AM  Result Value Ref Range   Magnesium 1.9 1.7 - 2.4 mg/dL    Comment: Performed at  [facility], [address] [place] [place], [place] [address]  Comprehensive metabolic panel     Status: Abnormal   Collection Time: 10/22/20  2:09 AM  Result Value Ref Range   Sodium 139 135 - 145 mmol/L   Potassium 3.7 3.5 - 5.1 mmol/L    Chloride 104 98 - 111 mmol/L   CO2 25 22 - 32 mmol/L   Glucose, Bld 110 (H) 70 - 99 mg/dL    Comment: Glucose reference range applies only to samples taken after fasting for at least 8 hours.   BUN 13 8 - 23 mg/dL   Creatinine, Ser 6.04 0.44 - 1.00 mg/dL   Calcium 8.5 (L) 8.9 - 10.3 mg/dL   Total Protein 6.0 (L) 6.5 - 8.1 g/dL   Albumin 3.1 (L) 3.5 - 5.0 g/dL   AST 14 (L) 15 - 41 U/L   ALT 11 0 - 44 U/L   Alkaline Phosphatase 68 38 - 126 U/L   Total Bilirubin 0.8 0.3 - 1.2 mg/dL   GFR, Estimated >54 >09 mL/min    Comment: (NOTE) Calculated using the CKD-EPI Creatinine Equation (2021)    Anion gap 10 5 - 15    Comment: Performed at [facility], [address], [place], [place] [address]  APTT     Status: None   Collection Time: 10/22/20  2:09 AM  Result Value Ref Range   aPTT 30 24 - 36 seconds    Comment: Performed at [facility], [address], [place], [place] [address]  Protime-INR     Status: None   Collection Time: 10/22/20  2:09 AM  Result Value Ref Range   Prothrombin Time 14.6 11.4 - 15.2 seconds   INR 1.2 0.8 - 1.2    Comment: (NOTE) INR goal varies based on device and disease states. Performed at [facility], [address], [place], [place] [address]   Type and screen     Status: None   Collection Time: 10/23/20 10:33 AM  Result Value Ref Range   ABO/RH(D) O POS    Antibody Screen NEG    Sample Expiration      10/26/2020,2359 Performed at [facility], [address] [place], [place] [address]   ABO/Rh     Status: None   Collection Time: 10/23/20  2:00 PM  Result Value Ref Range   ABO/RH(D)      O POS Performed at [facility], [address] [place], [place] [address]     DG Elbow Complete Left  Result Date: 10/21/2020 CLINICAL DATA:  Left elbow pain after fall EXAM: LEFT ELBOW - COMPLETE 3+ VIEW COMPARISON:  None. FINDINGS: Acute fracture of the olecranon process of the proximal ulna with 4 mm of fracture  distraction. Fracture extends intra-articularly to the ulnotrochlear joint. There is a small elbow joint hemarthrosis. No additional fractures are identified. There is prominence of the radial tuberosity, nonspecific. Degenerative changes of the elbow joint. Enthesopathic changes at the medial and lateral humeral epicondyles. Prominent soft tissue swelling/hematoma overlying the fracture site at the posterior elbow. IMPRESSION: Left elbow: 1. Acute fracture of the olecranon process of the proximal ulna with 4 mm of fracture distraction. 2. Prominent soft tissue swelling/hematoma overlying the fracture site at the fracture site. Electronically Signed   By: Duanne Guess D.O.   On: 10/21/2020 17:31   DG Elbow Complete Right  Result Date: 10/21/2020 CLINICAL DATA:  Fall, right elbow pain  EXAM: RIGHT ELBOW - COMPLETE 3+ VIEW COMPARISON:  None. FINDINGS: Acute transcondylar fracture of the distal right humerus with intra-articular extension. Slight anterior displacement and angulation at the fracture site. No fractures identified involving the proximal radius or ulna. There is a large elbow joint hemarthrosis. Diffuse soft tissue swelling. IMPRESSION: Right elbow: Acute transcondylar fracture of the distal right humerus with intra-articular extension and slight anterior displacement and angulation. Electronically Signed   By: Duanne GuessNicholas  Plundo D.O.   On: 10/21/2020 17:35   CT Head Wo Contrast  Result Date: 10/21/2020 CLINICAL DATA:  84 year old female with fall and head injury today. EXAM: CT HEAD WITHOUT CONTRAST TECHNIQUE: Contiguous axial images were obtained from the base of the skull through the vertex without intravenous contrast. COMPARISON:  None. FINDINGS: Brain: No evidence of acute infarction, hemorrhage, hydrocephalus, extra-axial collection or mass lesion/mass effect. Moderate periventricular white matter hypodensities are of uncertain chronicity but most likely represent chronic small-vessel  white matter ischemic changes. Mild generalized cerebral atrophy is noted. Vascular: Carotid atherosclerotic calcifications are noted. Skull: Normal. Negative for fracture or focal lesion. Sinuses/Orbits: No acute finding. Other: None. IMPRESSION: 1. No evidence of acute intracranial abnormality. 2. Moderate periventricular white matter hypodensities - likely chronic small-vessel white matter ischemic changes. 3. Mild generalized cerebral atrophy. Electronically Signed   By: Harmon PierJeffrey  Hu M.D.   On: 10/21/2020 17:31   ECHOCARDIOGRAM COMPLETE  Result Date: 10/22/2020    ECHOCARDIOGRAM REPORT   Patient Name:   Kayla RamaBARBARA Coppinger Date of Exam: 10/22/2020 Medical Rec #:  [id number]          Height:       63.0 in Accession #:    [id number]         Weight:       181.0 lb Date of Birth:  09-30-1936          BSA:          1.853 m Patient Age:    84 years           BP:           146/65 mmHg Patient Gender: F                  HR:           75 bpm. Exam Location:  Inpatient Procedure: 2D Echo, Color Doppler and Cardiac Doppler Indications:    Pre-op Evaluation  History:        Patient has prior history of Echocardiogram examinations, most                 recent 06/17/2019. CHF; Risk Factors:Hypertension and                 Dyslipidemia.  Sonographer:    Irving BurtonEmily Senior RDCS Referring Phys: [id number] Ellsworth LennoxBRITTANY M STRADER IMPRESSIONS  1. Left ventricular ejection fraction, by estimation, is 50 to 55%. The left ventricle has low normal function. The left ventricle has no regional wall motion abnormalities. There is mild concentric left ventricular hypertrophy. Left ventricular diastolic parameters are consistent with Grade I diastolic dysfunction (impaired relaxation).  2. Right ventricular systolic function is normal. The right ventricular size is normal. Tricuspid regurgitation signal is inadequate for assessing PA pressure.  3. The mitral valve is degenerative. No evidence of mitral valve regurgitation. No evidence of mitral  stenosis.  4. The aortic valve is grossly normal. Aortic valve regurgitation is not visualized. No aortic stenosis is present.  5. The inferior vena cava is normal  in size with greater than 50% respiratory variability, suggesting right atrial pressure of 3 mmHg. Comparison(s): Prior images reviewed side by side. Changes from prior study are noted. The left ventricular function has improved. FINDINGS  Left Ventricle: Left ventricular ejection fraction, by estimation, is 50 to 55%. The left ventricle has low normal function. The left ventricle has no regional wall motion abnormalities. The left ventricular internal cavity size was normal in size. There is mild concentric left ventricular hypertrophy. Left ventricular diastolic parameters are consistent with Grade I diastolic dysfunction (impaired relaxation). Right Ventricle: The right ventricular size is normal. No increase in right ventricular wall thickness. Right ventricular systolic function is normal. Tricuspid regurgitation signal is inadequate for assessing PA pressure. Left Atrium: Left atrial size was normal in size. Right Atrium: Right atrial size was normal in size. Pericardium: Trivial pericardial effusion is present. Presence of pericardial fat pad. Mitral Valve: The mitral valve is degenerative in appearance. Mild mitral annular calcification. No evidence of mitral valve regurgitation. No evidence of mitral valve stenosis. Tricuspid Valve: The tricuspid valve is grossly normal. Tricuspid valve regurgitation is trivial. No evidence of tricuspid stenosis. Aortic Valve: The aortic valve is grossly normal. Aortic valve regurgitation is not visualized. No aortic stenosis is present. Pulmonic Valve: The pulmonic valve was grossly normal. Pulmonic valve regurgitation is not visualized. No evidence of pulmonic stenosis. Aorta: The aortic root is normal in size and structure. Venous: The inferior vena cava is normal in size with greater than 50% respiratory  variability, suggesting right atrial pressure of 3 mmHg. IAS/Shunts: The atrial septum is grossly normal.  LEFT VENTRICLE PLAX 2D LVIDd:         4.30 cm  Diastology LVIDs:         3.20 cm  LV e' medial:    4.68 cm/s LV PW:         1.30 cm  LV E/e' medial:  16.7 LV IVS:        1.30 cm  LV e' lateral:   5.33 cm/s LVOT diam:     1.90 cm  LV E/e' lateral: 14.6 LV SV:         54 LV SV Index:   29 LVOT Area:     2.84 cm  RIGHT VENTRICLE RV S prime:     11.40 cm/s TAPSE (M-mode): 2.2 cm LEFT ATRIUM             Index       RIGHT ATRIUM           Index LA diam:        3.60 cm 1.94 cm/m  RA Area:     16.80 cm LA Vol (A2C):   46.9 ml 25.31 ml/m RA Volume:   41.20 ml  22.23 ml/m LA Vol (A4C):   65.3 ml 35.23 ml/m LA Biplane Vol: 58.2 ml 31.40 ml/m  AORTIC VALVE LVOT Vmax:   86.20 cm/s LVOT Vmean:  62.000 cm/s LVOT VTI:    0.189 m  AORTA Ao Root diam: 3.00 cm MITRAL VALVE MV Area (PHT): 3.21 cm     SHUNTS MV Decel Time: 236 msec     Systemic VTI:  0.19 m MV E velocity: 78.00 cm/s   Systemic Diam: 1.90 cm MV A velocity: 102.00 cm/s MV E/A ratio:  0.76 Lennie Odor MD Electronically signed by Lennie Odor MD Signature Date/Time: 10/22/2020/1:57:05 PM    Final     ROS Reports occasional unsteadiness of gait. Blood pressure (!) 153/82, pulse  80, temperature 98 F (36.7 C), temperature source Oral, resp. rate 18, height  (1.6 m), weight 82.1 kg, SpO2 96 %. Physical Exam NCAT, very pleasant LUEx   Splint in place  Sens  Ax/R/M/U intact  Mot   Ax/ R/ PIN/ M/ AIN/ U intact  Brisk CR RUEx   Splint in place  Sens  Ax/R/M/U intact  Mot   Ax/ R/ PIN/ M/ AIN/ U intact  Brisk CR Pelvis--no traumatic wounds or rash, no ecchymosis, stable to manual stress, nontender LLE Nontender  No knee or ankle effusion  Knee stable to varus/ valgus and anterior/posterior stress  Sens DPN, SPN, TN intact  Motor EHL, ext, flex, evers 5/5  DP 1+, PT 2+, No significant edema RLE No traumatic wounds  Nontender  No knee or  ankle effusion  Sens DPN, SPN, TN intact  Motor EHL, ext, flex, evers 5/5  DP palp, PT palp, No significant edema   Assessment/Plan: Right supracondylar humerus fracture, left olecranon fracture  I discussed with the patient and her daughter the risks and benefits of surgery, including the possibility of infection, nerve injury, vessel injury, wound breakdown, arthritis, symptomatic hardware, DVT/ PE, loss of motion, malunion, nonunion, and need for further surgery among others.  We also specifically discussed the elevated risk of complications given combined elbow injuries. Her daughter acknowledged these risks and wished to proceed.  Myrene Galas, MD [facility], [facility] [phone]  10/23/2020  4:16 PM

## 2020-10-24 LAB — COMPREHENSIVE METABOLIC PANEL
ALT: 12 U/L (ref 0–44)
AST: 17 U/L (ref 15–41)
Albumin: 3.1 g/dL — ABNORMAL LOW (ref 3.5–5.0)
Alkaline Phosphatase: 81 U/L (ref 38–126)
Anion gap: 8 (ref 5–15)
BUN: 10 mg/dL (ref 8–23)
CO2: 26 mmol/L (ref 22–32)
Calcium: 8.7 mg/dL — ABNORMAL LOW (ref 8.9–10.3)
Chloride: 103 mmol/L (ref 98–111)
Creatinine, Ser: 0.63 mg/dL (ref 0.44–1.00)
GFR, Estimated: >60 mL/min (ref >60)
Glucose, Bld: 155 mg/dL — ABNORMAL HIGH (ref 70–99)
Potassium: 4 mmol/L (ref 3.5–5.1)
Sodium: 137 mmol/L (ref 135–145)
Total Bilirubin: 0.7 mg/dL (ref 0.3–1.2)
Total Protein: 6.7 g/dL (ref 6.5–8.1)

## 2020-10-24 LAB — CBC WITH DIFFERENTIAL/PLATELET
Abs Immature Granulocytes: 0.08 10*3/uL — ABNORMAL HIGH (ref 0.00–0.07)
Basophils Absolute: 0 10*3/uL (ref 0.0–0.1)
Basophils Relative: 0 %
Eosinophils Absolute: 0 10*3/uL (ref 0.0–0.5)
Eosinophils Relative: 0 %
HCT: 40.1 % (ref 36.0–46.0)
Hemoglobin: 13.1 g/dL (ref 12.0–15.0)
Immature Granulocytes: 1 %
Lymphocytes Relative: 9 %
Lymphs Abs: 1.2 10*3/uL (ref 0.7–4.0)
MCH: 28.7 pg (ref 26.0–34.0)
MCHC: 32.7 g/dL (ref 30.0–36.0)
MCV: 87.7 fL (ref 80.0–100.0)
Monocytes Absolute: 0.8 10*3/uL (ref 0.1–1.0)
Monocytes Relative: 7 %
Neutro Abs: 10.5 10*3/uL — ABNORMAL HIGH (ref 1.7–7.7)
Neutrophils Relative %: 83 %
Platelets: 233 10*3/uL (ref 150–400)
RBC: 4.57 MIL/uL (ref 3.87–5.11)
RDW: 13.5 % (ref 11.5–15.5)
WBC: 12.6 10*3/uL — ABNORMAL HIGH (ref 4.0–10.5)
nRBC: 0 % (ref 0.0–0.2)

## 2020-10-24 LAB — BRAIN NATRIURETIC PEPTIDE: B Natriuretic Peptide: 330.1 pg/mL — ABNORMAL HIGH (ref 0.0–100.0)

## 2020-10-24 LAB — MAGNESIUM: Magnesium: 2 mg/dL (ref 1.7–2.4)

## 2020-10-24 MED ORDER — POLYETHYLENE GLYCOL 3350 17 G PO PACK
17.0000 g | PACK | Freq: Every day | ORAL | Status: DC
  Administered 2020-10-24 – 2020-10-26 (×3): 17 g via ORAL
  Filled 2020-10-24 (×3): qty 1

## 2020-10-24 MED ORDER — HYDRALAZINE HCL 50 MG PO TABS
50.0000 mg | ORAL_TABLET | Freq: Three times a day (TID) | ORAL | Status: DC
  Administered 2020-10-24 – 2020-10-26 (×6): 50 mg via ORAL
  Filled 2020-10-24 (×7): qty 1

## 2020-10-24 MED ORDER — FUROSEMIDE 40 MG PO TABS
40.0000 mg | ORAL_TABLET | Freq: Once | ORAL | Status: AC
  Administered 2020-10-24: 40 mg via ORAL
  Filled 2020-10-24: qty 1

## 2020-10-24 MED ORDER — MORPHINE SULFATE (PF) 2 MG/ML IV SOLN
1.0000 mg | INTRAVENOUS | Status: DC | PRN
  Filled 2020-10-24: qty 1

## 2020-10-24 MED ORDER — BISACODYL 5 MG PO TBEC
5.0000 mg | DELAYED_RELEASE_TABLET | Freq: Every day | ORAL | Status: AC
  Administered 2020-10-24 – 2020-10-26 (×3): 5 mg via ORAL
  Filled 2020-10-24 (×3): qty 1

## 2020-10-24 MED ORDER — HYDROCODONE-ACETAMINOPHEN 5-325 MG PO TABS
1.0000 | ORAL_TABLET | Freq: Four times a day (QID) | ORAL | Status: DC | PRN
  Administered 2020-10-25: 1 via ORAL
  Filled 2020-10-24: qty 1

## 2020-10-24 NOTE — Evaluation (Signed)
Physical Therapy Evaluation Patient Details Name: Kayla Carrillo MRN: [id number] DOB: 07-06-1936 Today's Date: 10/24/2020   History of Present Illness  Patient is a 84 y/o female who presents with Bil UE fxs s/p fall and now s/p ORIF right humerus and ORIF left olecranon fx 10/23/20. PMH includes cardiomyopathy, depression witih anxiety, dyslipidemia.  Clinical Impression  Patient presents with generalized weakness, pain, post surgical deficits BUEs, edema bil hands, impaired cognition, impaired balance and impaired mobility s/p above. PTA, pt lived in an ILF, performing own ADLs and getting assist with IADLs from family. Pt reports still driving and 3-4 falls in last 6 months. Has a lift alert but rarely wears it. Today, pt requires Mod A for bed mobility and min A of 2 for standing and SPT to chair. Elevated UEs on pillows/blankets and instructed pt in AROM of hands and scapular retraction exercises. Pt is a high fall risk at this time. Would benefit from SNF to maximize independence and mobility prior to return home. Will follow acutely.    Follow Up Recommendations SNF;Supervision for mobility/OOB    Equipment Recommendations  None recommended by PT    Recommendations for Other Services       Precautions / Restrictions Precautions Precautions: Fall;Other (comment) Precaution Comments: watch 02 Required Braces or Orthoses: Splint/Cast Splint/Cast: BUEs Restrictions Weight Bearing Restrictions: Yes RUE Weight Bearing: Non weight bearing LUE Weight Bearing: Non weight bearing      Mobility  Bed Mobility Overal bed mobility: Needs Assistance Bed Mobility: Supine to Sit     Supine to sit: Mod assist;HOB elevated     General bed mobility comments: Able to bring LEs to EOB and scoot bottom, assist to elevate trunk and scootign rest of way, posterior bias.    Transfers Overall transfer level: Needs assistance Equipment used: 2 person hand held assist Transfers: Sit  to/from [employer] Sit to Stand: Min assist;+2 physical assistance Stand pivot transfers: Min assist;+2 physical assistance       General transfer comment: Assist of 2 to stand from EOB x2 with cues for forward translation and upright. Bil knee instability. SPT bed to chair with Min A of 2, assist with weight shifting to shuffle feet towards chair.  Ambulation/Gait             General Gait Details: Deferred  Stairs            Wheelchair Mobility    Modified Rankin (Stroke Patients Only)       Balance Overall balance assessment: Needs assistance Sitting-balance support: Feet supported;No upper extremity supported Sitting balance-Leahy Scale: Fair Sitting balance - Comments: Initially Min A due to posterior lean progressing to min guard. Postural control: Posterior lean Standing balance support: During functional activity Standing balance-Leahy Scale: Poor Standing balance comment: Requires external support.                             Pertinent Vitals/Pain Pain Assessment: Faces Faces Pain Scale: Hurts little more Pain Location: BUEs Pain Descriptors / Indicators: Operative site guarding;Aching Pain Intervention(s): Monitored during session;Repositioned    Home Living Family/patient expects to be discharged to:: Skilled nursing facility     Type of Home: Apartment Home Access: Elevator     Home Layout: One level Home Equipment: Environmental consultant - 2 wheels;Cane - single point      Prior Function Level of Independence: Independent         Comments: Lots of falls, has  life alert. Does own ADLs. Still drives but should not be per family.     Hand Dominance        Extremity/Trunk Assessment   Upper Extremity Assessment Upper Extremity Assessment: Defer to OT evaluation (Swelling and ecchymoses present bil hands, lacking full ROM at digits.)    Lower Extremity Assessment Lower Extremity Assessment: Generalized weakness (Able  to perform SLR bilaterally.)    Cervical / Trunk Assessment Cervical / Trunk Assessment: Kyphotic  Communication      Cognition Arousal/Alertness: Awake/alert;Lethargic Behavior During Therapy: WFL for tasks assessed/performed Overall Cognitive Status: Impaired/Different from baseline Area of Impairment: Memory;Following commands                     Memory: Decreased short-term memory;Decreased recall of precautions Following Commands: Follows one step commands with increased time (repetition)       General Comments: Pt requires max repetition to answer questions; tangential-speaking unrelated to topics. Needs cues to stay attended to task and be redirected back to question or task at hand. Pt masks not knowing answers to questions or info with humor. Family in room speaking for patient at times.      General Comments General comments (skin integrity, edema, etc.): Family present during session. Sp02 89% on RA post session, donned 02 post session.    Exercises     Assessment/Plan    PT Assessment Patient needs continued PT services  PT Problem List Decreased strength;Decreased mobility;Pain;Decreased balance;Decreased skin integrity;Cardiopulmonary status limiting activity;Decreased knowledge of precautions;Decreased range of motion;Decreased activity tolerance;Decreased cognition       PT Treatment Interventions Therapeutic activities;Gait training;Therapeutic exercise;Patient/family education;Balance training;Functional mobility training;DME instruction    PT Goals (Current goals can be found in the Care Plan section)  Acute Rehab PT Goals Patient Stated Goal: get therapy to get stronger PT Goal Formulation: With patient/family Time For Goal Achievement: 11/07/20 Potential to Achieve Goals: Good    Frequency Min 2X/week   Barriers to discharge Decreased caregiver support lives alone    Co-evaluation               AM-PAC PT "6 Clicks" Mobility   Outcome Measure Help needed turning from your back to your side while in a flat bed without using bedrails?: A Little Help needed moving from lying on your back to sitting on the side of a flat bed without using bedrails?: A Lot Help needed moving to and from a bed to a chair (including a wheelchair)?: A Little Help needed standing up from a chair using your arms (e.g., wheelchair or bedside chair)?: A Little Help needed to walk in hospital room?: A Lot Help needed climbing 3-5 steps with a railing? : A Lot 6 Click Score: 15    End of Session Equipment Utilized During Treatment: Oxygen;Gait belt Activity Tolerance: Patient tolerated treatment well Patient left: in chair;with call bell/phone within reach;with chair alarm set;with family/visitor present Nurse Communication: Mobility status PT Visit Diagnosis: Pain;Muscle weakness (generalized) (M62.81);Unsteadiness on feet (R26.81);Difficulty in walking, not elsewhere classified (R26.2) Pain - Right/Left:  (bilateral) Pain - part of body:  (arms)    Time: 3846-6599 PT Time Calculation (min) (ACUTE ONLY): 33 min   Charges:   PT Evaluation $PT Eval Moderate Complexity: 1 Mod          Vale Haven, PT, DPT Acute Rehabilitation Services Pager [phone] Office [phone]      Kayla Carrillo 10/24/2020, 1:05 PM

## 2020-10-24 NOTE — Evaluation (Signed)
Occupational Therapy Evaluation Patient Details Name: Kayla Carrillo MRN: [id number] DOB: 07-18-1936 Today's Date: 10/24/2020    History of Present Illness Patient is a 84 y/o female who presents with Bil UE fxs s/p fall and now s/p ORIF right humerus and ORIF left olecranon fx 10/23/20. PMH includes cardiomyopathy, depression witih anxiety, dyslipidemia.   Clinical Impression   Patient is s/p BIL ORIF olecranon fx surgery resulting in functional limitations due to the deficits listed below (see OT problem list). Pt currently requires total (A) for all adls and total +2 (A) for basic transfers. Pt will need increased level of care for d/c planning from her indep living apartment. Pt with cognitive deficits at this time and tangential when asked direct questions Patient will benefit from skilled OT acutely to increase independence and safety with ADLS to allow discharge SNF.     Follow Up Recommendations  SNF;Supervision - Intermittent    Equipment Recommendations  3 in 1 bedside commode    Recommendations for Other Services       Precautions / Restrictions Precautions Precautions: Fall;Other (comment) Precaution Comments: watch 02 Required Braces or Orthoses: Splint/Cast Splint/Cast: BUEs-- noted to have mold skin applied to edge of cast but concern for swelling at this time Restrictions Weight Bearing Restrictions: Yes RUE Weight Bearing: Non weight bearing LUE Weight Bearing: Non weight bearing      Mobility Bed Mobility Overal bed mobility: Needs Assistance Bed Mobility: Supine to Sit     Supine to sit: Mod assist;HOB elevated     General bed mobility comments: Able to bring LEs to EOB and scoot bottom, assist to elevate trunk and scootign rest of way, posterior bias.    Transfers Overall transfer level: Needs assistance Equipment used: 2 person hand held assist Transfers: Sit to/from [employer] Sit to Stand: Min assist;+2 physical  assistance Stand pivot transfers: Min assist;+2 physical assistance       General transfer comment: Assist of 2 to stand from EOB x2 with cues for forward translation and upright. Bil knee instability. SPT bed to chair with Min A of 2, assist with weight shifting to shuffle feet towards chair.    Balance Overall balance assessment: Needs assistance Sitting-balance support: Feet supported;No upper extremity supported Sitting balance-Leahy Scale: Fair Sitting balance - Comments: Initially Min A due to posterior lean progressing to min guard. Postural control: Posterior lean Standing balance support: During functional activity Standing balance-Leahy Scale: Poor Standing balance comment: Requires external support.                           ADL either performed or assessed with clinical judgement   ADL Overall ADL's : Needs assistance/impaired Eating/Feeding: Total assistance Eating/Feeding Details (indicate cue type and reason): daughter in law present to feed patient Grooming: Total assistance   Upper Body Bathing: Total assistance   Lower Body Bathing: Total assistance   Upper Body Dressing : Total assistance   Lower Body Dressing: Total assistance   Toilet Transfer: +2 for physical assistance;Moderate assistance             General ADL Comments: pt needs cues throughout session not to weight bear on BIL UE     Vision         Perception     Praxis      Pertinent Vitals/Pain Pain Assessment: Faces Faces Pain Scale: Hurts little more Pain Location: BUEs Pain Descriptors / Indicators: Operative site guarding;Aching Pain Intervention(s): Monitored during  session;Premedicated before session;Repositioned;Other (comment) (elevation of bil UE)     Hand Dominance Right   Extremity/Trunk Assessment Upper Extremity Assessment Upper Extremity Assessment: RUE deficits/detail;LUE deficits/detail RUE Deficits / Details: edema wrist and hands with tight fit to  the cast. concerns for pressure on skin  LUE Deficits / Details: edema wrist and hands with tight fit to the cast. concerns for pressure on skin    Lower Extremity Assessment Lower Extremity Assessment: Generalized weakness (Able to perform SLR bilaterally.)   Cervical / Trunk Assessment Cervical / Trunk Assessment: Kyphotic   Communication Communication Communication: No difficulties   Cognition Arousal/Alertness: Awake/alert Behavior During Therapy: WFL for tasks assessed/performed Overall Cognitive Status: Impaired/Different from baseline Area of Impairment: Memory;Following commands                     Memory: Decreased short-term memory;Decreased recall of precautions Following Commands: Follows one step commands with increased time       General Comments: Pt requires max repetition to answer questions; tangential-speaking unrelated to topics. Needs cues to stay attended to task and be redirected back to question or task at hand. Pt masks not knowing answers to questions or info with humor. Family in room speaking for patient at times.   General Comments  elevated bil UE on pillows/ blankets to decr hand edema requires [place] 2L due to SPo2 89% RA    Exercises Exercises: Hand exercises;Other exercises Other Exercises Other Exercises: digit arom Other Exercises: educated on edema management with poor carry over Other Exercises: pt could benefit from [facility] splint to rotate   Shoulder Instructions      Home Living Family/patient expects to be discharged to:: Skilled nursing facility     Type of Home: Apartment Home Access: Elevator     Home Layout: One level               Home Equipment: Environmental consultant - 2 wheels;Cane - single point   Additional Comments: ex daughter in law present that is (A)ing patient reports need for increased care now after injury      Prior Functioning/Environment Level of Independence: Independent        Comments: Lots of falls, has  life alert. Does own ADLs. Still drives but should not be per family.        OT Problem List: Decreased strength;Decreased range of motion;Decreased activity tolerance;Impaired balance (sitting and/or standing);Decreased cognition;Decreased coordination;Decreased safety awareness;Decreased knowledge of use of DME or AE;Decreased knowledge of precautions;Cardiopulmonary status limiting activity;Obesity;Impaired UE functional use;Pain;Increased edema      OT Treatment/Interventions: Self-care/ADL training;Therapeutic exercise;Neuromuscular education;Energy conservation;DME and/or AE instruction;Manual therapy;Modalities;Splinting;Therapeutic activities;Cognitive remediation/compensation;Patient/family education;Balance training    OT Goals(Current goals can be found in the care plan section) Acute Rehab OT Goals Patient Stated Goal: get therapy to get stronger OT Goal Formulation: With patient/family Time For Goal Achievement: 11/07/20 Potential to Achieve Goals: Good  OT Frequency: Min 3X/week   Barriers to D/C: Decreased caregiver support  lives alone at indep living apartment and will not have any help but has a life alert button       Co-evaluation PT/OT/SLP Co-Evaluation/Treatment: Yes Reason for Co-Treatment: Necessary to address cognition/behavior during functional activity;To address functional/ADL transfers   OT goals addressed during session: ADL's and self-care;Proper use of [employer] equipment and DME      AM-PAC OT "6 Clicks" Daily Activity     Outcome Measure Help from another person eating meals?: Total Help from another person taking care of personal grooming?:  Total Help from another person toileting, which includes using toliet, bedpan, or urinal?: Total Help from another person bathing (including washing, rinsing, drying)?: Total Help from another person to put on and taking off regular upper body clothing?: Total Help from another person to put on and taking off  regular lower body clothing?: Total 6 Click Score: 6   End of Session Equipment Utilized During Treatment: Gait belt;Oxygen Nurse Communication: Mobility status;Precautions;Weight bearing status  Activity Tolerance: Patient tolerated treatment well Patient left: in chair;with call bell/phone within reach;with family/visitor present;with chair alarm set  OT Visit Diagnosis: Unsteadiness on feet (R26.81);Muscle weakness (generalized) (M62.81);Pain Pain - Right/Left: Left Pain - part of body: Arm                Time: 1132-1204 OT Time Calculation (min): 32 min Charges:  OT General Charges $OT Visit: 1 Visit OT Evaluation $OT Eval Moderate Complexity: 1 Mod   Brynn, OTR/L  Acute Rehabilitation Services Pager: [phone] Office: [phone] .   Mateo Flow 10/24/2020, 3:03 PM

## 2020-10-24 NOTE — Progress Notes (Signed)
Orthopaedic Trauma Service Progress Note  Patient ID: Kayla Carrillo MRN: [id number] DOB/AGE: 1936-06-21 84 y.o.  Subjective:  Doing ok  Denies pain in either elbow  Very confused but very pleasant and quite funny   No other complaints    ROS As above   Objective:   VITALS:   Vitals:   10/24/20 0600 10/24/20 0617 10/24/20 0748 10/24/20 0749  BP: (!) 196/97 (!) 173/78 (!) 150/71 (!) 150/71  Pulse: 98  85 86  Resp:   16 16  Temp: 98.2 F (36.8 C)  98.7 F (37.1 C) 98.7 F (37.1 C)  TempSrc: Oral  Oral Oral  SpO2: 97%  95% 100%  Weight:      Height:        Estimated body mass index is 32.06 kg/m as calculated from the following:   Height as of this encounter: 5\' 3"  (1.6 m).   Weight as of this encounter: 82.1 kg.   Intake/Output      10/25 0701 - 10/26 0700 10/26 0701 - 10/27 0700   P.O.     I.V. (mL/kg) 1610 (19.6)    IV Piggyback 200    Total Intake(mL/kg) 1810 (22)    Urine (mL/kg/hr) 1700 (0.9)    Blood 150    Total Output 1850    Net -40           LABS  Results for orders placed or performed during the hospital encounter of 10/21/20 (from the past 24 hour(s))  ABO/Rh     Status: None   Collection Time: 10/23/20  2:00 PM  Result Value Ref Range   ABO/RH(D)      O POS Performed at [facility], [address] [place], [place]   CBC with Differential/Platelet     Status: Abnormal   Collection Time: 10/24/20  4:57 AM  Result Value Ref Range   WBC 12.6 (H) 4.0 - 10.5 K/uL   RBC 4.57 3.87 - 5.11 MIL/uL   Hemoglobin 13.1 12.0 - 15.0 g/dL   HCT 10/26/20 36 - 46 %   MCV 87.7 80.0 - 100.0 fL   MCH 28.7 26.0 - 34.0 pg   MCHC 32.7 30.0 - 36.0 g/dL   RDW 11.5 72.6 - 20.3 %   Platelets 233 150 - 400 K/uL   nRBC 0.0 0.0 - 0.2 %   Neutrophils Relative % 83 %   Neutro Abs 10.5 (H) 1.7 - 7.7 K/uL   Lymphocytes Relative 9 %   Lymphs Abs 1.2 0.7 - 4.0 K/uL    Monocytes Relative 7 %   Monocytes Absolute 0.8 0.1 - 1.0 K/uL   Eosinophils Relative 0 %   Eosinophils Absolute 0.0 0.0 - 0.5 K/uL   Basophils Relative 0 %   Basophils Absolute 0.0 0.0 - 0.1 K/uL   Immature Granulocytes 1 %   Abs Immature Granulocytes 0.08 (H) 0.00 - 0.07 K/uL  Brain natriuretic peptide     Status: Abnormal   Collection Time: 10/24/20  4:57 AM  Result Value Ref Range   B Natriuretic Peptide 330.1 (H) 0.0 - 100.0 pg/mL  Magnesium     Status: None   Collection Time: 10/24/20  4:57 AM  Result Value Ref Range   Magnesium 2.0 1.7 - 2.4 mg/dL  Comprehensive metabolic panel  Status: Abnormal   Collection Time: 10/24/20  4:57 AM  Result Value Ref Range   Sodium 137 135 - 145 mmol/L   Potassium 4.0 3.5 - 5.1 mmol/L   Chloride 103 98 - 111 mmol/L   CO2 26 22 - 32 mmol/L   Glucose, Bld 155 (H) 70 - 99 mg/dL   BUN 10 8 - 23 mg/dL   Creatinine, Ser 3.88 0.44 - 1.00 mg/dL   Calcium 8.7 (L) 8.9 - 10.3 mg/dL   Total Protein 6.7 6.5 - 8.1 g/dL   Albumin 3.1 (L) 3.5 - 5.0 g/dL   AST 17 15 - 41 U/L   ALT 12 0 - 44 U/L   Alkaline Phosphatase 81 38 - 126 U/L   Total Bilirubin 0.7 0.3 - 1.2 mg/dL   GFR, Estimated >82 >80 mL/min   Anion gap 8 5 - 15     PHYSICAL EXAM:   Gen: sitting up in bed, NAD, appears well  Lungs: unlabored Cardiac: s1 and s2 Ext:       B upper extremities   LAS intact bilaterally   Moderate swelling to hands B   Ext are warm    Good perfusion distally   Radial, ulnar, median, AIN, PIN motor intact B   Radial, ulnar, median sensation intact B   No pain out of proportion with passive stretching of the digits   Assessment/Plan: 1 Day Post-Op   Principal Problem:   Fall at home, initial encounter Active Problems:   Closed olecranon fracture, left, initial encounter   Closed displaced transcondylar fracture of right humerus   Essential hypertension   Mixed hyperlipidemia   Leukocytosis   Chronic systolic heart failure  (HCC)   Anti-infectives (From admission, onward)   Start     Dose/Rate Route Frequency Ordered Stop   10/24/20 0200  ceFAZolin (ANCEF) IVPB 1 g/50 mL premix        1 g 100 mL/hr over 30 Minutes Intravenous Every 6 hours 10/23/20 2238 10/24/20 1959   10/23/20 1615  ceFAZolin (ANCEF) IVPB 2g/100 mL premix        2 g 200 mL/hr over 30 Minutes Intravenous  Once 10/23/20 1602 10/23/20 2019   10/23/20 1605  ceFAZolin (ANCEF) 2-4 GM/100ML-% IVPB       Note to Pharmacy: Shireen Quan   : cabinet override      10/23/20 1605 10/23/20 1714    .  POD/HD#: 1  84 y/o female s/p ground level fall with R distal humerus fracture and left olecranon fracture   -ground level fall  - R distal humerus fracture s/p ORIF and L olecranon fracture s/p ORIF   NWB B UEx   Splint B UEx for about 7-10 day then initiate ROM   Ok to move fingers and wrist   Will have some moleskin placed on L LAS at wrist area as some of the fiberglass is exposed   Ice and elevate for swelling and pain control    Elevate hand above elbow and elbow above heart    PT/OT   - Pain management:  Minimize narcotics   On scheduled tylenol   - Medical issues   Per primary   - DVT/PE prophylaxis:  Lovenox while inpatient   - ID:   periop abx  - Metabolic Bone Disease:  Vitamin d insufficiency    Supplement  - Activity:  Up with assistance only   - FEN/GI prophylaxis/Foley/Lines:  Bowl regimen   Perineal care   -Ex-fix/Splint care:  Keep splints clean and dry   - Impediments to fracture healing:  Vitamin d insufficiency   Low energy fracture   Ability to follow medical direction   - Dispo:  Ortho issues addressed and stable   PT/OT  SNF   Follow up with ortho in 10 days      Mearl Latin, PA-C [phone] (C) 10/24/2020, 10:47 AM  [facility] [address] [place] [address] [phone] Val [phone] (F)    After 5pm and on the weekends please log on to Amion,  go to orthopaedics and the look under the [facility] Call for the provider(s) on call. You can also call our office at [phone] and then follow the prompts to be connected to the call team.

## 2020-10-24 NOTE — [place] FL2 (Signed)
[place] MEDICAID FL2 LEVEL OF CARE SCREENING TOOL     IDENTIFICATION  Patient Name: Kayla Carrillo Birthdate: 1936-01-04 Sex: female Admission Date (Current Location): 10/21/2020  [facility] and [place] Number:  Producer, television/film/video and Address:  [facility] [place], [address] [place], [place] [address]      Provider Number: [id number]  Attending Physician Name and Address:  Leroy Sea, MD  Relative Name and Phone Number:       Current Level of Care: Hospital Recommended Level of Care: Skilled Nursing Facility Prior Approval Number:    Date Approved/Denied:   PASRR Number: [id number]  Discharge Plan: SNF    Current Diagnoses: Patient Active Problem List   Diagnosis Date Noted  . Chronic systolic heart failure (HCC)   . Closed olecranon fracture, left, initial encounter 10/21/2020  . Closed displaced transcondylar fracture of right humerus 10/21/2020  . Fall at home, initial encounter 10/21/2020  . Essential hypertension 10/21/2020  . Mixed hyperlipidemia 10/21/2020  . Leukocytosis 10/21/2020  . Exudative age-related macular degeneration of right eye with active choroidal neovascularization (HCC) 04/25/2020  . Cystoid macular edema of right eye 04/25/2020  . Intermediate stage nonexudative age-related macular degeneration of left eye 04/25/2020  . Abnormal EKG 02/16/2019  . Syncope 02/16/2019  . Cardiomyopathy (HCC) 02/16/2019    Orientation RESPIRATION BLADDER Height & Weight     Self, Place (intermittently with confusion at times is A&O x 4)  O2 (see d/c summary for oxygen needs) Incontinent Weight: 82.1 kg Height:  5\' 3"  (160 cm)  BEHAVIORAL SYMPTOMS/MOOD NEUROLOGICAL BOWEL NUTRITION STATUS      Continent    AMBULATORY STATUS COMMUNICATION OF NEEDS Skin   Extensive Assist Verbally Surgical wounds (Bilateral elbows with surgical incisions and wraps)                       Personal Care Assistance Level of Assistance   Bathing, Feeding, Dressing Bathing Assistance: Maximum assistance Feeding assistance: Maximum assistance Dressing Assistance: Maximum assistance     Functional Limitations Info  Hearing, Speech, Sight Sight Info: Adequate Hearing Info: Adequate Speech Info: Adequate    SPECIAL CARE FACTORS FREQUENCY  PT (By licensed PT), OT (By licensed OT)     PT Frequency: 5x/wk OT Frequency: 5x/wk            Contractures Contractures Info: Not present    Additional Factors Info  Code Status, Allergies, Psychotropic Code Status Info: Full Allergies Info: codeine/ sulfa antibiotics Psychotropic Info: Zoloft 50 mg daily         Current Medications (10/24/2020):  This is the current hospital active medication list Current Facility-Administered Medications  Medication Dose Route Frequency Provider Last Rate Last Admin  . acetaminophen (TYLENOL) tablet 500 mg  500 mg Oral Q8H 10/26/2020, PA-C   500 mg at 10/24/20 1522  . ascorbic acid (VITAMIN C) tablet 500 mg  500 mg Oral Daily 10/26/20, PA-C   500 mg at 10/24/20 1103  . bisacodyl (DULCOLAX) EC tablet 5 mg  5 mg Oral Daily 10/26/20, MD   5 mg at 10/24/20 1306  . calcitRIOL (ROCALTROL) capsule 0.5 mcg  0.5 mcg Oral Daily 10/26/20, PA-C   0.5 mcg at 10/24/20 1103  . carvedilol (COREG) tablet 6.25 mg  6.25 mg Oral BID WC 10/26/20, PA-C   6.25 mg at 10/24/20 10/26/20  . ceFAZolin (ANCEF) IVPB 1 g/50 mL premix  1 g Intravenous Q6H  Montez Morita, PA-C 100 mL/hr at 10/24/20 0177 1 g at 10/24/20 0832  . cholecalciferol (VITAMIN D3) tablet 2,000 Units  2,000 Units Oral Daily Montez Morita, PA-C   2,000 Units at 10/24/20 1103  . docusate sodium (COLACE) capsule 100 mg  100 mg Oral BID Montez Morita, PA-C   100 mg at 10/24/20 1102  . enoxaparin (LOVENOX) injection 40 mg  40 mg Subcutaneous Q24H Montez Morita, PA-C   40 mg at 10/24/20 1522  . hydrALAZINE (APRESOLINE) injection 10 mg  10 mg Intravenous Q6H PRN Montez Morita, PA-C   10 mg at  10/24/20 9390  . hydrALAZINE (APRESOLINE) tablet 50 mg  50 mg Oral Q8H Leroy Sea, MD   50 mg at 10/24/20 1522  . HYDROcodone-acetaminophen (NORCO/VICODIN) 5-325 MG per tablet 1 tablet  1 tablet Oral Q6H PRN Leroy Sea, MD      . meclizine (ANTIVERT) tablet 25 mg  25 mg Oral TID PRN Montez Morita, PA-C      . morphine 2 MG/ML injection 1 mg  1 mg Intravenous Q4H PRN Leroy Sea, MD      . ondansetron [facility]) injection 4 mg  4 mg Intravenous Q6H PRN Montez Morita, PA-C      . pantoprazole (PROTONIX) EC tablet 40 mg  40 mg Oral Daily Montez Morita, PA-C   40 mg at 10/24/20 1103  . polyethylene glycol (MIRALAX / GLYCOLAX) packet 17 g  17 g Oral Daily Leroy Sea, MD   17 g at 10/24/20 1125  . sertraline (ZOLOFT) tablet 50 mg  50 mg Oral Daily Montez Morita, PA-C   50 mg at 10/24/20 1103  . simvastatin (ZOCOR) tablet 40 mg  40 mg Oral Daily Montez Morita, PA-C   40 mg at 10/24/20 1103     Discharge Medications: Please see discharge summary for a list of discharge medications.  Relevant Imaging Results:  Relevant Lab Results:   Additional Information SS#: [id number]  Kermit Balo, RN

## 2020-10-24 NOTE — TOC Initial Note (Signed)
Transition of Care [facility]) - Initial/Assessment Note    Patient Details  Name: Kayla Carrillo MRN: [id number] Date of Birth: [birth year]  [facility] CM/SW Contact:    Kermit Balo, RN Phone Number: 10/24/2020, 3:32 PM  Clinical Narrative:                 Recommendations are for SNF rehab prior to returning home. Pt in agreement and asked to be faxed out in [facility] area. She also asked that CM contact her daughter. CM has left HIPPA appropriate voicemail and text for pts daughter.  TOC following and will provide bed offers once available. Pt will also need authorization through her HTA prior to d/c.     Expected Discharge Plan: Skilled Nursing Facility Barriers to Discharge: Continued Medical Work up   Patient Goals and CMS Choice   CMS Medicare.gov Compare Post Acute Care list provided to:: Patient Choice offered to / list presented to : Patient, Adult Children  Expected Discharge Plan and Services Expected Discharge Plan: Skilled Nursing Facility In-house Referral: Clinical Social Work Discharge Planning Services: CM Consult Post Acute Care Choice: Skilled Nursing Facility Living arrangements for the past 2 months: Apartment, Independent Living Facility                                      Prior Living Arrangements/Services Living arrangements for the past 2 months: Apartment, Marketing executive Lives with:: Self Patient language and need for interpreter reviewed:: Yes Do you feel safe going back to the place where you live?: Yes      Need for Family Participation in Patient Care: Yes (Comment) Care giver support system in place?: No (comment)   Criminal Activity/Legal Involvement Pertinent to Current Situation/Hospitalization: No - Comment as needed  Activities of Daily Living Home Assistive Devices/Equipment: None ADL Screening (condition at time of admission) Patient's cognitive ability adequate to safely complete daily  activities?: Yes Is the patient deaf or have difficulty hearing?: Yes Does the patient have difficulty seeing, even when wearing glasses/contacts?: No Does the patient have difficulty concentrating, remembering, or making decisions?: No Patient able to express need for assistance with ADLs?: No Does the patient have difficulty dressing or bathing?: Yes Independently performs ADLs?: No Communication: Needs assistance Does the patient have difficulty walking or climbing stairs?: No Weakness of Legs: Both  Permission Sought/Granted                  Emotional Assessment Appearance:: Appears stated age Attitude/Demeanor/Rapport: Engaged Affect (typically observed): Accepting Orientation: : Oriented to Self, Oriented to Place, Oriented to Situation   Psych Involvement: No (comment)  Admission diagnosis:  Fall, initial encounter [W19.XXXA] Closed fracture of both elbows, initial encounter [S42.401A, S42.402A] Impaired ambulation [R26.2] Patient Active Problem List   Diagnosis Date Noted  . Chronic systolic heart failure (HCC)   . Closed olecranon fracture, left, initial encounter 10/21/2020  . Closed displaced transcondylar fracture of right humerus 10/21/2020  . Fall at home, initial encounter 10/21/2020  . Essential hypertension 10/21/2020  . Mixed hyperlipidemia 10/21/2020  . Leukocytosis 10/21/2020  . Exudative age-related macular degeneration of right eye with active choroidal neovascularization (HCC) 04/25/2020  . Cystoid macular edema of right eye 04/25/2020  . Intermediate stage nonexudative age-related macular degeneration of left eye 04/25/2020  . Abnormal EKG 02/16/2019  . Syncope 02/16/2019  . Cardiomyopathy (HCC) 02/16/2019   PCP:  Docia Chuck,  [place], MD Pharmacy:   [facility] [address], [place] - [address] [place] [address] Phone: [phone] Fax: [phone]     Social Determinants of Health (SDOH) Interventions     Readmission Risk Interventions No flowsheet data found.

## 2020-10-24 NOTE — Progress Notes (Signed)
Received pt from PACU, disoriented X4, agitated, and confused, hallucinating, On call was paged and gave ativan dose which was administered, effective results noted, will continue to monitor and update on call.

## 2020-10-24 NOTE — Progress Notes (Signed)
PROGRESS NOTE                                                                                                                                                                                                             Patient Demographics:    Kayla Carrillo, is a 84 y.o. female, DOB - 08-Mar-1936, QMV:[id number]  Outpatient Primary MD for the patient is Docia Chuck, Dibas, MD    LOS - 3  Admit date - 10/21/2020    Chief Complaint  Patient presents with  . Fall       Brief Narrative (HPI from H&P) -  84 year old female with past medical history of hypertension, Takotsubo cardiomyopathy, gastroesophageal reflux disease, hyperlipidemia who presents to [facility] emergency department via EMS after patient fell injuring her arms, in the ER her work-up showed olecranon process fracture of the left elbow as well as a transcondylar fracture of the distal right humerus, orthopedics was consulted and hospitalist were requested to admit the patient.    Subjective:   Patient in bed, appears comfortable, denies any headache, no fever, no chest pain or pressure, no shortness of breath , no abdominal pain. No focal weakness.  Mild postop pain in her elbows.   Assessment  & Plan :     1. Mechanical fall with bilateral elbow fractures - olecranon process fracture of the left elbow as well as a transcondylar fracture of the distal right humerus - seen by ortho and underwent -   OPEN REDUCTION INTERNAL FIXATION (ORIF) RIGHT TRANSCONDYLAR DISTAL HUMERUS FRACTURE RIGHT ULNAR NERVE NEUROPLASTY OPEN REDUCTION INTERNAL FIXATION LEFT DISTAL HUMERUS FRACTURE (Left) OLECRANON FRACTURE  By Alvy BealMyrene Galas, MD on 10/23/20 -   Tolerated procedure well, she is able to move her fingers in both hands,  H&H stable, PT OT, range of motion and weightbearing per orthopedics Dr. Carola Frost.  Lovenox for DVT prophylaxis.  Will require  SNF.    2.  Recent history of Takotsubo cardiomyopathy with left bundle branch block.  Currently appears compensated, good exercise tolerance at baseline able to climb a flight of stairs without any chest discomfort, continue Coreg, statin along with hydralazine.  Repeat echocardiogram shows improvement in EF to 50% now without any wall motion abnormality, symptom-free, cardiology on board she follows with Dr. Rollene Rotunda.  3.  Essential hypertension.  On Coreg, hydralazine dose increased further for better control, also added as needed IV hydralazine.   4.  Stress related leukocytosis.  Asymptomatic, will check one-time bladder scan to rule out retention.  5.  Dyslipidemia.  On statin.     Condition - Fair  Family Communication  : Called daughter [phone] on 1024 12:18 PM.  Micah Flesher to answering machine, called again 10/23/2020 at 11:37 AM.  This is the wrong number listed in the chart.  Talked with daughter in the room phone number is Chyrl Civatte [phone] - 10/23/20   Code Status :  Full  Consults  :  Ortho, Cards  Procedures  :    PROCEDURE:   10/23/20  1. OPEN REDUCTION INTERNAL FIXATION (ORIF) RIGHT TRANSCONDYLAR DISTAL HUMERUS FRACTURE 2. RIGHT ULNAR NERVE NEUROPLASTY 3. OPEN REDUCTION INTERNAL FIXATION LEFT DISTAL HUMERUS FRACTURE (Left) OLECRANON FRACTURE  SURGEON:   Myrene Galas, MD - Primary   TTE - EF has now improved to 50% from 45% in 06/19/2019, grade 1 chronic diastolic CHF, no wall motion abnormality.  PUD Prophylaxis : PPI  Disposition Plan  :    Status is: Inpatient  Remains inpatient appropriate because:Inpatient level of care appropriate due to severity of illness   Dispo: The patient is from: Home              Anticipated d/c is to: SNF              Anticipated d/c date is: > 3 days              Patient currently is not medically stable to d/c.   DVT Prophylaxis  :  Lovenox  Lab Results  Component Value Date   PLT 233 10/24/2020     Diet :  Diet Order            Diet Heart Room service appropriate? Yes; Fluid consistency: Thin  Diet effective now                  Inpatient Medications  Scheduled Meds: . acetaminophen  500 mg Oral Q8H  . vitamin C  500 mg Oral Daily  . bisacodyl  5 mg Oral Daily  . calcitRIOL  0.5 mcg Oral Daily  . carvedilol  6.25 mg Oral BID WC  . cholecalciferol  2,000 Units Oral Daily  . docusate sodium  100 mg Oral BID  . enoxaparin (LOVENOX) injection  40 mg Subcutaneous Q24H  . furosemide  40 mg Oral Once  . hydrALAZINE  50 mg Oral Q8H  . LORazepam      . pantoprazole  40 mg Oral Daily  . polyethylene glycol  17 g Oral Daily  . sertraline  50 mg Oral Daily  . simvastatin  40 mg Oral Daily   Continuous Infusions: .  ceFAZolin (ANCEF) IV 1 g (10/24/20 0832)   PRN Meds:.hydrALAZINE, HYDROcodone-acetaminophen, meclizine, morphine injection **OR** [DISCONTINUED]  morphine injection, [DISCONTINUED] ondansetron **OR** ondansetron (ZOFRAN) IV  Antibiotics  :    Anti-infectives (From admission, onward)   Start     Dose/Rate Route Frequency Ordered Stop   10/24/20 0200  ceFAZolin (ANCEF) IVPB 1 g/50 mL premix        1 g 100 mL/hr over 30 Minutes Intravenous Every 6 hours 10/23/20 2238 10/24/20 1959   10/23/20 1615  ceFAZolin (ANCEF) IVPB 2g/100 mL premix        2 g 200 mL/hr over 30 Minutes Intravenous  Once 10/23/20 1602 10/23/20 2019   10/23/20  1605  ceFAZolin (ANCEF) 2-4 GM/100ML-% IVPB       Note to Pharmacy: Shireen Quan   : cabinet override      10/23/20 1605 10/23/20 1714       Time Spent in minutes  30   Susa Raring M.D on 10/24/2020 at 9:45 AM  To page go to [email or web address] - password [facility]  [facility] -  Office  [phone]    See all Orders from today for further details    Objective:   Vitals:   10/24/20 0600 10/24/20 0617 10/24/20 0748 10/24/20 0749  BP: (!) 196/97 (!) 173/78 (!) 150/71 (!) 150/71  Pulse: 98  85 86  Resp:   16 16   Temp: 98.2 F (36.8 C)  98.7 F (37.1 C) 98.7 F (37.1 C)  TempSrc: Oral  Oral Oral  SpO2: 97%  95% 100%  Weight:      Height:        Wt Readings from Last 3 Encounters:  10/21/20 82.1 kg  07/15/19 83.9 kg  06/29/19 83.6 kg     Intake/Output Summary (Last 24 hours) at 10/24/2020 0945 Last data filed at 10/24/2020 0330 Gross per 24 hour  Intake 1810 ml  Output 1250 ml  Net 560 ml     Physical Exam  Awake Alert, No new F.N deficits, Normal affect Babb.AT,PERRAL Supple Neck,No JVD, No cervical lymphadenopathy appriciated.  Symmetrical Chest wall movement, Good air movement bilaterally, CTAB RRR,No Gallops, Rubs or new Murmurs, No Parasternal Heave +ve B.Sounds, Abd Soft, No tenderness, No organomegaly appriciated, No rebound - guarding or rigidity. No Cyanosis,  both elbows under splint post op,    Data Review:    CBC Recent Labs  Lab 10/21/20 1850 10/22/20 0209 10/24/20 0457  WBC 12.6* 11.6* 12.6*  HGB 13.1 11.4* 13.1  HCT 40.5 35.8* 40.1  PLT 202 198 233  MCV 88.0 88.4 87.7  MCH 28.5 28.1 28.7  MCHC 32.3 31.8 32.7  RDW 13.8 13.7 13.5  LYMPHSABS  --  2.8 1.2  MONOABS  --  1.0 0.8  EOSABS  --  0.3 0.0  BASOSABS  --  0.1 0.0    Recent Labs  Lab 10/21/20 1850 10/22/20 0209 10/24/20 0457  NA 140 139 137  K 4.2 3.7 4.0  CL 106 104 103  CO2 GLUCOSE 109* 110* 155*  BUN CREATININE 0.83 0.72 0.63  CALCIUM 8.7* 8.5* 8.7*  AST  --  14* 17  ALT  --  11 12  ALKPHOS  --  68 81  BILITOT  --  0.8 0.7  ALBUMIN  --  3.1* 3.1*  MG  --  1.9 2.0  INR  --  1.2  --   TSH  --  2.560  --   BNP  --   --  330.1*    ------------------------------------------------------------------------------------------------------------------ No results for input(s): CHOL, HDL, LDLCALC, TRIG, CHOLHDL, LDLDIRECT in the last 72 hours.  No results found for:  HGBA1C ------------------------------------------------------------------------------------------------------------------ Recent Labs    10/22/20 0209  TSH 2.560    Cardiac Enzymes No results for input(s): CKMB, TROPONINI, MYOGLOBIN in the last 168 hours.  Invalid input(s): CK ------------------------------------------------------------------------------------------------------------------    Component Value Date/Time   BNP 330.1 (H) 10/24/2020 0457    Micro Results Recent Results (from the past 240 hour(s))  Respiratory Panel by RT PCR (Flu A&B, Covid) - Nasopharyngeal Swab     Status: None   Collection Time: 10/21/20  6:50  PM   Specimen: Nasopharyngeal Swab  Result Value Ref Range Status   SARS Coronavirus 2 by RT PCR NEGATIVE NEGATIVE Final    Comment: (NOTE) SARS-CoV-2 target nucleic acids are NOT DETECTED.  The SARS-CoV-2 RNA is generally detectable in upper respiratoy specimens during the acute phase of infection. The lowest concentration of SARS-CoV-2 viral copies this assay can detect is 131 copies/mL. A negative result does not preclude SARS-Cov-2 infection and should not be used as the sole basis for treatment or other patient management decisions. A negative result may occur with  improper specimen collection/handling, submission of specimen other than nasopharyngeal swab, presence of viral mutation(s) within the areas targeted by this assay, and inadequate number of viral copies (<131 copies/mL). A negative result must be combined with clinical observations, patient history, and epidemiological information. The expected result is Negative.  Fact Sheet for Patients:  https://www.moore.com/https://www.fda.gov/media/142436/download  Fact Sheet for Healthcare Providers:  https://www.young.biz/https://www.fda.gov/media/142435/download  This test is no t yet approved or cleared by the [place] FDA and  has been authorized for detection and/or diagnosis of SARS-CoV-2 by FDA under an Emergency Use  Authorization (EUA). This EUA will remain  in effect (meaning this test can be used) for the duration of the COVID-19 declaration under Section 564(b)(1) of the Act, 21 U.S.C. section 360bbb-3(b)(1), unless the authorization is terminated or revoked sooner.     Influenza A by PCR NEGATIVE NEGATIVE Final   Influenza B by PCR NEGATIVE NEGATIVE Final    Comment: (NOTE) The Xpert Xpress SARS-CoV-2/FLU/RSV assay is intended as an aid in  the diagnosis of influenza from Nasopharyngeal swab specimens and  should not be used as a sole basis for treatment. Nasal washings and  aspirates are unacceptable for Xpert Xpress SARS-CoV-2/FLU/RSV  testing.  Fact Sheet for Patients: https://www.moore.com/https://www.fda.gov/media/142436/download  Fact Sheet for Healthcare Providers: https://www.young.biz/https://www.fda.gov/media/142435/download  This test is not yet approved or cleared by the [place] FDA and  has been authorized for detection and/or diagnosis of SARS-CoV-2 by  FDA under an Emergency Use Authorization (EUA). This EUA will remain  in effect (meaning this test can be used) for the duration of the  Covid-19 declaration under Section 564(b)(1) of the Act, 21  U.S.C. section 360bbb-3(b)(1), unless the authorization is  terminated or revoked. Performed at [facility] [place], [address] [place], [place] [address]   Surgical pcr screen     Status: None   Collection Time: 10/22/20 12:04 AM   Specimen: Nasal Mucosa; Nasal Swab  Result Value Ref Range Status   MRSA, PCR NEGATIVE NEGATIVE Final   Staphylococcus aureus NEGATIVE NEGATIVE Final    Comment: (NOTE) The Xpert SA Assay (FDA approved for NASAL specimens in patients 82 years of age and older), is one component of a comprehensive surveillance program. It is not intended to diagnose infection nor to guide or monitor treatment. Performed at [facility], [address] [place], [place] [address]     Radiology Reports DG Elbow 2 Views  Left  Result Date: 10/23/2020 CLINICAL DATA:  Postoperative evaluation. EXAM: LEFT ELBOW - 2 VIEW COMPARISON:  October 21, 2020 FINDINGS: The left elbow was imaged in a fiberglass cast with subsequently obscured osseous detail. A radiopaque fixation plate and multiple fixation screws are seen along the dorsal aspect of the olecranon process. There is no evidence of dislocation. Nonspecific prominence of the radial tuberosity is seen. Soft tissues are unremarkable. IMPRESSION: Status post ORIF of the olecranon process. Electronically Signed  By: Aram Candela M.D.   On: 10/23/2020 22:02   DG Elbow 2 Views Left  Result Date: 10/23/2020 CLINICAL DATA:  Surgery.  Left and right elbow ORIF. EXAM: DG C-ARM 1-60 MIN; LEFT ELBOW - 2 VIEW; RIGHT ELBOW - 2 VIEW FLUOROSCOPY TIME:  Fluoroscopy Time: 30 seconds on the left, 37 seconds on the right Radiation Exposure Index (if provided by the fluoroscopic device): Not provided. Number of Acquired Spot Images: 3 if the right, 6 on the left. COMPARISON:  Preoperative imaging 10/21/2020. FINDINGS: Right elbow: Medial and lateral plate and multi screw fixation of comminuted distal humerus fracture. The fracture is in improved alignment compared to preoperative imaging. Left elbow: Plate and screw fixation of displaced olecranon fracture. Fracture is in improved alignment compared to preoperative imaging. IMPRESSION: 1. Procedural fluoroscopy for ORIF of right distal humerus fracture. 2. Procedural fluoroscopy for ORIF of left olecranon fracture. Electronically Signed   By: Narda Rutherford M.D.   On: 10/23/2020 20:55   DG Elbow 2 Views Right  Result Date: 10/23/2020 CLINICAL DATA:  Postoperative evaluation. EXAM: RIGHT ELBOW - 2 VIEW COMPARISON:  None. FINDINGS: Two radiopaque fixation plates and screws are seen along the dorsal aspect of the distal right humerus. The acute transcondylar fracture of the distal right humerus is poorly visualized. There is no evidence  of dislocation. Soft tissues are unremarkable. IMPRESSION: Status post open reduction and internal fixation of the distal right humerus. Electronically Signed   By: Aram Candela M.D.   On: 10/23/2020 22:04   DG Elbow 2 Views Right  Result Date: 10/23/2020 CLINICAL DATA:  Surgery.  Left and right elbow ORIF. EXAM: DG C-ARM 1-60 MIN; LEFT ELBOW - 2 VIEW; RIGHT ELBOW - 2 VIEW FLUOROSCOPY TIME:  Fluoroscopy Time: 30 seconds on the left, 37 seconds on the right Radiation Exposure Index (if provided by the fluoroscopic device): Not provided. Number of Acquired Spot Images: 3 if the right, 6 on the left. COMPARISON:  Preoperative imaging 10/21/2020. FINDINGS: Right elbow: Medial and lateral plate and multi screw fixation of comminuted distal humerus fracture. The fracture is in improved alignment compared to preoperative imaging. Left elbow: Plate and screw fixation of displaced olecranon fracture. Fracture is in improved alignment compared to preoperative imaging. IMPRESSION: 1. Procedural fluoroscopy for ORIF of right distal humerus fracture. 2. Procedural fluoroscopy for ORIF of left olecranon fracture. Electronically Signed   By: Narda Rutherford M.D.   On: 10/23/2020 20:55   DG Elbow Complete Left  Result Date: 10/21/2020 CLINICAL DATA:  Left elbow pain after fall EXAM: LEFT ELBOW - COMPLETE 3+ VIEW COMPARISON:  None. FINDINGS: Acute fracture of the olecranon process of the proximal ulna with 4 mm of fracture distraction. Fracture extends intra-articularly to the ulnotrochlear joint. There is a small elbow joint hemarthrosis. No additional fractures are identified. There is prominence of the radial tuberosity, nonspecific. Degenerative changes of the elbow joint. Enthesopathic changes at the medial and lateral humeral epicondyles. Prominent soft tissue swelling/hematoma overlying the fracture site at the posterior elbow. IMPRESSION: Left elbow: 1. Acute fracture of the olecranon process of the proximal  ulna with 4 mm of fracture distraction. 2. Prominent soft tissue swelling/hematoma overlying the fracture site at the fracture site. Electronically Signed   By: Duanne Guess D.O.   On: 10/21/2020 17:31   DG Elbow Complete Right  Result Date: 10/21/2020 CLINICAL DATA:  Fall, right elbow pain EXAM: RIGHT ELBOW - COMPLETE 3+ VIEW COMPARISON:  None. FINDINGS: Acute transcondylar fracture of  the distal right humerus with intra-articular extension. Slight anterior displacement and angulation at the fracture site. No fractures identified involving the proximal radius or ulna. There is a large elbow joint hemarthrosis. Diffuse soft tissue swelling. IMPRESSION: Right elbow: Acute transcondylar fracture of the distal right humerus with intra-articular extension and slight anterior displacement and angulation. Electronically Signed   By: Duanne Guess D.O.   On: 10/21/2020 17:35   CT Head Wo Contrast  Result Date: 10/21/2020 CLINICAL DATA:  84 year old female with fall and head injury today. EXAM: CT HEAD WITHOUT CONTRAST TECHNIQUE: Contiguous axial images were obtained from the base of the skull through the vertex without intravenous contrast. COMPARISON:  None. FINDINGS: Brain: No evidence of acute infarction, hemorrhage, hydrocephalus, extra-axial collection or mass lesion/mass effect. Moderate periventricular white matter hypodensities are of uncertain chronicity but most likely represent chronic small-vessel white matter ischemic changes. Mild generalized cerebral atrophy is noted. Vascular: Carotid atherosclerotic calcifications are noted. Skull: Normal. Negative for fracture or focal lesion. Sinuses/Orbits: No acute finding. Other: None. IMPRESSION: 1. No evidence of acute intracranial abnormality. 2. Moderate periventricular white matter hypodensities - likely chronic small-vessel white matter ischemic changes. 3. Mild generalized cerebral atrophy. Electronically Signed   By: Harmon Pier M.D.   On:  10/21/2020 17:31   DG C-Arm 1-60 Min  Result Date: 10/23/2020 CLINICAL DATA:  Surgery.  Left and right elbow ORIF. EXAM: DG C-ARM 1-60 MIN; LEFT ELBOW - 2 VIEW; RIGHT ELBOW - 2 VIEW FLUOROSCOPY TIME:  Fluoroscopy Time: 30 seconds on the left, 37 seconds on the right Radiation Exposure Index (if provided by the fluoroscopic device): Not provided. Number of Acquired Spot Images: 3 if the right, 6 on the left. COMPARISON:  Preoperative imaging 10/21/2020. FINDINGS: Right elbow: Medial and lateral plate and multi screw fixation of comminuted distal humerus fracture. The fracture is in improved alignment compared to preoperative imaging. Left elbow: Plate and screw fixation of displaced olecranon fracture. Fracture is in improved alignment compared to preoperative imaging. IMPRESSION: 1. Procedural fluoroscopy for ORIF of right distal humerus fracture. 2. Procedural fluoroscopy for ORIF of left olecranon fracture. Electronically Signed   By: Narda Rutherford M.D.   On: 10/23/2020 20:55   ECHOCARDIOGRAM COMPLETE  Result Date: 10/22/2020    ECHOCARDIOGRAM REPORT   Patient Name:   Kayla Carrillo Date of Exam: 10/22/2020 Medical Rec #:  [id number]          Height:       63.0 in Accession #:    [id number]         Weight:       181.0 lb Date of Birth:  1936-11-08          BSA:          1.853 m Patient Age:    84 years           BP:           146/65 mmHg Patient Gender: F                  HR:           75 bpm. Exam Location:  Inpatient Procedure: 2D Echo, Color Doppler and Cardiac Doppler Indications:    Pre-op Evaluation  History:        Patient has prior history of Echocardiogram examinations, most                 recent 06/17/2019. CHF; Risk Factors:Hypertension and  Dyslipidemia.  Sonographer:    Irving Burton Senior RDCS Referring Phys: [id number] Ellsworth Lennox IMPRESSIONS  1. Left ventricular ejection fraction, by estimation, is 50 to 55%. The left ventricle has low normal function. The left ventricle  has no regional wall motion abnormalities. There is mild concentric left ventricular hypertrophy. Left ventricular diastolic parameters are consistent with Grade I diastolic dysfunction (impaired relaxation).  2. Right ventricular systolic function is normal. The right ventricular size is normal. Tricuspid regurgitation signal is inadequate for assessing PA pressure.  3. The mitral valve is degenerative. No evidence of mitral valve regurgitation. No evidence of mitral stenosis.  4. The aortic valve is grossly normal. Aortic valve regurgitation is not visualized. No aortic stenosis is present.  5. The inferior vena cava is normal in size with greater than 50% respiratory variability, suggesting right atrial pressure of 3 mmHg. Comparison(s): Prior images reviewed side by side. Changes from prior study are noted. The left ventricular function has improved. FINDINGS  Left Ventricle: Left ventricular ejection fraction, by estimation, is 50 to 55%. The left ventricle has low normal function. The left ventricle has no regional wall motion abnormalities. The left ventricular internal cavity size was normal in size. There is mild concentric left ventricular hypertrophy. Left ventricular diastolic parameters are consistent with Grade I diastolic dysfunction (impaired relaxation). Right Ventricle: The right ventricular size is normal. No increase in right ventricular wall thickness. Right ventricular systolic function is normal. Tricuspid regurgitation signal is inadequate for assessing PA pressure. Left Atrium: Left atrial size was normal in size. Right Atrium: Right atrial size was normal in size. Pericardium: Trivial pericardial effusion is present. Presence of pericardial fat pad. Mitral Valve: The mitral valve is degenerative in appearance. Mild mitral annular calcification. No evidence of mitral valve regurgitation. No evidence of mitral valve stenosis. Tricuspid Valve: The tricuspid valve is grossly normal. Tricuspid  valve regurgitation is trivial. No evidence of tricuspid stenosis. Aortic Valve: The aortic valve is grossly normal. Aortic valve regurgitation is not visualized. No aortic stenosis is present. Pulmonic Valve: The pulmonic valve was grossly normal. Pulmonic valve regurgitation is not visualized. No evidence of pulmonic stenosis. Aorta: The aortic root is normal in size and structure. Venous: The inferior vena cava is normal in size with greater than 50% respiratory variability, suggesting right atrial pressure of 3 mmHg. IAS/Shunts: The atrial septum is grossly normal.  LEFT VENTRICLE PLAX 2D LVIDd:         4.30 cm  Diastology LVIDs:         3.20 cm  LV e' medial:    4.68 cm/s LV PW:         1.30 cm  LV E/e' medial:  16.7 LV IVS:        1.30 cm  LV e' lateral:   5.33 cm/s LVOT diam:     1.90 cm  LV E/e' lateral: 14.6 LV SV:         54 LV SV Index:   29 LVOT Area:     2.84 cm  RIGHT VENTRICLE RV S prime:     11.40 cm/s TAPSE (M-mode): 2.2 cm LEFT ATRIUM             Index       RIGHT ATRIUM           Index LA diam:        3.60 cm 1.94 cm/m  RA Area:     16.80 cm LA Vol (A2C):   46.9 ml 25.31  ml/m RA Volume:   41.20 ml  22.23 ml/m LA Vol (A4C):   65.3 ml 35.23 ml/m LA Biplane Vol: 58.2 ml 31.40 ml/m  AORTIC VALVE LVOT Vmax:   86.20 cm/s LVOT Vmean:  62.000 cm/s LVOT VTI:    0.189 m  AORTA Ao Root diam: 3.00 cm MITRAL VALVE MV Area (PHT): 3.21 cm     SHUNTS MV Decel Time: 236 msec     Systemic VTI:  0.19 m MV E velocity: 78.00 cm/s   Systemic Diam: 1.90 cm MV A velocity: 102.00 cm/s MV E/A ratio:  0.76 Lennie Odor MD Electronically signed by Lennie Odor MD Signature Date/Time: 10/22/2020/1:57:05 PM    Final

## 2020-10-25 ENCOUNTER — Encounter (HOSPITAL_COMMUNITY): Payer: Self-pay | Admitting: Orthopedic Surgery

## 2020-10-25 LAB — COMPREHENSIVE METABOLIC PANEL
ALT: 10 U/L (ref 0–44)
AST: 14 U/L — ABNORMAL LOW (ref 15–41)
Albumin: 2.8 g/dL — ABNORMAL LOW (ref 3.5–5.0)
Alkaline Phosphatase: 69 U/L (ref 38–126)
Anion gap: 13 (ref 5–15)
BUN: 13 mg/dL (ref 8–23)
CO2: 23 mmol/L (ref 22–32)
Calcium: 8.4 mg/dL — ABNORMAL LOW (ref 8.9–10.3)
Chloride: 102 mmol/L (ref 98–111)
Creatinine, Ser: 0.67 mg/dL (ref 0.44–1.00)
GFR, Estimated: >60 mL/min (ref >60)
Glucose, Bld: 143 mg/dL — ABNORMAL HIGH (ref 70–99)
Potassium: 3.5 mmol/L (ref 3.5–5.1)
Sodium: 138 mmol/L (ref 135–145)
Total Bilirubin: 0.9 mg/dL (ref 0.3–1.2)
Total Protein: 6.3 g/dL — ABNORMAL LOW (ref 6.5–8.1)

## 2020-10-25 LAB — CBC WITH DIFFERENTIAL/PLATELET
Abs Immature Granulocytes: 0.04 10*3/uL (ref 0.00–0.07)
Basophils Absolute: 0.1 10*3/uL (ref 0.0–0.1)
Basophils Relative: 1 %
Eosinophils Absolute: 0.1 10*3/uL (ref 0.0–0.5)
Eosinophils Relative: 1 %
HCT: 36.7 % (ref 36.0–46.0)
Hemoglobin: 11.7 g/dL — ABNORMAL LOW (ref 12.0–15.0)
Immature Granulocytes: 0 %
Lymphocytes Relative: 16 %
Lymphs Abs: 1.9 10*3/uL (ref 0.7–4.0)
MCH: 27.9 pg (ref 26.0–34.0)
MCHC: 31.9 g/dL (ref 30.0–36.0)
MCV: 87.6 fL (ref 80.0–100.0)
Monocytes Absolute: 1.3 10*3/uL — ABNORMAL HIGH (ref 0.1–1.0)
Monocytes Relative: 11 %
Neutro Abs: 8.8 10*3/uL — ABNORMAL HIGH (ref 1.7–7.7)
Neutrophils Relative %: 71 %
Platelets: 224 10*3/uL (ref 150–400)
RBC: 4.19 MIL/uL (ref 3.87–5.11)
RDW: 13.8 % (ref 11.5–15.5)
WBC: 12.3 10*3/uL — ABNORMAL HIGH (ref 4.0–10.5)
nRBC: 0 % (ref 0.0–0.2)

## 2020-10-25 LAB — MAGNESIUM: Magnesium: 2 mg/dL (ref 1.7–2.4)

## 2020-10-25 LAB — BRAIN NATRIURETIC PEPTIDE: B Natriuretic Peptide: 130.9 pg/mL — ABNORMAL HIGH (ref 0.0–100.0)

## 2020-10-25 LAB — SARS CORONAVIRUS 2 BY RT PCR (HOSPITAL ORDER, PERFORMED IN ~~LOC~~ HOSPITAL LAB): SARS Coronavirus 2: NEGATIVE

## 2020-10-25 MED ORDER — POTASSIUM CHLORIDE CRYS ER 20 MEQ PO TBCR
40.0000 meq | EXTENDED_RELEASE_TABLET | Freq: Once | ORAL | Status: AC
  Administered 2020-10-25: 40 meq via ORAL
  Filled 2020-10-25: qty 2

## 2020-10-25 MED ORDER — FUROSEMIDE 40 MG PO TABS
40.0000 mg | ORAL_TABLET | Freq: Once | ORAL | Status: AC
  Administered 2020-10-25: 40 mg via ORAL
  Filled 2020-10-25: qty 1

## 2020-10-25 MED ORDER — HYDROCODONE-ACETAMINOPHEN 5-325 MG PO TABS
1.0000 | ORAL_TABLET | Freq: Three times a day (TID) | ORAL | Status: DC | PRN
  Administered 2020-10-25: 1 via ORAL
  Filled 2020-10-25: qty 1

## 2020-10-25 MED ORDER — MORPHINE SULFATE (PF) 2 MG/ML IV SOLN: 1.0000 mg | Freq: Four times a day (QID) | INTRAVENOUS | Status: DC | PRN

## 2020-10-25 NOTE — Progress Notes (Signed)
Orthopaedic Trauma Service Progress Note  Patient ID: Kayla Carrillo MRN: [id number] DOB/AGE: 1936-01-05 84 y.o.  Subjective:  Sitting up in chair Pleasant  No acute issues reported Denies pain B elbows    ROS As above   Objective:   VITALS:   Vitals:   10/25/20 0006 10/25/20 0442 10/25/20 0808 10/25/20 1117  BP: (!) 121/98 (!) 162/62 (!) 165/66 (!) 131/42  Pulse: 87 86 72 78  Resp: 16 19 18 18   Temp: 98.8 F (37.1 C)  (!) 97.5 F (36.4 C) 98 F (36.7 C)  TempSrc: Oral  Oral Oral  SpO2: 97% 94% 99% 95%  Weight:      Height:        Estimated body mass index is 32.06 kg/m as calculated from the following:   Height as of this encounter: 5\' 3"  (1.6 m).   Weight as of this encounter: 82.1 kg.   Intake/Output      10/26 0701 - 10/27 0700 10/27 0701 - 10/28 0700   P.O. 120    I.V. (mL/kg)     IV Piggyback     Total Intake(mL/kg) 120 (1.5)    Urine (mL/kg/hr) 1950 (1)    Blood     Total Output 1950    Net -1830           LABS  Results for orders placed or performed during the hospital encounter of 10/21/20 (from the past 24 hour(s))  CBC with Differential/Platelet     Status: Abnormal   Collection Time: 10/25/20  4:32 AM  Result Value Ref Range   WBC 12.3 (H) 4.0 - 10.5 K/uL   RBC 4.19 3.87 - 5.11 MIL/uL   Hemoglobin 11.7 (L) 12.0 - 15.0 g/dL   HCT 10/23/20 36 - 46 %   MCV 87.6 80.0 - 100.0 fL   MCH 27.9 26.0 - 34.0 pg   MCHC 31.9 30.0 - 36.0 g/dL   RDW 10/27/20 29.5 - 28.4 %   Platelets 224 150 - 400 K/uL   nRBC 0.0 0.0 - 0.2 %   Neutrophils Relative % 71 %   Neutro Abs 8.8 (H) 1.7 - 7.7 K/uL   Lymphocytes Relative 16 %   Lymphs Abs 1.9 0.7 - 4.0 K/uL   Monocytes Relative 11 %   Monocytes Absolute 1.3 (H) 0.1 - 1.0 K/uL   Eosinophils Relative 1 %   Eosinophils Absolute 0.1 0.0 - 0.5 K/uL   Basophils Relative 1 %   Basophils Absolute 0.1 0.0 - 0.1 K/uL   Immature Granulocytes  0 %   Abs Immature Granulocytes 0.04 0.00 - 0.07 K/uL  Brain natriuretic peptide     Status: Abnormal   Collection Time: 10/25/20  4:32 AM  Result Value Ref Range   B Natriuretic Peptide 130.9 (H) 0.0 - 100.0 pg/mL  Magnesium     Status: None   Collection Time: 10/25/20  4:32 AM  Result Value Ref Range   Magnesium 2.0 1.7 - 2.4 mg/dL  Comprehensive metabolic panel     Status: Abnormal   Collection Time: 10/25/20  4:32 AM  Result Value Ref Range   Sodium 138 135 - 145 mmol/L   Potassium 3.5 3.5 - 5.1 mmol/L   Chloride 102 98 - 111 mmol/L   CO2 23 22 - 32 mmol/L   Glucose,  Bld 143 (H) 70 - 99 mg/dL   BUN 13 8 - 23 mg/dL   Creatinine, Ser 6.04 0.44 - 1.00 mg/dL   Calcium 8.4 (L) 8.9 - 10.3 mg/dL   Total Protein 6.3 (L) 6.5 - 8.1 g/dL   Albumin 2.8 (L) 3.5 - 5.0 g/dL   AST 14 (L) 15 - 41 U/L   ALT 10 0 - 44 U/L   Alkaline Phosphatase 69 38 - 126 U/L   Total Bilirubin 0.9 0.3 - 1.2 mg/dL   GFR, Estimated >54 >09 mL/min   Anion gap 13 5 - 15     PHYSICAL EXAM:  Gen: sitting up in chair, NAD, appears well  Lungs: unlabored Ext:       B upper extremities              LAS intact bilaterally, fitting well. Moleskin to distal aspects of splints for added comfort/protection              Moderate swelling to hands B stable, slightly improved              Exts are warm                Good perfusion distally              Radial, ulnar, median, AIN, PIN motor intact B              Radial, ulnar, median sensation intact B              No pain out of proportion with passive stretching of the digits      Assessment/Plan: 2 Days Post-Op   Principal Problem:   Fall at home, initial encounter Active Problems:   Closed olecranon fracture, left, initial encounter   Closed displaced transcondylar fracture of right humerus   Essential hypertension   Mixed hyperlipidemia   Leukocytosis   Chronic systolic heart failure (HCC)   Anti-infectives (From admission, onward)   Start      Dose/Rate Route Frequency Ordered Stop   10/24/20 0200  ceFAZolin (ANCEF) IVPB 1 g/50 mL premix        1 g 100 mL/hr over 30 Minutes Intravenous Every 6 hours 10/23/20 2238 10/25/20 1041   10/23/20 1615  ceFAZolin (ANCEF) IVPB 2g/100 mL premix        2 g 200 mL/hr over 30 Minutes Intravenous  Once 10/23/20 1602 10/23/20 2019   10/23/20 1605  ceFAZolin (ANCEF) 2-4 GM/100ML-% IVPB       Note to Pharmacy: Shireen Quan   : cabinet override      10/23/20 1605 10/23/20 1714    .  POD/HD#: 2  84 y/o female s/p ground level fall with R distal humerus fracture and left olecranon fracture    -ground level fall   - R distal humerus fracture s/p ORIF and L olecranon fracture s/p ORIF              NWB B UEx              Splint B UEx for about 7-10 day then initiate ROM              Ok to move fingers and wrist             Ice and elevate for swelling and pain control  Elevate hand above elbow and elbow above heart                PT/OT    - Pain management:             Minimize narcotics              On scheduled tylenol    - Medical issues              Per primary    - DVT/PE prophylaxis:             Lovenox while inpatient    - ID:              periop abx   - Metabolic Bone Disease:             Vitamin d insufficiency                          Supplement   - Activity:             Up with assistance only    - FEN/GI prophylaxis/Foley/Lines:             Bowl regimen              Perineal care    -Ex-fix/Splint care:             Keep splints clean and dry    - Impediments to fracture healing:             Vitamin d insufficiency              Low energy fracture              Ability to follow medical direction    - Dispo:             Ortho issues addressed and stable              PT/OT             SNF               Follow up with ortho in 10 days    Mearl Latin, PA-C [phone] (C) 10/25/2020, 11:31 AM  [facility] [address] [place] [address] [phone] Val [phone] (F)    After 5pm and on the weekends please log on to Amion, go to orthopaedics and the look under the [facility] Call for the provider(s) on call. You can also call our office at [phone] and then follow the prompts to be connected to the call team.

## 2020-10-25 NOTE — Progress Notes (Signed)
Occupational Therapy Treatment Patient Details Name: Kayla Carrillo MRN: [id number] DOB: 1936-08-30 Today's Date: 10/25/2020    History of present illness Patient is a 84 y/o female who presents with Bil UE fxs s/p fall and now s/p ORIF right humerus and ORIF left olecranon fx 10/23/20. PMH includes cardiomyopathy, depression witih anxiety, dyslipidemia.   OT comments  Pt completed transfer to chair for breakfast and lunch today. Pt with bil UE elevated and edema management education bil UE hand arom performed. Pt with cognitive deficits noted but improved from evaluation. Pt with no recall of evaluation education or transfer. Recommendation for SNF at this time.   Follow Up Recommendations  SNF;Supervision - Intermittent    Equipment Recommendations  3 in 1 bedside commode    Recommendations for Other Services      Precautions / Restrictions Precautions Precautions: Fall;Other (comment) Precaution Comments: watch 02 Required Braces or Orthoses: Splint/Cast Splint/Cast: BUEs-- noted to have mold skin applied to edge of cast but concern for swelling at this time Restrictions Weight Bearing Restrictions: Yes RUE Weight Bearing: Non weight bearing LUE Weight Bearing: Non weight bearing       Mobility Bed Mobility Overal bed mobility: Needs Assistance Bed Mobility: Supine to Sit     Supine to sit: Mod assist     General bed mobility comments: able to bring bil LE toward eob and initiated supine to sit with long sitting. pt needs (A) to complete upright posture. pt needed (A) of pad to pivot buttock to the eob  Transfers Overall transfer level: Needs assistance Equipment used: 1 person hand held assist Transfers: Stand Pivot Transfers;Sit to/from Stand Sit to Stand: Max assist Stand pivot transfers: Max assist       General transfer comment: pt pivot to chair on left side and able to help scoot hips back in the chair     Balance Overall balance assessment: Needs  assistance Sitting-balance support: No upper extremity supported;Feet supported Sitting balance-Leahy Scale: Fair     Standing balance support: During functional activity Standing balance-Leahy Scale: Poor                             ADL either performed or assessed with clinical judgement   ADL Overall ADL's : Needs assistance/impaired Eating/Feeding: Total assistance Eating/Feeding Details (indicate cue type and reason): feeding her breakfast but able to direct needs         Lower Body Bathing: Total assistance Lower Body Bathing Details (indicate cue type and reason): aware of incontinence due to purewick leaking and asking for hygiene. pt states "i just dontwant to smeel like pee"     Lower Body Dressing: Total assistance   Toilet Transfer: Maximal assistance;Stand-pivot Toilet Transfer Details (indicate cue type and reason): simulated EOB to chair            General ADL Comments: pt requesting to sit up to eat and get OOB     Vision       Perception     Praxis      Cognition Arousal/Alertness: Awake/alert Behavior During Therapy: WFL for tasks assessed/performed Overall Cognitive Status: Impaired/Different from baseline                                 General Comments: Pt able to verbalize that visitor in previous session was ex daugther in law Melody that she coudl not do  in previous session. pt with no recall of yesterdays session at all         Exercises Other Exercises Other Exercises: AROM bil hands elevated to decr edema   Shoulder Instructions       General Comments reports pain at L wrirst from splint. contacted Montez Morita and give verbal okay to pull back ace wrap and dressing to assess skin for pressure. pt noted to have slight redness as the base of the epicodyle but reports relieved pain after task. OT adjustign mole skin in the space to decrease risk for pressure    Pertinent Vitals/ Pain       Pain Assessment:  Faces Faces Pain Scale: Hurts even more Pain Location: BIL UE but R >L Pain Descriptors / Indicators: Operative site guarding;Aching Pain Intervention(s): Monitored during session;Premedicated before session;Repositioned  Home Living                                          Prior Functioning/Environment              Frequency  Min 3X/week        Progress Toward Goals  OT Goals(current goals can now be found in the care plan section)  Progress towards OT goals: Progressing toward goals  Acute Rehab OT Goals Patient Stated Goal: get therapy to get stronger OT Goal Formulation: With patient/family Time For Goal Achievement: 11/07/20 Potential to Achieve Goals: Good ADL Goals Pt Will Transfer to Toilet: with mod assist;ambulating;bedside commode Additional ADL Goal #1: pt will complete bed mobiltiy min (A) as precursor to adls with hob 30 degrees  Plan Discharge plan remains appropriate    Co-evaluation                 AM-PAC OT "6 Clicks" Daily Activity     Outcome Measure   Help from another person eating meals?: Total Help from another person taking care of personal grooming?: Total Help from another person toileting, which includes using toliet, bedpan, or urinal?: Total Help from another person bathing (including washing, rinsing, drying)?: Total Help from another person to put on and taking off regular upper body clothing?: Total Help from another person to put on and taking off regular lower body clothing?: Total 6 Click Score: 6    End of Session Equipment Utilized During Treatment: Oxygen  OT Visit Diagnosis: Unsteadiness on feet (R26.81);Muscle weakness (generalized) (M62.81);Pain Pain - Right/Left: Left Pain - part of body: Arm   Activity Tolerance Patient tolerated treatment well   Patient Left in chair;with call bell/phone within reach;with chair alarm set;with nursing/sitter in room   Nurse Communication Mobility  status;Precautions        Time: 6063-0160 OT Time Calculation (min): 44 min  Charges: OT General Charges $OT Visit: 1 Visit OT Treatments $Self Care/Home Management : 38-52 mins   Brynn, OTR/L  Acute Rehabilitation Services Pager: [phone] Office: [phone] .    Mateo Flow 10/25/2020, 1:12 PM

## 2020-10-25 NOTE — Progress Notes (Signed)
PROGRESS NOTE                                                                                                                                                                                                             Patient Demographics:    Kayla Carrillo, is a 84 y.o. female, DOB - 12/22/1936, ZOX:[id number]  Outpatient Primary MD for the patient is Koirala, Dibas, MD    LOS - 4  Admit date - 10/21/2020    Chief Complaint  Patient presents with   Fall       Brief Narrative (HPI from H&P) -  84 year old female with past medical history of hypertension, Takotsubo cardiomyopathy, gastroesophageal reflux disease, hyperlipidemia who presents to [facility] emergency department via EMS after patient fell injuring her arms, in the ER her work-up showed olecranon process fracture of the left elbow as well as a transcondylar fracture of the distal right humerus, orthopedics was consulted and hospitalist were requested to admit the patient.    Subjective:   Patient in bed, appears comfortable, denies any headache, no fever, no chest pain or pressure, no shortness of breath , no abdominal pain. No focal weakness. Mild postop pain in her elbows.   Assessment  & Plan :     1. Mechanical fall with bilateral elbow fractures - olecranon process fracture of the left elbow as well as a transcondylar fracture of the distal right humerus - seen by ortho and underwent -   OPEN REDUCTION INTERNAL FIXATION (ORIF) RIGHT TRANSCONDYLAR DISTAL HUMERUS FRACTURE RIGHT ULNAR NERVE NEUROPLASTY OPEN REDUCTION INTERNAL FIXATION LEFT DISTAL HUMERUS FRACTURE (Left) OLECRANON FRACTURE  By Alvy BealMyrene Galas, MD on 10/23/20 -   Tolerated procedure well, she is able to move her fingers in both hands,  H&H stable, PT OT, range of motion and weightbearing per orthopedics Dr. Carola Frost.  Lovenox for DVT prophylaxis.  Will require  SNF.   2.  Recent history of Takotsubo cardiomyopathy with left bundle branch block.  Currently appears compensated, good exercise tolerance at baseline able to climb a flight of stairs without any chest discomfort, continue Coreg, statin along with hydralazine.  Repeat echocardiogram shows improvement in EF to 50% now without any wall motion abnormality, symptom-free, cardiology on board she follows with Dr. Rollene Rotunda.  3.  Essential hypertension.  On Coreg,  hydralazine dose increased further for better control, also added as needed IV hydralazine.   4.  Stress related leukocytosis.  Asymptomatic, stable random bladder scan on 10/24/20.  5. Dyslipidemia.  On statin.     Condition - Fair  Family Communication  :  Talked with daughter Eligah EastJoannn  in the room phone number is Chyrl CivatteJoann [phone] - 10/23/20, 10/25/20  Code Status :  Full  Consults  :  Ortho, Cards  Procedures  :    PROCEDURE:   10/23/20  1. OPEN REDUCTION INTERNAL FIXATION (ORIF) RIGHT TRANSCONDYLAR DISTAL HUMERUS FRACTURE 2. RIGHT ULNAR NERVE NEUROPLASTY 3. OPEN REDUCTION INTERNAL FIXATION LEFT DISTAL HUMERUS FRACTURE (Left) OLECRANON FRACTURE  SURGEON:   Myrene GalasHandy, Michael, MD - Primary   TTE - EF has now improved to 50% from 45% in 06/19/2019, grade 1 chronic diastolic CHF, no wall motion abnormality.   PUD Prophylaxis : PPI  Disposition Plan  :    Status is: Inpatient  Remains inpatient appropriate because:Inpatient level of care appropriate due to severity of illness   Dispo: The patient is from: Home              Anticipated d/c is to: SNF              Anticipated d/c date is: > 3 days              Patient currently is not medically stable to d/c.   DVT Prophylaxis  :  Lovenox  Lab Results  Component Value Date   PLT 224 10/25/2020    Diet :  Diet Order            Diet Heart Room service appropriate? Yes; Fluid consistency: Thin  Diet effective now                  Inpatient  Medications  Scheduled Meds:  acetaminophen  500 mg Oral Q8H   vitamin C  500 mg Oral Daily   bisacodyl  5 mg Oral Daily   calcitRIOL  0.5 mcg Oral Daily   carvedilol  6.25 mg Oral BID WC   cholecalciferol  2,000 Units Oral Daily   docusate sodium  100 mg Oral BID   enoxaparin (LOVENOX) injection  40 mg Subcutaneous Q24H   hydrALAZINE  50 mg Oral Q8H   pantoprazole  40 mg Oral Daily   polyethylene glycol  17 g Oral Daily   sertraline  50 mg Oral Daily   simvastatin  40 mg Oral Daily   Continuous Infusions:  PRN Meds:.hydrALAZINE, HYDROcodone-acetaminophen, meclizine, morphine injection **OR** [DISCONTINUED]  morphine injection, [DISCONTINUED] ondansetron **OR** ondansetron (ZOFRAN) IV  Antibiotics  :    Anti-infectives (From admission, onward)   Start     Dose/Rate Route Frequency Ordered Stop   10/24/20 0200  ceFAZolin (ANCEF) IVPB 1 g/50 mL premix        1 g 100 mL/hr over 30 Minutes Intravenous Every 6 hours 10/23/20 2238 10/25/20 1041   10/23/20 1615  ceFAZolin (ANCEF) IVPB 2g/100 mL premix        2 g 200 mL/hr over 30 Minutes Intravenous  Once 10/23/20 1602 10/23/20 2019   10/23/20 1605  ceFAZolin (ANCEF) 2-4 GM/100ML-% IVPB       Note to Pharmacy: Shireen Quanodd, Robert   : cabinet override      10/23/20 1605 10/23/20 1714       Time Spent in minutes  30   Susa RaringPrashant Abdelaziz Westenberger M.D on 10/25/2020 at 10:50 AM  To  page go to [email or web address] - password [email or web address]  [facility] -  Office  [phone]    See all Orders from today for further details    Objective:   Vitals:   10/24/20 2106 10/25/20 0006 10/25/20 0442 10/25/20 0808  BP: (!) 126/52 (!) 121/98 (!) 162/62 (!) 165/66  Pulse: 89 87 86 72  Resp:  16 19 18   Temp: 99.1 F (37.3 C) 98.8 F (37.1 C)  (!) 97.5 F (36.4 C)  TempSrc: Oral Oral  Oral  SpO2:  97% 94% 99%  Weight:      Height:        Wt Readings from Last 3 Encounters:  10/21/20 82.1 kg  07/15/19 83.9 kg  06/29/19 83.6 kg      Intake/Output Summary (Last 24 hours) at 10/25/2020 1050 Last data filed at 10/25/2020 0500 Gross per 24 hour  Intake 120 ml  Output 1950 ml  Net -1830 ml     Physical Exam  Awake mildly confused, No new F.N deficits, Normal affect [place].AT,PERRAL Supple Neck,No JVD, No cervical lymphadenopathy appriciated.  Symmetrical Chest wall movement, Good air movement bilaterally, CTAB RRR,No Gallops, Rubs or new Murmurs, No Parasternal Heave +ve B.Sounds, Abd Soft, No tenderness, No organomegaly appriciated, No rebound - guarding or rigidity. No Cyanosis, both elbows under splint post op,    Data Review:    CBC Recent Labs  Lab 10/21/20 1850 10/22/20 0209 10/24/20 0457 10/25/20 0432  WBC 12.6* 11.6* 12.6* 12.3*  HGB 13.1 11.4* 13.1 11.7*  HCT 40.5 35.8* 40.1 36.7  PLT 202 198 233 224  MCV 88.0 88.4 87.7 87.6  MCH 28.5 28.1 28.7 27.9  MCHC 32.3 31.8 32.7 31.9  RDW 13.8 13.7 13.5 13.8  LYMPHSABS  --  2.8 1.2 1.9  MONOABS  --  1.0 0.8 1.3*  EOSABS  --  0.3 0.0 0.1  BASOSABS  --  0.1 0.0 0.1    Recent Labs  Lab 10/21/20 1850 10/22/20 0209 10/24/20 0457 10/25/20 0432  NA 140 139 137 138  K 4.2 3.7 4.0 3.5  CL 106 104 103 102  CO2 24 25 26 23   GLUCOSE 109* 110* 155* 143*  BUN 18 13 10 13   CREATININE 0.83 0.72 0.63 0.67  CALCIUM 8.7* 8.5* 8.7* 8.4*  AST  --  14* 17 14*  ALT  --  11 12 10   ALKPHOS  --  68 81 69  BILITOT  --  0.8 0.7 0.9  ALBUMIN  --  3.1* 3.1* 2.8*  MG  --  1.9 2.0 2.0  INR  --  1.2  --   --   TSH  --  2.560  --   --   BNP  --   --  330.1* 130.9*    ------------------------------------------------------------------------------------------------------------------ No results for input(s): CHOL, HDL, LDLCALC, TRIG, CHOLHDL, LDLDIRECT in the last 72 hours.  No results found for: HGBA1C ------------------------------------------------------------------------------------------------------------------ No results for input(s): TSH, T4TOTAL,  T3FREE, THYROIDAB in the last 72 hours.  Invalid input(s): FREET3  Cardiac Enzymes No results for input(s): CKMB, TROPONINI, MYOGLOBIN in the last 168 hours.  Invalid input(s): CK ------------------------------------------------------------------------------------------------------------------    Component Value Date/Time   BNP 130.9 (H) 10/25/2020 0432    Micro Results Recent Results (from the past 240 hour(s))  Respiratory Panel by RT PCR (Flu A&B, Covid) - Nasopharyngeal Swab     Status: None   Collection Time: 10/21/20  6:50 PM   Specimen: Nasopharyngeal Swab  Result Value Ref Range Status  SARS Coronavirus 2 by RT PCR NEGATIVE NEGATIVE Final    Comment: (NOTE) SARS-CoV-2 target nucleic acids are NOT DETECTED.  The SARS-CoV-2 RNA is generally detectable in upper respiratoy specimens during the acute phase of infection. The lowest concentration of SARS-CoV-2 viral copies this assay can detect is 131 copies/mL. A negative result does not preclude SARS-Cov-2 infection and should not be used as the sole basis for treatment or other patient management decisions. A negative result may occur with  improper specimen collection/handling, submission of specimen other than nasopharyngeal swab, presence of viral mutation(s) within the areas targeted by this assay, and inadequate number of viral copies (<131 copies/mL). A negative result must be combined with clinical observations, patient history, and epidemiological information. The expected result is Negative.  Fact Sheet for Patients:  https://www.moore.com/  Fact Sheet for Healthcare Providers:  https://www.young.biz/  This test is no t yet approved or cleared by the [place] FDA and  has been authorized for detection and/or diagnosis of SARS-CoV-2 by FDA under an Emergency Use Authorization (EUA). This EUA will remain  in effect (meaning this test can be used) for the duration of  the COVID-19 declaration under Section 564(b)(1) of the Act, 21 U.S.C. section 360bbb-3(b)(1), unless the authorization is terminated or revoked sooner.     Influenza A by PCR NEGATIVE NEGATIVE Final   Influenza B by PCR NEGATIVE NEGATIVE Final    Comment: (NOTE) The Xpert Xpress SARS-CoV-2/FLU/RSV assay is intended as an aid in  the diagnosis of influenza from Nasopharyngeal swab specimens and  should not be used as a sole basis for treatment. Nasal washings and  aspirates are unacceptable for Xpert Xpress SARS-CoV-2/FLU/RSV  testing.  Fact Sheet for Patients: https://www.moore.com/  Fact Sheet for Healthcare Providers: https://www.young.biz/  This test is not yet approved or cleared by the [place] FDA and  has been authorized for detection and/or diagnosis of SARS-CoV-2 by  FDA under an Emergency Use Authorization (EUA). This EUA will remain  in effect (meaning this test can be used) for the duration of the  Covid-19 declaration under Section 564(b)(1) of the Act, 21  U.S.C. section 360bbb-3(b)(1), unless the authorization is  terminated or revoked. Performed at [facility], [address], [place], [place] [address]   Surgical pcr screen     Status: None   Collection Time: 10/22/20 12:04 AM   Specimen: Nasal Mucosa; Nasal Swab  Result Value Ref Range Status   MRSA, PCR NEGATIVE NEGATIVE Final   Staphylococcus aureus NEGATIVE NEGATIVE Final    Comment: (NOTE) The Xpert SA Assay (FDA approved for NASAL specimens in patients 22 years of age and older), is one component of a comprehensive surveillance program. It is not intended to diagnose infection nor to guide or monitor treatment. Performed at [facility], [address], [place], [place] [address]     Radiology Reports DG Elbow 2 Views Left  Result Date: 10/23/2020 CLINICAL DATA:  Postoperative evaluation. EXAM: LEFT ELBOW - 2 VIEW COMPARISON:   October 21, 2020 FINDINGS: The left elbow was imaged in a fiberglass cast with subsequently obscured osseous detail. A radiopaque fixation plate and multiple fixation screws are seen along the dorsal aspect of the olecranon process. There is no evidence of dislocation. Nonspecific prominence of the radial tuberosity is seen. Soft tissues are unremarkable. IMPRESSION: Status post ORIF of the olecranon process. Electronically Signed   By: Aram Candela M.D.   On: 10/23/2020 22:02   DG Elbow  2 Views Left  Result Date: 10/23/2020 CLINICAL DATA:  Surgery.  Left and right elbow ORIF. EXAM: DG C-ARM 1-60 MIN; LEFT ELBOW - 2 VIEW; RIGHT ELBOW - 2 VIEW FLUOROSCOPY TIME:  Fluoroscopy Time: 30 seconds on the left, 37 seconds on the right Radiation Exposure Index (if provided by the fluoroscopic device): Not provided. Number of Acquired Spot Images: 3 if the right, 6 on the left. COMPARISON:  Preoperative imaging 10/21/2020. FINDINGS: Right elbow: Medial and lateral plate and multi screw fixation of comminuted distal humerus fracture. The fracture is in improved alignment compared to preoperative imaging. Left elbow: Plate and screw fixation of displaced olecranon fracture. Fracture is in improved alignment compared to preoperative imaging. IMPRESSION: 1. Procedural fluoroscopy for ORIF of right distal humerus fracture. 2. Procedural fluoroscopy for ORIF of left olecranon fracture. Electronically Signed   By: Narda Rutherford M.D.   On: 10/23/2020 20:55   DG Elbow 2 Views Right  Result Date: 10/23/2020 CLINICAL DATA:  Postoperative evaluation. EXAM: RIGHT ELBOW - 2 VIEW COMPARISON:  None. FINDINGS: Two radiopaque fixation plates and screws are seen along the dorsal aspect of the distal right humerus. The acute transcondylar fracture of the distal right humerus is poorly visualized. There is no evidence of dislocation. Soft tissues are unremarkable. IMPRESSION: Status post open reduction and internal fixation of  the distal right humerus. Electronically Signed   By: Aram Candela M.D.   On: 10/23/2020 22:04   DG Elbow 2 Views Right  Result Date: 10/23/2020 CLINICAL DATA:  Surgery.  Left and right elbow ORIF. EXAM: DG C-ARM 1-60 MIN; LEFT ELBOW - 2 VIEW; RIGHT ELBOW - 2 VIEW FLUOROSCOPY TIME:  Fluoroscopy Time: 30 seconds on the left, 37 seconds on the right Radiation Exposure Index (if provided by the fluoroscopic device): Not provided. Number of Acquired Spot Images: 3 if the right, 6 on the left. COMPARISON:  Preoperative imaging 10/21/2020. FINDINGS: Right elbow: Medial and lateral plate and multi screw fixation of comminuted distal humerus fracture. The fracture is in improved alignment compared to preoperative imaging. Left elbow: Plate and screw fixation of displaced olecranon fracture. Fracture is in improved alignment compared to preoperative imaging. IMPRESSION: 1. Procedural fluoroscopy for ORIF of right distal humerus fracture. 2. Procedural fluoroscopy for ORIF of left olecranon fracture. Electronically Signed   By: Narda Rutherford M.D.   On: 10/23/2020 20:55   DG Elbow Complete Left  Result Date: 10/21/2020 CLINICAL DATA:  Left elbow pain after fall EXAM: LEFT ELBOW - COMPLETE 3+ VIEW COMPARISON:  None. FINDINGS: Acute fracture of the olecranon process of the proximal ulna with 4 mm of fracture distraction. Fracture extends intra-articularly to the ulnotrochlear joint. There is a small elbow joint hemarthrosis. No additional fractures are identified. There is prominence of the radial tuberosity, nonspecific. Degenerative changes of the elbow joint. Enthesopathic changes at the medial and lateral humeral epicondyles. Prominent soft tissue swelling/hematoma overlying the fracture site at the posterior elbow. IMPRESSION: Left elbow: 1. Acute fracture of the olecranon process of the proximal ulna with 4 mm of fracture distraction. 2. Prominent soft tissue swelling/hematoma overlying the fracture site  at the fracture site. Electronically Signed   By: Duanne Guess D.O.   On: 10/21/2020 17:31   DG Elbow Complete Right  Result Date: 10/21/2020 CLINICAL DATA:  Fall, right elbow pain EXAM: RIGHT ELBOW - COMPLETE 3+ VIEW COMPARISON:  None. FINDINGS: Acute transcondylar fracture of the distal right humerus with intra-articular extension. Slight anterior displacement and angulation at the  fracture site. No fractures identified involving the proximal radius or ulna. There is a large elbow joint hemarthrosis. Diffuse soft tissue swelling. IMPRESSION: Right elbow: Acute transcondylar fracture of the distal right humerus with intra-articular extension and slight anterior displacement and angulation. Electronically Signed   By: Duanne Guess D.O.   On: 10/21/2020 17:35   CT Head Wo Contrast  Result Date: 10/21/2020 CLINICAL DATA:  84 year old female with fall and head injury today. EXAM: CT HEAD WITHOUT CONTRAST TECHNIQUE: Contiguous axial images were obtained from the base of the skull through the vertex without intravenous contrast. COMPARISON:  None. FINDINGS: Brain: No evidence of acute infarction, hemorrhage, hydrocephalus, extra-axial collection or mass lesion/mass effect. Moderate periventricular white matter hypodensities are of uncertain chronicity but most likely represent chronic small-vessel white matter ischemic changes. Mild generalized cerebral atrophy is noted. Vascular: Carotid atherosclerotic calcifications are noted. Skull: Normal. Negative for fracture or focal lesion. Sinuses/Orbits: No acute finding. Other: None. IMPRESSION: 1. No evidence of acute intracranial abnormality. 2. Moderate periventricular white matter hypodensities - likely chronic small-vessel white matter ischemic changes. 3. Mild generalized cerebral atrophy. Electronically Signed   By: Harmon Pier M.D.   On: 10/21/2020 17:31   DG C-Arm 1-60 Min  Result Date: 10/23/2020 CLINICAL DATA:  Surgery.  Left and right elbow  ORIF. EXAM: DG C-ARM 1-60 MIN; LEFT ELBOW - 2 VIEW; RIGHT ELBOW - 2 VIEW FLUOROSCOPY TIME:  Fluoroscopy Time: 30 seconds on the left, 37 seconds on the right Radiation Exposure Index (if provided by the fluoroscopic device): Not provided. Number of Acquired Spot Images: 3 if the right, 6 on the left. COMPARISON:  Preoperative imaging 10/21/2020. FINDINGS: Right elbow: Medial and lateral plate and multi screw fixation of comminuted distal humerus fracture. The fracture is in improved alignment compared to preoperative imaging. Left elbow: Plate and screw fixation of displaced olecranon fracture. Fracture is in improved alignment compared to preoperative imaging. IMPRESSION: 1. Procedural fluoroscopy for ORIF of right distal humerus fracture. 2. Procedural fluoroscopy for ORIF of left olecranon fracture. Electronically Signed   By: Narda Rutherford M.D.   On: 10/23/2020 20:55   ECHOCARDIOGRAM COMPLETE  Result Date: 10/22/2020    ECHOCARDIOGRAM REPORT   Patient Name:   Kayla Carrillo Date of Exam: 10/22/2020 Medical Rec #:  [id number]          Height:       63.0 in Accession #:    [id number]         Weight:       181.0 lb Date of Birth:  May 24, 1936          BSA:          1.853 m Patient Age:    84 years           BP:           146/65 mmHg Patient Gender: F                  HR:           75 bpm. Exam Location:  Inpatient Procedure: 2D Echo, Color Doppler and Cardiac Doppler Indications:    Pre-op Evaluation  History:        Patient has prior history of Echocardiogram examinations, most                 recent 06/17/2019. CHF; Risk Factors:Hypertension and                 Dyslipidemia.  Sonographer:  [facility] Senior RDCS Referring Phys: [id number] [place] Lennox IMPRESSIONS  1. Left ventricular ejection fraction, by estimation, is 50 to 55%. The left ventricle has low normal function. The left ventricle has no regional wall motion abnormalities. There is mild concentric left ventricular hypertrophy. Left  ventricular diastolic parameters are consistent with Grade I diastolic dysfunction (impaired relaxation).  2. Right ventricular systolic function is normal. The right ventricular size is normal. Tricuspid regurgitation signal is inadequate for assessing PA pressure.  3. The mitral valve is degenerative. No evidence of mitral valve regurgitation. No evidence of mitral stenosis.  4. The aortic valve is grossly normal. Aortic valve regurgitation is not visualized. No aortic stenosis is present.  5. The inferior vena cava is normal in size with greater than 50% respiratory variability, suggesting right atrial pressure of 3 mmHg. Comparison(s): Prior images reviewed side by side. Changes from prior study are noted. The left ventricular function has improved. FINDINGS  Left Ventricle: Left ventricular ejection fraction, by estimation, is 50 to 55%. The left ventricle has low normal function. The left ventricle has no regional wall motion abnormalities. The left ventricular internal cavity size was normal in size. There is mild concentric left ventricular hypertrophy. Left ventricular diastolic parameters are consistent with Grade I diastolic dysfunction (impaired relaxation). Right Ventricle: The right ventricular size is normal. No increase in right ventricular wall thickness. Right ventricular systolic function is normal. Tricuspid regurgitation signal is inadequate for assessing PA pressure. Left Atrium: Left atrial size was normal in size. Right Atrium: Right atrial size was normal in size. Pericardium: Trivial pericardial effusion is present. Presence of pericardial fat pad. Mitral Valve: The mitral valve is degenerative in appearance. Mild mitral annular calcification. No evidence of mitral valve regurgitation. No evidence of mitral valve stenosis. Tricuspid Valve: The tricuspid valve is grossly normal. Tricuspid valve regurgitation is trivial. No evidence of tricuspid stenosis. Aortic Valve: The aortic valve is  grossly normal. Aortic valve regurgitation is not visualized. No aortic stenosis is present. Pulmonic Valve: The pulmonic valve was grossly normal. Pulmonic valve regurgitation is not visualized. No evidence of pulmonic stenosis. Aorta: The aortic root is normal in size and structure. Venous: The inferior vena cava is normal in size with greater than 50% respiratory variability, suggesting right atrial pressure of 3 mmHg. IAS/Shunts: The atrial septum is grossly normal.  LEFT VENTRICLE PLAX 2D LVIDd:         4.30 cm  Diastology LVIDs:         3.20 cm  LV e' medial:    4.68 cm/s LV PW:         1.30 cm  LV E/e' medial:  16.7 LV IVS:        1.30 cm  LV e' lateral:   5.33 cm/s LVOT diam:     1.90 cm  LV E/e' lateral: 14.6 LV SV:         54 LV SV Index:   29 LVOT Area:     2.84 cm  RIGHT VENTRICLE RV S prime:     11.40 cm/s TAPSE (M-mode): 2.2 cm LEFT ATRIUM             Index       RIGHT ATRIUM           Index LA diam:        3.60 cm 1.94 cm/m  RA Area:     16.80 cm LA Vol (A2C):   46.9 ml 25.31 ml/m RA Volume:   41.20  ml  22.23 ml/m LA Vol (A4C):   65.3 ml 35.23 ml/m LA Biplane Vol: 58.2 ml 31.40 ml/m  AORTIC VALVE LVOT Vmax:   86.20 cm/s LVOT Vmean:  62.000 cm/s LVOT VTI:    0.189 m  AORTA Ao Root diam: 3.00 cm MITRAL VALVE MV Area (PHT): 3.21 cm     SHUNTS MV Decel Time: 236 msec     Systemic VTI:  0.19 m MV E velocity: 78.00 cm/s   Systemic Diam: 1.90 cm MV A velocity: 102.00 cm/s MV E/A ratio:  0.76 Lennie Odor MD Electronically signed by Lennie Odor MD Signature Date/Time: 10/22/2020/1:57:05 PM    Final

## 2020-10-26 DIAGNOSIS — S52022S Displaced fracture of olecranon process without intraarticular extension of left ulna, sequela: Secondary | ICD-10-CM | POA: Diagnosis not present

## 2020-10-26 DIAGNOSIS — S42471S Displaced transcondylar fracture of right humerus, sequela: Secondary | ICD-10-CM | POA: Diagnosis not present

## 2020-10-26 DIAGNOSIS — R4189 Other symptoms and signs involving cognitive functions and awareness: Secondary | ICD-10-CM | POA: Diagnosis not present

## 2020-10-26 DIAGNOSIS — D72829 Elevated white blood cell count, unspecified: Secondary | ICD-10-CM | POA: Diagnosis not present

## 2020-10-26 DIAGNOSIS — W19XXXA Unspecified fall, initial encounter: Secondary | ICD-10-CM | POA: Diagnosis not present

## 2020-10-26 DIAGNOSIS — N3281 Overactive bladder: Secondary | ICD-10-CM | POA: Diagnosis not present

## 2020-10-26 DIAGNOSIS — R29818 Other symptoms and signs involving the nervous system: Secondary | ICD-10-CM | POA: Diagnosis not present

## 2020-10-26 DIAGNOSIS — F4321 Adjustment disorder with depressed mood: Secondary | ICD-10-CM | POA: Diagnosis not present

## 2020-10-26 DIAGNOSIS — I5022 Chronic systolic (congestive) heart failure: Secondary | ICD-10-CM | POA: Diagnosis not present

## 2020-10-26 DIAGNOSIS — I959 Hypotension, unspecified: Secondary | ICD-10-CM | POA: Diagnosis not present

## 2020-10-26 DIAGNOSIS — I420 Dilated cardiomyopathy: Secondary | ICD-10-CM | POA: Diagnosis not present

## 2020-10-26 DIAGNOSIS — Z9181 History of falling: Secondary | ICD-10-CM | POA: Diagnosis not present

## 2020-10-26 DIAGNOSIS — R41841 Cognitive communication deficit: Secondary | ICD-10-CM | POA: Diagnosis not present

## 2020-10-26 DIAGNOSIS — Y92009 Unspecified place in unspecified non-institutional (private) residence as the place of occurrence of the external cause: Secondary | ICD-10-CM | POA: Diagnosis not present

## 2020-10-26 DIAGNOSIS — H353211 Exudative age-related macular degeneration, right eye, with active choroidal neovascularization: Secondary | ICD-10-CM | POA: Diagnosis not present

## 2020-10-26 DIAGNOSIS — F4323 Adjustment disorder with mixed anxiety and depressed mood: Secondary | ICD-10-CM | POA: Diagnosis not present

## 2020-10-26 DIAGNOSIS — N390 Urinary tract infection, site not specified: Secondary | ICD-10-CM | POA: Diagnosis not present

## 2020-10-26 DIAGNOSIS — Z7401 Bed confinement status: Secondary | ICD-10-CM | POA: Diagnosis not present

## 2020-10-26 DIAGNOSIS — E782 Mixed hyperlipidemia: Secondary | ICD-10-CM | POA: Diagnosis not present

## 2020-10-26 DIAGNOSIS — I517 Cardiomegaly: Secondary | ICD-10-CM | POA: Diagnosis not present

## 2020-10-26 DIAGNOSIS — S52022D Displaced fracture of olecranon process without intraarticular extension of left ulna, subsequent encounter for closed fracture with routine healing: Secondary | ICD-10-CM | POA: Diagnosis not present

## 2020-10-26 DIAGNOSIS — S42471D Displaced transcondylar fracture of right humerus, subsequent encounter for fracture with routine healing: Secondary | ICD-10-CM | POA: Diagnosis not present

## 2020-10-26 DIAGNOSIS — Z8679 Personal history of other diseases of the circulatory system: Secondary | ICD-10-CM | POA: Diagnosis not present

## 2020-10-26 DIAGNOSIS — F339 Major depressive disorder, recurrent, unspecified: Secondary | ICD-10-CM | POA: Diagnosis not present

## 2020-10-26 DIAGNOSIS — U071 COVID-19: Secondary | ICD-10-CM | POA: Diagnosis not present

## 2020-10-26 DIAGNOSIS — I10 Essential (primary) hypertension: Secondary | ICD-10-CM | POA: Diagnosis not present

## 2020-10-26 DIAGNOSIS — S52022A Displaced fracture of olecranon process without intraarticular extension of left ulna, initial encounter for closed fracture: Secondary | ICD-10-CM | POA: Diagnosis not present

## 2020-10-26 DIAGNOSIS — M6281 Muscle weakness (generalized): Secondary | ICD-10-CM | POA: Diagnosis not present

## 2020-10-26 DIAGNOSIS — R531 Weakness: Secondary | ICD-10-CM | POA: Diagnosis not present

## 2020-10-26 DIAGNOSIS — M2550 Pain in unspecified joint: Secondary | ICD-10-CM | POA: Diagnosis not present

## 2020-10-26 LAB — COMPREHENSIVE METABOLIC PANEL
ALT: 12 U/L (ref 0–44)
AST: 19 U/L (ref 15–41)
Albumin: 2.7 g/dL — ABNORMAL LOW (ref 3.5–5.0)
Alkaline Phosphatase: 63 U/L (ref 38–126)
Anion gap: 7 (ref 5–15)
BUN: 16 mg/dL (ref 8–23)
CO2: 30 mmol/L (ref 22–32)
Calcium: 9 mg/dL (ref 8.9–10.3)
Chloride: 102 mmol/L (ref 98–111)
Creatinine, Ser: 0.67 mg/dL (ref 0.44–1.00)
GFR, Estimated: >60 mL/min (ref >60)
Glucose, Bld: 138 mg/dL — ABNORMAL HIGH (ref 70–99)
Potassium: 4 mmol/L (ref 3.5–5.1)
Sodium: 139 mmol/L (ref 135–145)
Total Bilirubin: 0.6 mg/dL (ref 0.3–1.2)
Total Protein: 6.2 g/dL — ABNORMAL LOW (ref 6.5–8.1)

## 2020-10-26 LAB — CBC WITH DIFFERENTIAL/PLATELET
Abs Immature Granulocytes: 0.04 10*3/uL (ref 0.00–0.07)
Basophils Absolute: 0.1 10*3/uL (ref 0.0–0.1)
Basophils Relative: 1 %
Eosinophils Absolute: 0.3 10*3/uL (ref 0.0–0.5)
Eosinophils Relative: 2 %
HCT: 37.6 % (ref 36.0–46.0)
Hemoglobin: 11.8 g/dL — ABNORMAL LOW (ref 12.0–15.0)
Immature Granulocytes: 0 %
Lymphocytes Relative: 24 %
Lymphs Abs: 2.8 10*3/uL (ref 0.7–4.0)
MCH: 27.9 pg (ref 26.0–34.0)
MCHC: 31.4 g/dL (ref 30.0–36.0)
MCV: 88.9 fL (ref 80.0–100.0)
Monocytes Absolute: 1.2 10*3/uL — ABNORMAL HIGH (ref 0.1–1.0)
Monocytes Relative: 10 %
Neutro Abs: 7.6 10*3/uL (ref 1.7–7.7)
Neutrophils Relative %: 63 %
Platelets: 215 10*3/uL (ref 150–400)
RBC: 4.23 MIL/uL (ref 3.87–5.11)
RDW: 13.8 % (ref 11.5–15.5)
WBC: 12.1 10*3/uL — ABNORMAL HIGH (ref 4.0–10.5)
nRBC: 0 % (ref 0.0–0.2)

## 2020-10-26 LAB — BRAIN NATRIURETIC PEPTIDE: B Natriuretic Peptide: 62.4 pg/mL (ref 0.0–100.0)

## 2020-10-26 LAB — MAGNESIUM: Magnesium: 2.1 mg/dL (ref 1.7–2.4)

## 2020-10-26 MED ORDER — HYDRALAZINE HCL 50 MG PO TABS
50.0000 mg | ORAL_TABLET | Freq: Three times a day (TID) | ORAL | Status: DC
Start: 2020-10-26 — End: 2020-12-12

## 2020-10-26 MED ORDER — CALCITRIOL 0.5 MCG PO CAPS
0.5000 ug | ORAL_CAPSULE | Freq: Every day | ORAL | Status: DC
Start: 2020-10-26 — End: 2020-12-20

## 2020-10-26 MED ORDER — HYDROCODONE-ACETAMINOPHEN 5-325 MG PO TABS: 1.0000 | ORAL_TABLET | Freq: Three times a day (TID) | ORAL | 0 refills | Status: DC | PRN

## 2020-10-26 MED ORDER — VITAMIN D3 25 MCG PO TABS: 2000.0000 [IU] | ORAL_TABLET | Freq: Every day | ORAL | Status: AC

## 2020-10-26 MED ORDER — ACETAMINOPHEN 500 MG PO TABS: 500.0000 mg | ORAL_TABLET | Freq: Four times a day (QID) | ORAL | 2 refills | Status: DC | PRN

## 2020-10-26 MED ORDER — BISACODYL 5 MG PO TBEC
5.0000 mg | DELAYED_RELEASE_TABLET | Freq: Every day | ORAL | 0 refills | Status: DC | PRN
Start: 2020-10-26 — End: 2021-01-30

## 2020-10-26 MED ORDER — DOCUSATE SODIUM 100 MG PO CAPS
100.0000 mg | ORAL_CAPSULE | Freq: Two times a day (BID) | ORAL | 0 refills | Status: DC
Start: 2020-10-26 — End: 2021-01-30

## 2020-10-26 NOTE — Progress Notes (Signed)
Report attempt to [facility] facility was unsuccessful. Will try back in a few minutes,

## 2020-10-26 NOTE — Plan of Care (Signed)
  Problem: Education: Goal: Knowledge of General Education information will improve Description: Including pain rating scale, medication(s)/side effects and non-pharmacologic comfort measures Outcome: Adequate for Discharge   Problem: Clinical Measurements: Goal: Ability to maintain clinical measurements within normal limits will improve Outcome: Adequate for Discharge Goal: Will remain free from infection Outcome: Adequate for Discharge Goal: Diagnostic test results will improve Outcome: Adequate for Discharge Goal: Respiratory complications will improve Outcome: Adequate for Discharge Goal: Cardiovascular complication will be avoided Outcome: Adequate for Discharge   Problem: Activity: Goal: Risk for activity intolerance will decrease Outcome: Adequate for Discharge   Problem: Nutrition: Goal: Adequate nutrition will be maintained Outcome: Adequate for Discharge   Problem: Coping: Goal: Level of anxiety will decrease Outcome: Adequate for Discharge   Problem: Safety: Goal: Ability to remain free from injury will improve Outcome: Adequate for Discharge   Problem: Skin Integrity: Goal: Risk for impaired skin integrity will decrease Outcome: Adequate for Discharge

## 2020-10-26 NOTE — TOC Progression Note (Signed)
Transition of Care [facility]) - Progression Note    Patient Details  Name: Kayla Carrillo MRN: [id number] Date of Birth: March 24, 1936  [facility]) CM/SW Contact  Kermit Balo, RN Phone Number: 10/26/2020, 8:22 AM  Clinical Narrative:    Pts daughter selected [facility] for SNF rehab. Pt in agreement. Kitty with [facility] made aware and they should have a bed once insurance has approved. CM started insurance auth for SNF rehab and ambulance transport via [facility].  TOC following.   Expected Discharge Plan: Skilled Nursing Facility Barriers to Discharge: Continued Medical Work up  Expected Discharge Plan and Services Expected Discharge Plan: Skilled Nursing Facility In-house Referral: Clinical Social Work Discharge Planning Services: CM Consult Post Acute Care Choice: Skilled Nursing Facility Living arrangements for the past 2 months: Apartment, Independent Living Facility                                       Social Determinants of Health (SDOH) Interventions    Readmission Risk Interventions No flowsheet data found.

## 2020-10-26 NOTE — Care Management Important Message (Signed)
Important Message  Patient Details  Name: Kayla Carrillo MRN: [id number] Date of Birth: [birth year]   Medicare Important Message Given:  Yes     Eurika Sandy Stefan Church 10/26/2020, 9:46 AM

## 2020-10-26 NOTE — Progress Notes (Signed)
Occupational Therapy Treatment Patient Details Name: Kayla Carrillo MRN: [id number] DOB: [birth year] Today's Date: 10/26/2020    History of present illness Patient is a 84 y/o female who presents with Bil UE fxs s/p fall and now s/p ORIF right humerus and ORIF left olecranon fx 10/23/20. PMH includes cardiomyopathy, depression witih anxiety, dyslipidemia.   OT comments  Pt positioned with BUE in dependent position with significant edema R hand. Using LUE to assist with call bell usage. Pt with significant complaint of pain R lateral aspect of wrist. Pt with rough edge of splint causing discomfort and potential for skin breakdown. Nsg notified. Ortho tech called and came to room. Ortho tech is to remake B long arm splints to correctly position B wrists to reduce risk of skin breakdown. Educated pt and nsg on importance of keeping BUE elevated to reduce BUE edema. Encouraged pt to move fingers frequently. Anticipate DC to snf today.  Follow Up Recommendations  SNF    Equipment Recommendations  3 in 1 bedside commode    Recommendations for Other Services      Precautions / Restrictions Precautions Precautions: Fall;Other (comment) Precaution Comments: watch 02 Required Braces or Orthoses: Splint/Cast Splint/Cast: B LONG ARM SPLINTS ENDING ABOVE WRISTS Restrictions Weight Bearing Restrictions: Yes RUE Weight Bearing: Non weight bearing LUE Weight Bearing: Non weight bearing       Mobility Bed Mobility                  Transfers                      Balance                                           ADL either performed or assessed with clinical judgement   ADL                                         General ADL Comments: total A with ADL tasks; Educated on use of soft touch call bell     Vision       Perception     Praxis      Cognition Arousal/Alertness: Awake/alert Behavior During Therapy: WFL for tasks  assessed/performed Overall Cognitive Status: No family/caregiver present to determine baseline cognitive functioning                                          Exercises Other Exercises Other Exercises: AROM bil hands elevated to decr edema Other Exercises: educated on edema management adn B UE elevated   Shoulder Instructions       General Comments In prior session, pt complaining of Pain at wrist sites of cast R wrist worse than L.Pt developing what appears to be skin breakdown R lateral aspect of wrist and is very painful to touch. Moderate edema due to pt positioning in dependent edema. Unable to make full composite fist and has very limited ROM R hand; unable to oppose thumb to digits; not using functionally; Using L hand to operate call bell and moving shoulder freely; limited R shoulder flexion to @ 45 degrees    Pertinent Vitals/ Pain  Pain Assessment: Faces Faces Pain Scale: Hurts even more Pain Location: R wirst Pain Descriptors / Indicators: Grimacing;Guarding;Moaning;Constant Pain Intervention(s): Monitored during session;Repositioned  Home Living                                          Prior Functioning/Environment              Frequency  Min 3X/week        Progress Toward Goals  OT Goals(current goals can now be found in the care plan section)  Progress towards OT goals: Progressing toward goals  Acute Rehab OT Goals Patient Stated Goal: get therapy to get stronger OT Goal Formulation: With patient/family Time For Goal Achievement: 11/07/20 Potential to Achieve Goals: Good ADL Goals Pt Will Transfer to Toilet: with mod assist;ambulating;bedside commode Additional ADL Goal #1: pt will complete bed mobiltiy min (A) as precursor to adls with hob 30 degrees Additional ADL Goal #2: Pt will direct caregivers regarding management of BUE positioining to decrease dependent edema  Plan Discharge plan remains appropriate     Co-evaluation                 AM-PAC OT "6 Clicks" Daily Activity     Outcome Measure   Help from another person eating meals?: Total Help from another person taking care of personal grooming?: Total Help from another person toileting, which includes using toliet, bedpan, or urinal?: Total Help from another person bathing (including washing, rinsing, drying)?: Total Help from another person to put on and taking off regular upper body clothing?: Total Help from another person to put on and taking off regular lower body clothing?: Total 6 Click Score: 6    End of Session Equipment Utilized During Treatment: Oxygen  OT Visit Diagnosis: Unsteadiness on feet (R26.81);Muscle weakness (generalized) (M62.81);Pain Pain - Right/Left: Right Pain - part of body: Arm (wrist)   Activity Tolerance Patient tolerated treatment well   Patient Left in bed;with call bell/phone within reach   Nurse Communication Precautions;Other (comment) (need for ortho tech to address splint issues/skin breakdown)        Time: 3664-4034 OT Time Calculation (min): 20 min  Charges: OT General Charges $OT Visit: 1 Visit OT Treatments $Therapeutic Activity: 8-22 mins  Luisa Dago, OT/L   Acute OT Clinical Specialist Acute Rehabilitation Services Pager [phone] Office [phone]    [facility] 10/26/2020, 12:58 PM

## 2020-10-26 NOTE — Discharge Instructions (Signed)
Follow with Primary MD Koirala, Dibas, MD in 7 days   Get CBC, CMP  checked next visit within 1 week by Primary MD or SNF MD   Activity: As tolerated with Full fall precautions use walker/cane & assistance as needed, upper extremity restrictions will be specified by Ortho Dr Carola Frost  Disposition SNF  Diet: Heart Healthy soft with feeding assistance and aspiration precautions.    Special Instructions: If you have smoked or chewed Tobacco  in the last 2 yrs please stop smoking, stop any regular Alcohol  and or any Recreational drug use.  On your next visit with your primary care physician please Get Medicines reviewed and adjusted.  Please request your Prim.MD to go over all Hospital Tests and Procedure/Radiological results at the follow up, please get all Hospital records sent to your Prim MD by signing hospital release before you go home.  If you experience worsening of your admission symptoms, develop shortness of breath, life threatening emergency, suicidal or homicidal thoughts you must seek medical attention immediately by calling 911 or calling your MD immediately  if symptoms less severe.  You Must read complete instructions/literature along with all the possible adverse reactions/side effects for all the Medicines you take and that have been prescribed to you. Take any new Medicines after you have completely understood and accpet all the possible adverse reactions/side effects.     Orthopaedic Trauma Service Discharge Instructions   General Discharge Instructions  Orthopaedic Injuries:  Right distal humerus fracture (elbow) treated with open reduction and internal fixation using plates and screws             Left olecranon fracture (elbow) treated with open reduction and internal fixation using plate and screws   WEIGHT BEARING STATUS: nonweightbearing Bilateral upper extremities, no lifting with Both arms   RANGE OF MOTION/ACTIVITY: ok to move fingers and wrists Bilateral  upper extremities  Bone health:  Labs show some vitamin d insufficiency. Continue with vitamin d supplementation   Wound Care: DO NOT REMOVE SPLINTS ON EITHER ARM. Keep splints clean and dry. We will remove splints in 1 week at the office    Diet: as you were eating previously.  Can use over the counter stool softeners and bowel preparations, such as Miralax, to help with bowel movements.  Narcotics can be constipating.  Be sure to drink plenty of fluids  PAIN MEDICATION USE AND EXPECTATIONS  You have likely been given narcotic medications to help control your pain.  After a traumatic event that results in an fracture (broken bone) with or without surgery, it is ok to use narcotic pain medications to help control one's pain.  We understand that everyone responds to pain differently and each individual patient will be evaluated on a regular basis for the continued need for narcotic medications. Ideally, narcotic medication use should last no more than 6-8 weeks (coinciding with fracture healing).   As a patient it is your responsibility as well to monitor narcotic medication use and report the amount and frequency you use these medications when you come to your office visit.   We would also advise that if you are using narcotic medications, you should take a dose prior to therapy to maximize you participation.  IF YOU ARE ON NARCOTIC MEDICATIONS IT IS NOT PERMISSIBLE TO OPERATE A MOTOR VEHICLE (MOTORCYCLE/CAR/TRUCK/MOPED) OR HEAVY MACHINERY DO NOT MIX NARCOTICS WITH OTHER CNS (CENTRAL NERVOUS SYSTEM) DEPRESSANTS SUCH AS ALCOHOL   STOP SMOKING OR USING NICOTINE PRODUCTS!!!!  As discussed nicotine  severely impairs your body's ability to heal surgical and traumatic wounds but also impairs bone healing.  Wounds and bone heal by forming microscopic blood vessels (angiogenesis) and nicotine is a vasoconstrictor (essentially, shrinks blood vessels).  Therefore, if vasoconstriction occurs to these  microscopic blood vessels they essentially disappear and are unable to deliver necessary nutrients to the healing tissue.  This is one modifiable factor that you can do to dramatically increase your chances of healing your injury.    (This means no smoking, no nicotine gum, patches, etc)  DO NOT USE NONSTEROIDAL ANTI-INFLAMMATORY DRUGS (NSAID'S)  Using products such as Advil (ibuprofen), Aleve (naproxen), Motrin (ibuprofen) for additional pain control during fracture healing can delay and/or prevent the healing response.  If you would like to take over the counter (OTC) medication, Tylenol (acetaminophen) is ok.  However, some narcotic medications that are given for pain control contain acetaminophen as well. Therefore, you should not exceed more than 4000 mg of tylenol in a day if you do not have liver disease.  Also note that there are may OTC medicines, such as cold medicines and allergy medicines that my contain tylenol as well.  If you have any questions about medications and/or interactions please ask your doctor/PA or your pharmacist.      ICE AND ELEVATE INJURED/OPERATIVE EXTREMITY  Using ice and elevating the injured extremity above your heart can help with swelling and pain control.  Icing in a pulsatile fashion, such as 20 minutes on and 20 minutes off, can be followed.    Do not place ice directly on skin. Make sure there is a barrier between to skin and the ice pack.    Using frozen items such as frozen peas works well as the conform nicely to the are that needs to be iced.  USE AN ACE WRAP OR TED HOSE FOR SWELLING CONTROL  In addition to icing and elevation, Ace wraps or TED hose are used to help limit and resolve swelling.  It is recommended to use Ace wraps or TED hose until you are informed to stop.    When using Ace Wraps start the wrapping distally (farthest away from the body) and wrap proximally (closer to the body)   Example: If you had surgery on your leg or thing and you do not  have a splint on, start the ace wrap at the toes and work your way up to the thigh        If you had surgery on your upper extremity and do not have a splint on, start the ace wrap at your fingers and work your way up to the upper arm  IF YOU ARE IN A SPLINT OR CAST DO NOT REMOVE IT FOR ANY REASON   If your splint gets wet for any reason please contact the office immediately. You may shower in your splint or cast as long as you keep it dry.  This can be done by wrapping in a cast cover or garbage back (or similar)  Do Not stick any thing down your splint or cast such as pencils, money, or hangers to try and scratch yourself with.  If you feel itchy take benadryl as prescribed on the bottle for itching  IF YOU ARE IN A CAM BOOT (BLACK BOOT)  You may remove boot periodically. Perform daily dressing changes as noted below.  Wash the liner of the boot regularly and wear a sock when wearing the boot. It is recommended that you sleep in the  boot until told otherwise    Call office for the following:  Temperature greater than 101F  Persistent nausea and vomiting  Severe uncontrolled pain  Redness, tenderness, or signs of infection (pain, swelling, redness, odor or green/yellow discharge around the site)  Difficulty breathing, headache or visual disturbances  Hives  Persistent dizziness or light-headedness  Extreme fatigue  Any other questions or concerns you may have after discharge  In an emergency, call 911 or go to an Emergency Department at a nearby hospital    CALL THE OFFICE WITH ANY QUESTIONS OR CONCERNS: [phone]   VISIT OUR WEBSITE FOR ADDITIONAL INFORMATION: [email or web address]

## 2020-10-26 NOTE — TOC Transition Note (Signed)
Transition of Care [facility]) - CM/SW Discharge Note   Patient Details  Name: Kooper Chriswell MRN: [id number] Date of Birth: [birth year]  [facility]) CM/SW Contact:  Kermit Balo, RN Phone Number: 10/26/2020, 11:44 AM   Clinical Narrative:    Pt discharging to [facility] SNF today. CM has updated the bedside RN and daughter. Pt will transport via [facility]. Which was approved through her HTA: [phone]  Room: 112 Number for report [phone]   Final next level of care: Skilled Nursing Facility Barriers to Discharge: No Barriers Identified   Patient Goals and CMS Choice   CMS Medicare.gov Compare Post Acute Care list provided to:: Patient Represenative (must comment) Choice offered to / list presented to : Patient, Adult Children  Discharge Placement PASRR number recieved: 10/24/20            Patient chooses bed at: [facility] Patient to be transferred to facility by: [facility] Name of family member notified: Joann Patient and family notified of of transfer: 10/26/20  Discharge Plan and Services In-house Referral: Clinical Social Work Discharge Planning Services: [employer] Consult Post Acute Care Choice: Skilled Nursing Facility                               Social Determinants of Health (SDOH) Interventions     Readmission Risk Interventions No flowsheet data found.

## 2020-10-26 NOTE — Discharge Summary (Signed)
Amilah Greenspan KGM:[id number] DOB: 1936/12/29 DOA: 10/21/2020  PCP: [name], MD  Admit date: 10/21/2020  Discharge date: 10/26/2020  Admitted From: Home  Disposition:  SNF   Recommendations for Outpatient Follow-up:   Follow up with PCP in 1-2 weeks  PCP Please obtain BMP/CBC, 2 view CXR in 1week,  (see Discharge instructions)   PCP Please follow up on the following pending results:    Home Health: None   Equipment/Devices: None  Consultations: Ortho Discharge Condition: Stable    CODE STATUS: Full    Diet Recommendation: Heart Healthy   Diet Order            Diet - low sodium heart healthy           Diet Heart Room service appropriate? Yes; Fluid consistency: Thin  Diet effective now                  Chief Complaint  Patient presents with   Fall     Brief history of present illness from the day of admission and additional interim summary    84 year old female with past medical history of hypertension, Takotsubo cardiomyopathy, gastroesophageal reflux disease, hyperlipidemia who presents to [facility] emergency department via EMS after patient fell injuring her arms, in the ER her work-up showed olecranon process fracture of the left elbow as well as a transcondylar fracture of the distal right humerus, orthopedics was consulted and hospitalist were requested to admit the patient.                                                                 Hospital Course    1. Mechanical fall with bilateral elbow fractures - olecranon process fracture of the left elbow as well as a transcondylar fracture of the distal right humerus - seen by ortho and underwent -   OPEN REDUCTION INTERNAL FIXATION (ORIF)RIGHT TRANSCONDYLAR DISTAL HUMERUS FRACTURE RIGHT ULNAR NERVE NEUROPLASTY OPEN  REDUCTION INTERNAL FIXATION LEFT DISTAL HUMERUS FRACTURE (Left) OLECRANON FRACTURE  By Alvy BealMyrene Galas, MD on 10/23/20 -   Tolerated procedure well, she is able to move her fingers in both hands,  H&H stable, PT OT, bilateral upper extremity range of motion and weightbearing per orthopedics Dr. Carola Frost.   Will be discharged to SNF.   2.  Recent history of Takotsubo cardiomyopathy with left bundle branch block.  Currently appears compensated, good exercise tolerance at baseline able to climb a flight of stairs without any chest discomfort, continue Coreg, statin along with hydralazine.  Repeat echocardiogram shows improvement in EF to 50% now without any wall motion abnormality, symptom-free, cardiology on board she follows with Dr. Rollene Rotunda.  3.  Essential hypertension.   Was on Coreg, hydralazine added for better control. Kindly  monitor and adjust as needed at  SNF.   4.  Stress related leukocytosis.  Asymptomatic, stable random bladder scan on 10/24/20.  5. Dyslipidemia.  On statin.  6. Mild hospital-acquired delirium, stable, minimize narcotics, expose to sunlight, supportive care. Avoid benzodiazepines.    Discharge diagnosis     Principal Problem:   Fall at home, initial encounter Active Problems:   Closed olecranon fracture, left, initial encounter   Closed displaced transcondylar fracture of right humerus   Essential hypertension   Mixed hyperlipidemia   Leukocytosis   Chronic systolic heart failure (HCC)    Discharge instructions    Discharge Instructions    Diet - low sodium heart healthy   Complete by: As directed    Discharge instructions   Complete by: As directed    Follow with Primary MD Koirala, Dibas, MD in 7 days   Get CBC, CMP  checked next visit within 1 week by Primary MD or SNF MD   Activity: As tolerated with Full fall precautions use walker/cane & assistance as needed, upper extremity restrictions will be specified by Ortho Dr  Carola Frost  Disposition SNF  Diet: Heart Healthy soft with feeding assistance and aspiration precautions.    Special Instructions: If you have smoked or chewed Tobacco  in the last 2 yrs please stop smoking, stop any regular Alcohol  and or any Recreational drug use.  On your next visit with your primary care physician please Get Medicines reviewed and adjusted.  Please request your Prim.MD to go over all Hospital Tests and Procedure/Radiological results at the follow up, please get all Hospital records sent to your Prim MD by signing hospital release before you go home.  If you experience worsening of your admission symptoms, develop shortness of breath, life threatening emergency, suicidal or homicidal thoughts you must seek medical attention immediately by calling 911 or calling your MD immediately  if symptoms less severe.  You Must read complete instructions/literature along with all the possible adverse reactions/side effects for all the Medicines you take and that have been prescribed to you. Take any new Medicines after you have completely understood and accpet all the possible adverse reactions/side effects.   No wound care   Complete by: As directed       Discharge Medications   Allergies as of 10/26/2020      Reactions   Codeine Sulfate [codeine] Other (See Comments)   drowsey   Sulfa Antibiotics Other (See Comments)   drowsey      Medication List    TAKE these medications   acetaminophen 500 MG tablet Commonly known as: TYLENOL Take 1 tablet (500 mg total) by mouth every 6 (six) hours as needed for moderate pain.   bisacodyl 5 MG EC tablet Commonly known as: DULCOLAX Take 1 tablet (5 mg total) by mouth daily as needed for moderate constipation.   calcitRIOL 0.5 MCG capsule Commonly known as: ROCALTROL Take 1 capsule (0.5 mcg total) by mouth daily.   carvedilol 6.25 MG tablet Commonly known as: COREG Take 6.25 mg by mouth 2 (two) times daily with a meal.    docusate sodium 100 MG capsule Commonly known as: COLACE Take 1 capsule (100 mg total) by mouth 2 (two) times daily.   hydrALAZINE 50 MG tablet Commonly known as: APRESOLINE Take 1 tablet (50 mg total) by mouth every 8 (eight) hours.   HYDROcodone-acetaminophen 5-325 MG tablet Commonly known as: NORCO/VICODIN Take 1 tablet by mouth every 8 (eight) hours as needed for severe pain.   nitrofurantoin 100 MG capsule  Commonly known as: MACRODANTIN Take 100 mg by mouth at bedtime.   omeprazole 20 MG capsule Commonly known as: PRILOSEC Take 20 mg by mouth daily.   oxybutynin 10 MG 24 hr tablet Commonly known as: DITROPAN-XL Take 10 mg by mouth daily.   sertraline 50 MG tablet Commonly known as: ZOLOFT Take 50 mg by mouth daily.   simvastatin 40 MG tablet Commonly known as: ZOCOR Take 40 mg by mouth daily.   Vitamin D3 25 MCG tablet Commonly known as: Vitamin D Take 2 tablets (2,000 Units total) by mouth daily.        Follow-up Information    Koirala, Dibas, MD. Schedule an appointment as soon as possible for a visit in 1 week(s).   Specialty: Family Medicine Contact information: [address] [place] [address] [phone]        Rollene Rotunda, MD .   Specialty: Cardiology Contact information: [address] [place] [address] [phone]        Myrene Galas, MD. Schedule an appointment as soon as possible for a visit on 11/08/2020.   Specialty: Orthopedic Surgery Why: right distal humerus fracture and left olecranon fracture.  Call for appointment time  Contact information: [address] [place] [address] [phone]               Major procedures and Radiology Reports - PLEASE review detailed and final reports thoroughly  -        DG Elbow 2 Views Left  Result Date: 10/23/2020 CLINICAL DATA:  Postoperative evaluation. EXAM: LEFT ELBOW - 2 VIEW COMPARISON:  October 21, 2020 FINDINGS: The  left elbow was imaged in a fiberglass cast with subsequently obscured osseous detail. A radiopaque fixation plate and multiple fixation screws are seen along the dorsal aspect of the olecranon process. There is no evidence of dislocation. Nonspecific prominence of the radial tuberosity is seen. Soft tissues are unremarkable. IMPRESSION: Status post ORIF of the olecranon process. Electronically Signed   By: Aram Candela M.D.   On: 10/23/2020 22:02   DG Elbow 2 Views Left  Result Date: 10/23/2020 CLINICAL DATA:  Surgery.  Left and right elbow ORIF. EXAM: DG C-ARM 1-60 MIN; LEFT ELBOW - 2 VIEW; RIGHT ELBOW - 2 VIEW FLUOROSCOPY TIME:  Fluoroscopy Time: 30 seconds on the left, 37 seconds on the right Radiation Exposure Index (if provided by the fluoroscopic device): Not provided. Number of Acquired Spot Images: 3 if the right, 6 on the left. COMPARISON:  Preoperative imaging 10/21/2020. FINDINGS: Right elbow: Medial and lateral plate and multi screw fixation of comminuted distal humerus fracture. The fracture is in improved alignment compared to preoperative imaging. Left elbow: Plate and screw fixation of displaced olecranon fracture. Fracture is in improved alignment compared to preoperative imaging. IMPRESSION: 1. Procedural fluoroscopy for ORIF of right distal humerus fracture. 2. Procedural fluoroscopy for ORIF of left olecranon fracture. Electronically Signed   By: Narda Rutherford M.D.   On: 10/23/2020 20:55   DG Elbow 2 Views Right  Result Date: 10/23/2020 CLINICAL DATA:  Postoperative evaluation. EXAM: RIGHT ELBOW - 2 VIEW COMPARISON:  None. FINDINGS: Two radiopaque fixation plates and screws are seen along the dorsal aspect of the distal right humerus. The acute transcondylar fracture of the distal right humerus is poorly visualized. There is no evidence of dislocation. Soft tissues are unremarkable. IMPRESSION: Status post open reduction and internal fixation of the distal right humerus.  Electronically Signed   By: Waylan Rocher  Houston M.D.   On: 10/23/2020 22:04   DG Elbow 2 Views Right  Result Date: 10/23/2020 CLINICAL DATA:  Surgery.  Left and right elbow ORIF. EXAM: DG C-ARM 1-60 MIN; LEFT ELBOW - 2 VIEW; RIGHT ELBOW - 2 VIEW FLUOROSCOPY TIME:  Fluoroscopy Time: 30 seconds on the left, 37 seconds on the right Radiation Exposure Index (if provided by the fluoroscopic device): Not provided. Number of Acquired Spot Images: 3 if the right, 6 on the left. COMPARISON:  Preoperative imaging 10/21/2020. FINDINGS: Right elbow: Medial and lateral plate and multi screw fixation of comminuted distal humerus fracture. The fracture is in improved alignment compared to preoperative imaging. Left elbow: Plate and screw fixation of displaced olecranon fracture. Fracture is in improved alignment compared to preoperative imaging. IMPRESSION: 1. Procedural fluoroscopy for ORIF of right distal humerus fracture. 2. Procedural fluoroscopy for ORIF of left olecranon fracture. Electronically Signed   By: Narda Rutherford M.D.   On: 10/23/2020 20:55   DG Elbow Complete Left  Result Date: 10/21/2020 CLINICAL DATA:  Left elbow pain after fall EXAM: LEFT ELBOW - COMPLETE 3+ VIEW COMPARISON:  None. FINDINGS: Acute fracture of the olecranon process of the proximal ulna with 4 mm of fracture distraction. Fracture extends intra-articularly to the ulnotrochlear joint. There is a small elbow joint hemarthrosis. No additional fractures are identified. There is prominence of the radial tuberosity, nonspecific. Degenerative changes of the elbow joint. Enthesopathic changes at the medial and lateral humeral epicondyles. Prominent soft tissue swelling/hematoma overlying the fracture site at the posterior elbow. IMPRESSION: Left elbow: 1. Acute fracture of the olecranon process of the proximal ulna with 4 mm of fracture distraction. 2. Prominent soft tissue swelling/hematoma overlying the fracture site at the fracture site.  Electronically Signed   By: Duanne Guess D.O.   On: 10/21/2020 17:31   DG Elbow Complete Right  Result Date: 10/21/2020 CLINICAL DATA:  Fall, right elbow pain EXAM: RIGHT ELBOW - COMPLETE 3+ VIEW COMPARISON:  None. FINDINGS: Acute transcondylar fracture of the distal right humerus with intra-articular extension. Slight anterior displacement and angulation at the fracture site. No fractures identified involving the proximal radius or ulna. There is a large elbow joint hemarthrosis. Diffuse soft tissue swelling. IMPRESSION: Right elbow: Acute transcondylar fracture of the distal right humerus with intra-articular extension and slight anterior displacement and angulation. Electronically Signed   By: Duanne Guess D.O.   On: 10/21/2020 17:35   CT Head Wo Contrast  Result Date: 10/21/2020 CLINICAL DATA:  84 year old female with fall and head injury today. EXAM: CT HEAD WITHOUT CONTRAST TECHNIQUE: Contiguous axial images were obtained from the base of the skull through the vertex without intravenous contrast. COMPARISON:  None. FINDINGS: Brain: No evidence of acute infarction, hemorrhage, hydrocephalus, extra-axial collection or mass lesion/mass effect. Moderate periventricular white matter hypodensities are of uncertain chronicity but most likely represent chronic small-vessel white matter ischemic changes. Mild generalized cerebral atrophy is noted. Vascular: Carotid atherosclerotic calcifications are noted. Skull: Normal. Negative for fracture or focal lesion. Sinuses/Orbits: No acute finding. Other: None. IMPRESSION: 1. No evidence of acute intracranial abnormality. 2. Moderate periventricular white matter hypodensities - likely chronic small-vessel white matter ischemic changes. 3. Mild generalized cerebral atrophy. Electronically Signed   By: Harmon Pier M.D.   On: 10/21/2020 17:31   DG C-Arm 1-60 Min  Result Date: 10/23/2020 CLINICAL DATA:  Surgery.  Left and right elbow ORIF. EXAM: DG C-ARM  1-60 MIN; LEFT ELBOW - 2 VIEW; RIGHT ELBOW - 2 VIEW FLUOROSCOPY  TIME:  Fluoroscopy Time: 30 seconds on the left, 37 seconds on the right Radiation Exposure Index (if provided by the fluoroscopic device): Not provided. Number of Acquired Spot Images: 3 if the right, 6 on the left. COMPARISON:  Preoperative imaging 10/21/2020. FINDINGS: Right elbow: Medial and lateral plate and multi screw fixation of comminuted distal humerus fracture. The fracture is in improved alignment compared to preoperative imaging. Left elbow: Plate and screw fixation of displaced olecranon fracture. Fracture is in improved alignment compared to preoperative imaging. IMPRESSION: 1. Procedural fluoroscopy for ORIF of right distal humerus fracture. 2. Procedural fluoroscopy for ORIF of left olecranon fracture. Electronically Signed   By: Narda Rutherford M.D.   On: 10/23/2020 20:55   ECHOCARDIOGRAM COMPLETE  Result Date: 10/22/2020    ECHOCARDIOGRAM REPORT   Patient Name:   JANNINE ABREU Date of Exam: 10/22/2020 Medical Rec #:  [id number]          Height:       63.0 in Accession #:    [id number]         Weight:       181.0 lb Date of Birth:  04-May-1936          BSA:          1.853 m Patient Age:    84 years           BP:           146/65 mmHg Patient Gender: F                  HR:           75 bpm. Exam Location:  Inpatient Procedure: 2D Echo, Color Doppler and Cardiac Doppler Indications:    Pre-op Evaluation  History:        Patient has prior history of Echocardiogram examinations, most                 recent 06/17/2019. CHF; Risk Factors:Hypertension and                 Dyslipidemia.  Sonographer:    Irving Burton Senior RDCS Referring Phys: [id number] Ellsworth Lennox IMPRESSIONS  1. Left ventricular ejection fraction, by estimation, is 50 to 55%. The left ventricle has low normal function. The left ventricle has no regional wall motion abnormalities. There is mild concentric left ventricular hypertrophy. Left ventricular diastolic  parameters are consistent with Grade I diastolic dysfunction (impaired relaxation).  2. Right ventricular systolic function is normal. The right ventricular size is normal. Tricuspid regurgitation signal is inadequate for assessing PA pressure.  3. The mitral valve is degenerative. No evidence of mitral valve regurgitation. No evidence of mitral stenosis.  4. The aortic valve is grossly normal. Aortic valve regurgitation is not visualized. No aortic stenosis is present.  5. The inferior vena cava is normal in size with greater than 50% respiratory variability, suggesting right atrial pressure of 3 mmHg. Comparison(s): Prior images reviewed side by side. Changes from prior study are noted. The left ventricular function has improved. FINDINGS  Left Ventricle: Left ventricular ejection fraction, by estimation, is 50 to 55%. The left ventricle has low normal function. The left ventricle has no regional wall motion abnormalities. The left ventricular internal cavity size was normal in size. There is mild concentric left ventricular hypertrophy. Left ventricular diastolic parameters are consistent with Grade I diastolic dysfunction (impaired relaxation). Right Ventricle: The right ventricular size is normal. No increase in right ventricular wall thickness. Right ventricular systolic  function is normal. Tricuspid regurgitation signal is inadequate for assessing PA pressure. Left Atrium: Left atrial size was normal in size. Right Atrium: Right atrial size was normal in size. Pericardium: Trivial pericardial effusion is present. Presence of pericardial fat pad. Mitral Valve: The mitral valve is degenerative in appearance. Mild mitral annular calcification. No evidence of mitral valve regurgitation. No evidence of mitral valve stenosis. Tricuspid Valve: The tricuspid valve is grossly normal. Tricuspid valve regurgitation is trivial. No evidence of tricuspid stenosis. Aortic Valve: The aortic valve is grossly normal. Aortic  valve regurgitation is not visualized. No aortic stenosis is present. Pulmonic Valve: The pulmonic valve was grossly normal. Pulmonic valve regurgitation is not visualized. No evidence of pulmonic stenosis. Aorta: The aortic root is normal in size and structure. Venous: The inferior vena cava is normal in size with greater than 50% respiratory variability, suggesting right atrial pressure of 3 mmHg. IAS/Shunts: The atrial septum is grossly normal.  LEFT VENTRICLE PLAX 2D LVIDd:         4.30 cm  Diastology LVIDs:         3.20 cm  LV e' medial:    4.68 cm/s LV PW:         1.30 cm  LV E/e' medial:  16.7 LV IVS:        1.30 cm  LV e' lateral:   5.33 cm/s LVOT diam:     1.90 cm  LV E/e' lateral: 14.6 LV SV:         54 LV SV Index:   29 LVOT Area:     2.84 cm  RIGHT VENTRICLE RV S prime:     11.40 cm/s TAPSE (M-mode): 2.2 cm LEFT ATRIUM             Index       RIGHT ATRIUM           Index LA diam:        3.60 cm 1.94 cm/m  RA Area:     16.80 cm LA Vol (A2C):   46.9 ml 25.31 ml/m RA Volume:   41.20 ml  22.23 ml/m LA Vol (A4C):   65.3 ml 35.23 ml/m LA Biplane Vol: 58.2 ml 31.40 ml/m  AORTIC VALVE LVOT Vmax:   86.20 cm/s LVOT Vmean:  62.000 cm/s LVOT VTI:    0.189 m  AORTA Ao Root diam: 3.00 cm MITRAL VALVE MV Area (PHT): 3.21 cm     SHUNTS MV Decel Time: 236 msec     Systemic VTI:  0.19 m MV E velocity: 78.00 cm/s   Systemic Diam: 1.90 cm MV A velocity: 102.00 cm/s MV E/A ratio:  0.76 Lennie OdorWesley O'Neal MD Electronically signed by Lennie OdorWesley O'Neal MD Signature Date/Time: 10/22/2020/1:57:05 PM    Final     Micro Results     Recent Results (from the past 240 hour(s))  Respiratory Panel by RT PCR (Flu A&B, Covid) - Nasopharyngeal Swab     Status: None   Collection Time: 10/21/20  6:50 PM   Specimen: Nasopharyngeal Swab  Result Value Ref Range Status   SARS Coronavirus 2 by RT PCR NEGATIVE NEGATIVE Final    Comment: (NOTE) SARS-CoV-2 target nucleic acids are NOT DETECTED.  The SARS-CoV-2 RNA is generally  detectable in upper respiratoy specimens during the acute phase of infection. The lowest concentration of SARS-CoV-2 viral copies this assay can detect is 131 copies/mL. A negative result does not preclude SARS-Cov-2 infection and should not be used as the sole basis for  treatment or other patient management decisions. A negative result may occur with  improper specimen collection/handling, submission of specimen other than nasopharyngeal swab, presence of viral mutation(s) within the areas targeted by this assay, and inadequate number of viral copies (<131 copies/mL). A negative result must be combined with clinical observations, patient history, and epidemiological information. The expected result is Negative.  Fact Sheet for Patients:  https://www.moore.com/  Fact Sheet for Healthcare Providers:  https://www.young.biz/  This test is no t yet approved or cleared by the [place] FDA and  has been authorized for detection and/or diagnosis of SARS-CoV-2 by FDA under an Emergency Use Authorization (EUA). This EUA will remain  in effect (meaning this test can be used) for the duration of the COVID-19 declaration under Section 564(b)(1) of the Act, 21 U.S.C. section 360bbb-3(b)(1), unless the authorization is terminated or revoked sooner.     Influenza A by PCR NEGATIVE NEGATIVE Final   Influenza B by PCR NEGATIVE NEGATIVE Final    Comment: (NOTE) The Xpert Xpress SARS-CoV-2/FLU/RSV assay is intended as an aid in  the diagnosis of influenza from Nasopharyngeal swab specimens and  should not be used as a sole basis for treatment. Nasal washings and  aspirates are unacceptable for Xpert Xpress SARS-CoV-2/FLU/RSV  testing.  Fact Sheet for Patients: https://www.moore.com/  Fact Sheet for Healthcare Providers: https://www.young.biz/  This test is not yet approved or cleared by the [place] FDA and   has been authorized for detection and/or diagnosis of SARS-CoV-2 by  FDA under an Emergency Use Authorization (EUA). This EUA will remain  in effect (meaning this test can be used) for the duration of the  Covid-19 declaration under Section 564(b)(1) of the Act, 21  U.S.C. section 360bbb-3(b)(1), unless the authorization is  terminated or revoked. Performed at [facility], [address], [place], [place] [address]   Surgical pcr screen     Status: None   Collection Time: 10/22/20 12:04 AM   Specimen: Nasal Mucosa; Nasal Swab  Result Value Ref Range Status   MRSA, PCR NEGATIVE NEGATIVE Final   Staphylococcus aureus NEGATIVE NEGATIVE Final    Comment: (NOTE) The Xpert SA Assay (FDA approved for NASAL specimens in patients 82 years of age and older), is one component of a comprehensive surveillance program. It is not intended to diagnose infection nor to guide or monitor treatment. Performed at [facility], [address], [place], [place] [address]   SARS Coronavirus 2 by RT PCR (hospital order, performed in [facility] hospital lab) Nasopharyngeal Nasopharyngeal Swab     Status: None   Collection Time: 10/25/20 12:00 PM   Specimen: Nasopharyngeal Swab  Result Value Ref Range Status   SARS Coronavirus 2 NEGATIVE NEGATIVE Final    Comment: (NOTE) SARS-CoV-2 target nucleic acids are NOT DETECTED.  The SARS-CoV-2 RNA is generally detectable in upper and lower respiratory specimens during the acute phase of infection. The lowest concentration of SARS-CoV-2 viral copies this assay can detect is 250 copies / mL. A negative result does not preclude SARS-CoV-2 infection and should not be used as the sole basis for treatment or other patient management decisions.  A negative result may occur with improper specimen collection / handling, submission of specimen other than nasopharyngeal swab, presence of viral mutation(s) within the areas targeted by this assay, and  inadequate number of viral copies (<250 copies / mL). A negative result must be combined with clinical observations, patient history, and epidemiological information.  Fact  Sheet for Patients:   BoilerBrush.com.cy  Fact Sheet for Healthcare Providers: https://pope.com/  This test is not yet approved or  cleared by the [place] FDA and has been authorized for detection and/or diagnosis of SARS-CoV-2 by FDA under an Emergency Use Authorization (EUA).  This EUA will remain in effect (meaning this test can be used) for the duration of the COVID-19 declaration under Section 564(b)(1) of the Act, 21 U.S.C. section 360bbb-3(b)(1), unless the authorization is terminated or revoked sooner.  Performed at [facility], [address] [place], [place] [address]     Today   Subjective    Elyna Pangilinan today has no headache,no chest abdominal pain,no new weakness tingling or numbness, feels much better.   Objective   Blood pressure (!) 174/70, pulse 79, temperature 97.7 F (36.5 C), resp. rate 18, height  (1.6 m), weight 82.1 kg, SpO2 98 %.   Intake/Output Summary (Last 24 hours) at 10/26/2020 0914 Last data filed at 10/26/2020 0536 Gross per 24 hour  Intake 30 ml  Output 500 ml  Net -470 ml    Exam  Awake with no focal deficits, mildly confused, [place].AT,PERRAL Supple Neck,No JVD, No cervical lymphadenopathy appriciated.  Symmetrical Chest wall movement, Good air movement bilaterally, CTAB RRR,No Gallops,Rubs or new Murmurs, No Parasternal Heave +ve B.Sounds, Abd Soft, Non tender, No organomegaly appriciated, No rebound -guarding or rigidity. No Cyanosis, both elbows and hands under bandage, sensation intact in fingers with good range of motion in the fingers,   Data Review   CBC w Diff:  Lab Results  Component Value Date   WBC 12.1 (H) 10/26/2020   HGB 11.8 (L) 10/26/2020   HCT 37.6 10/26/2020   PLT  215 10/26/2020   LYMPHOPCT 24 10/26/2020   MONOPCT 10 10/26/2020   EOSPCT 2 10/26/2020   BASOPCT 1 10/26/2020    CMP:  Lab Results  Component Value Date   NA 139 10/26/2020   K 4.0 10/26/2020   CL 102 10/26/2020   CO2 30 10/26/2020   BUN 16 10/26/2020   CREATININE 0.67 10/26/2020   PROT 6.2 (L) 10/26/2020   PROT 6.9 08/06/2019   ALBUMIN 2.7 (L) 10/26/2020   BILITOT 0.6 10/26/2020   ALKPHOS 63 10/26/2020   AST 19 10/26/2020   ALT 12 10/26/2020  .   Total Time in preparing paper work, data evaluation and todays exam - 35 minutes  Susa Raring M.D on 10/26/2020 at 9:14 AM  [facility]   Office  [phone]

## 2020-10-26 NOTE — Progress Notes (Signed)
Orthopedic Tech Progress Note Patient Details:  Kayla Carrillo March 11, 1936 [id number]  Ortho Devices Type of Ortho Device: Post (long arm) splint Ortho Device/Splint Location: BUE Ortho Device/Splint Interventions: Ordered, Application, Adjustment   Post Interventions Patient Tolerated: Fair Instructions Provided: Care of device, Poper ambulation with device   Kayla Carrillo 10/26/2020, 2:23 PM

## 2020-10-27 ENCOUNTER — Non-Acute Institutional Stay (SKILLED_NURSING_FACILITY): Payer: PPO | Admitting: Adult Health

## 2020-10-27 ENCOUNTER — Encounter: Payer: Self-pay | Admitting: Adult Health

## 2020-10-27 DIAGNOSIS — S52022S Displaced fracture of olecranon process without intraarticular extension of left ulna, sequela: Secondary | ICD-10-CM

## 2020-10-27 DIAGNOSIS — S42471S Displaced transcondylar fracture of right humerus, sequela: Secondary | ICD-10-CM | POA: Diagnosis not present

## 2020-10-27 DIAGNOSIS — E782 Mixed hyperlipidemia: Secondary | ICD-10-CM

## 2020-10-27 DIAGNOSIS — Z8679 Personal history of other diseases of the circulatory system: Secondary | ICD-10-CM | POA: Diagnosis not present

## 2020-10-27 DIAGNOSIS — I10 Essential (primary) hypertension: Secondary | ICD-10-CM

## 2020-10-29 NOTE — Progress Notes (Signed)
Location:  [facility] Room Number: 112-A Place of Service:  SNF (31) Provider:  Kenard Gower, DNP, FNP-BC  Patient Care Team: Kayla Bussing, MD as PCP - General (Family Medicine) Kayla Rotunda, MD as PCP - Cardiology (Cardiology)  Extended Emergency Contact Information Primary Emergency Contact: Kayla Carrillo Mobile Phone: [phone] Relation: Daughter Interpreter needed? No  Code Status:   Full Code  Goals of care: Advanced Directive information Advanced Directives 10/21/2020  Does Patient Have a Medical Advance Directive? No  Would patient like information on creating a medical advance directive? No - Patient declined     Chief Complaint  Patient presents with  . Acute Visit    Patient is seen for hospital followup, status post admission at [facility] 10/23-10/28/21 for a fall.     HPI:  [name] who was seen for hospital follow-up. She was admitted to [facility] on 10/26/20 post hospitalization 10/21/20 to 10/26/20 for olecranon process fracture of the left elbow as well as transcondylar fracture of the distal right humerus sustained from a fall from home. She was initially brought to [facility] [place] and was recommended to be transferred to [facility]. Orthopedics was consulted and underwent ORIF right transcondylar distal humerus fracture, right ulnar nerve neuroplasty and ORIF of left distal humerus fracture (Left) olecranon fracture performed by Dr. Myrene Carrillo on 10/23/20. She was admitted to [facility] for a short-term rehabilitation.   Past Medical History:  Diagnosis Date  . Cardiomyopathy (HCC)    Takotsubo  . Depression with anxiety   . Dyslipidemia   . GERD (gastroesophageal reflux disease)    Past Surgical History:  Procedure Laterality Date  . CHOLECYSTECTOMY    . LEG SURGERY     Metal rod left leg  . ORIF ELBOW FRACTURE Right 10/23/2020   Procedure: OPEN  REDUCTION INTERNAL FIXATION (ORIF) ELBOW/OLECRANON FRACTURE;  Surgeon: Kayla Galas, MD;  Location: [facility] OR;  Service: Orthopedics;  Laterality: Right;  . ORIF HUMERUS FRACTURE Left 10/23/2020   Procedure: OPEN REDUCTION INTERNAL FIXATION LEFT DISTAL HUMERUS FRACTURE;  Surgeon: Kayla Galas, MD;  Location: [facility] OR;  Service: Orthopedics;  Laterality: Left;  . TOTAL KNEE ARTHROPLASTY     Bilateral    Allergies  Allergen Reactions  . Codeine Sulfate [Codeine] Other (See Comments)    drowsey  . Sulfa Antibiotics Other (See Comments)    drowsey    Outpatient Encounter Medications as of 10/27/2020  Medication Sig  . acetaminophen (TYLENOL) 500 MG tablet Take 1 tablet (500 mg total) by mouth every 6 (six) hours as needed for moderate pain.  . bisacodyl (DULCOLAX) 5 MG EC tablet Take 1 tablet (5 mg total) by mouth daily as needed for moderate constipation.  . calcitRIOL (ROCALTROL) 0.5 MCG capsule Take 1 capsule (0.5 mcg total) by mouth daily.  . carvedilol (COREG) 6.25 MG tablet Take 6.25 mg by mouth.   . cholecalciferol (VITAMIN D) 25 MCG tablet Take 2 tablets (2,000 Units total) by mouth daily.  Marland Kitchen docusate sodium (COLACE) 100 MG capsule Take 1 capsule (100 mg total) by mouth 2 (two) times daily.  . hydrALAZINE (APRESOLINE) 50 MG tablet Take 1 tablet (50 mg total) by mouth every 8 (eight) hours.  Marland Kitchen HYDROcodone-acetaminophen (NORCO/VICODIN) 5-325 MG tablet Take 1 tablet by mouth every 8 (eight) hours as needed for severe pain.  . nitrofurantoin (MACRODANTIN) 100 MG capsule Take 100 mg by mouth at bedtime.  Marland Kitchen omeprazole (PRILOSEC) 20  MG capsule Take 20 mg by mouth daily.  Marland Kitchen. oxybutynin (DITROPAN-XL) 10 MG 24 hr tablet Take 10 mg by mouth daily.  . sertraline (ZOLOFT) 50 MG tablet Take 50 mg by mouth daily.  . simvastatin (ZOCOR) 40 MG tablet Take 40 mg by mouth daily.   No facility-administered encounter medications on file as of 10/27/2020.    Review of Systems  GENERAL: No change in  appetite, no fatigue, no weight changes, no fever, chills or weakness MOUTH and THROAT: Denies oral discomfort, gingival pain or bleeding RESPIRATORY: no cough, SOB, DOE, wheezing, hemoptysis CARDIAC: No chest pain or palpitations GI: No abdominal pain, diarrhea, constipation, heart burn, nausea or vomiting GU: Denies dysuria, frequency, hematuria, incontinence, or discharge NEUROLOGICAL: Denies dizziness, syncope, numbness, or headache PSYCHIATRIC: Denies feelings of depression or anxiety. No report of hallucinations, insomnia, paranoia, or agitation   Immunization History  Administered Date(s) Administered  . Influenza Split 09/29/2008, 10/10/2010, 09/28/2013  . Influenza, High Dose Seasonal PF 11/04/2014, 10/20/2015, 12/02/2016, 10/15/2017, 12/15/2018  . PPD Test 10/10/2010  . Pneumococcal Conjugate-13 09/29/2008, 08/01/2015  . Pneumococcal Polysaccharide-23 10/15/2017  . Td 08/01/2015   Pertinent  Health Maintenance Due  Topic Date Due  . DEXA SCAN  Never done  . INFLUENZA VACCINE  07/30/2020  . PNA vac Low Risk Adult  Completed   No flowsheet data found.   Vitals:   10/27/20 0855  BP: (!) 155/75  Pulse: 78  Resp: 18  Temp: (!) 97.3 F (36.3 C)  TempSrc: Oral  Weight: 181 lb (82.1 kg)  Height: 5\' 3"  (1.6 m)   Body mass index is 32.06 kg/m.  Physical Exam  GENERAL APPEARANCE: Well nourished. In no acute distress. Obese SKIN:  Bilateral upper extremities with long arm splints, able to move bilateral hand fingers and no cyanosis MOUTH and THROAT: Lips are without lesions. Oral mucosa is moist and without lesions. Tongue is normal in shape, size, and color and without lesions RESPIRATORY: Breathing is even & unlabored, BS CTAB CARDIAC: RRR, no murmur,no extra heart sounds GI: Abdomen soft, normal BS, no masses, no tenderness NEUROLOGICAL: There is no tremor. Speech is clear. Alert and oriented X 3. PSYCHIATRIC:  Affect and behavior are appropriate  Labs  reviewed: Recent Labs    10/24/20 0457 10/25/20 0432 10/26/20 0344  NA 137 138 139  K 4.0 3.5 4.0  CL 103 102 102  CO2 26 23 30   GLUCOSE 155* 143* 138*  BUN 10 13 16   CREATININE 0.63 0.67 0.67  CALCIUM 8.7* 8.4* 9.0  MG 2.0 2.0 2.1   Recent Labs    10/24/20 0457 10/25/20 0432 10/26/20 0344  AST 17 14* 19  ALT 12 10 12   ALKPHOS 81 69 63  BILITOT 0.7 0.9 0.6  PROT 6.7 6.3* 6.2*  ALBUMIN 3.1* 2.8* 2.7*   Recent Labs    10/24/20 0457 10/25/20 0432 10/26/20 0344  WBC 12.6* 12.3* 12.1*  NEUTROABS 10.5* 8.8* 7.6  HGB 13.1 11.7* 11.8*  HCT 40.1 36.7 37.6  MCV 87.7 87.6 88.9  PLT 233 224 215   Lab Results  Component Value Date   TSH 2.560 10/22/2020    Significant Diagnostic Results in last 30 days:  DG Elbow 2 Views Left  Result Date: 10/23/2020 CLINICAL DATA:  Postoperative evaluation. EXAM: LEFT ELBOW - 2 VIEW COMPARISON:  October 21, 2020 FINDINGS: The left elbow was imaged in a fiberglass cast with subsequently obscured osseous detail. A radiopaque fixation plate and multiple fixation screws are seen  along the dorsal aspect of the olecranon process. There is no evidence of dislocation. Nonspecific prominence of the radial tuberosity is seen. Soft tissues are unremarkable. IMPRESSION: Status post ORIF of the olecranon process. Electronically Signed   By: Aram Candela M.D.   On: 10/23/2020 22:02   DG Elbow 2 Views Left  Result Date: 10/23/2020 CLINICAL DATA:  Surgery.  Left and right elbow ORIF. EXAM: DG C-ARM 1-60 MIN; LEFT ELBOW - 2 VIEW; RIGHT ELBOW - 2 VIEW FLUOROSCOPY TIME:  Fluoroscopy Time: 30 seconds on the left, 37 seconds on the right Radiation Exposure Index (if provided by the fluoroscopic device): Not provided. Number of Acquired Spot Images: 3 if the right, 6 on the left. COMPARISON:  Preoperative imaging 10/21/2020. FINDINGS: Right elbow: Medial and lateral plate and multi screw fixation of comminuted distal humerus fracture. The fracture is in  improved alignment compared to preoperative imaging. Left elbow: Plate and screw fixation of displaced olecranon fracture. Fracture is in improved alignment compared to preoperative imaging. IMPRESSION: 1. Procedural fluoroscopy for ORIF of right distal humerus fracture. 2. Procedural fluoroscopy for ORIF of left olecranon fracture. Electronically Signed   By: Narda Rutherford M.D.   On: 10/23/2020 20:55   DG Elbow 2 Views Right  Result Date: 10/23/2020 CLINICAL DATA:  Postoperative evaluation. EXAM: RIGHT ELBOW - 2 VIEW COMPARISON:  None. FINDINGS: Two radiopaque fixation plates and screws are seen along the dorsal aspect of the distal right humerus. The acute transcondylar fracture of the distal right humerus is poorly visualized. There is no evidence of dislocation. Soft tissues are unremarkable. IMPRESSION: Status post open reduction and internal fixation of the distal right humerus. Electronically Signed   By: Aram Candela M.D.   On: 10/23/2020 22:04   DG Elbow 2 Views Right  Result Date: 10/23/2020 CLINICAL DATA:  Surgery.  Left and right elbow ORIF. EXAM: DG C-ARM 1-60 MIN; LEFT ELBOW - 2 VIEW; RIGHT ELBOW - 2 VIEW FLUOROSCOPY TIME:  Fluoroscopy Time: 30 seconds on the left, 37 seconds on the right Radiation Exposure Index (if provided by the fluoroscopic device): Not provided. Number of Acquired Spot Images: 3 if the right, 6 on the left. COMPARISON:  Preoperative imaging 10/21/2020. FINDINGS: Right elbow: Medial and lateral plate and multi screw fixation of comminuted distal humerus fracture. The fracture is in improved alignment compared to preoperative imaging. Left elbow: Plate and screw fixation of displaced olecranon fracture. Fracture is in improved alignment compared to preoperative imaging. IMPRESSION: 1. Procedural fluoroscopy for ORIF of right distal humerus fracture. 2. Procedural fluoroscopy for ORIF of left olecranon fracture. Electronically Signed   By: Narda Rutherford M.D.    On: 10/23/2020 20:55   DG Elbow Complete Left  Result Date: 10/21/2020 CLINICAL DATA:  Left elbow pain after fall EXAM: LEFT ELBOW - COMPLETE 3+ VIEW COMPARISON:  None. FINDINGS: Acute fracture of the olecranon process of the proximal ulna with 4 mm of fracture distraction. Fracture extends intra-articularly to the ulnotrochlear joint. There is a small elbow joint hemarthrosis. No additional fractures are identified. There is prominence of the radial tuberosity, nonspecific. Degenerative changes of the elbow joint. Enthesopathic changes at the medial and lateral humeral epicondyles. Prominent soft tissue swelling/hematoma overlying the fracture site at the posterior elbow. IMPRESSION: Left elbow: 1. Acute fracture of the olecranon process of the proximal ulna with 4 mm of fracture distraction. 2. Prominent soft tissue swelling/hematoma overlying the fracture site at the fracture site. Electronically Signed   By: Duanne Guess  D.O.   On: 10/21/2020 17:31   DG Elbow Complete Right  Result Date: 10/21/2020 CLINICAL DATA:  Fall, right elbow pain EXAM: RIGHT ELBOW - COMPLETE 3+ VIEW COMPARISON:  None. FINDINGS: Acute transcondylar fracture of the distal right humerus with intra-articular extension. Slight anterior displacement and angulation at the fracture site. No fractures identified involving the proximal radius or ulna. There is a large elbow joint hemarthrosis. Diffuse soft tissue swelling. IMPRESSION: Right elbow: Acute transcondylar fracture of the distal right humerus with intra-articular extension and slight anterior displacement and angulation. Electronically Signed   By: Duanne Guess D.O.   On: 10/21/2020 17:35   CT Head Wo Contrast  Result Date: 10/21/2020 CLINICAL DATA:  84 year old female with fall and head injury today. EXAM: CT HEAD WITHOUT CONTRAST TECHNIQUE: Contiguous axial images were obtained from the base of the skull through the vertex without intravenous contrast.  COMPARISON:  None. FINDINGS: Brain: No evidence of acute infarction, hemorrhage, hydrocephalus, extra-axial collection or mass lesion/mass effect. Moderate periventricular white matter hypodensities are of uncertain chronicity but most likely represent chronic small-vessel white matter ischemic changes. Mild generalized cerebral atrophy is noted. Vascular: Carotid atherosclerotic calcifications are noted. Skull: Normal. Negative for fracture or focal lesion. Sinuses/Orbits: No acute finding. Other: None. IMPRESSION: 1. No evidence of acute intracranial abnormality. 2. Moderate periventricular white matter hypodensities - likely chronic small-vessel white matter ischemic changes. 3. Mild generalized cerebral atrophy. Electronically Signed   By: Harmon Pier M.D.   On: 10/21/2020 17:31   DG C-Arm 1-60 Min  Result Date: 10/23/2020 CLINICAL DATA:  Surgery.  Left and right elbow ORIF. EXAM: DG C-ARM 1-60 MIN; LEFT ELBOW - 2 VIEW; RIGHT ELBOW - 2 VIEW FLUOROSCOPY TIME:  Fluoroscopy Time: 30 seconds on the left, 37 seconds on the right Radiation Exposure Index (if provided by the fluoroscopic device): Not provided. Number of Acquired Spot Images: 3 if the right, 6 on the left. COMPARISON:  Preoperative imaging 10/21/2020. FINDINGS: Right elbow: Medial and lateral plate and multi screw fixation of comminuted distal humerus fracture. The fracture is in improved alignment compared to preoperative imaging. Left elbow: Plate and screw fixation of displaced olecranon fracture. Fracture is in improved alignment compared to preoperative imaging. IMPRESSION: 1. Procedural fluoroscopy for ORIF of right distal humerus fracture. 2. Procedural fluoroscopy for ORIF of left olecranon fracture. Electronically Signed   By: Narda Rutherford M.D.   On: 10/23/2020 20:55   ECHOCARDIOGRAM COMPLETE  Result Date: 10/22/2020    ECHOCARDIOGRAM REPORT   Patient Name:   Kayla Carrillo Date of Exam: 10/22/2020 Medical Rec #:  [id number]           Height:       63.0 in Accession #:    [id number]         Weight:       181.0 lb Date of Birth:  [birth year]          BSA:          1.853 m Patient Age:    84 years           BP:           146/65 mmHg Patient Gender: F                  HR:           75 bpm. Exam Location:  Inpatient Procedure: 2D Echo, Color Doppler and Cardiac Doppler Indications:    Pre-op Evaluation  History:  Patient has prior history of Echocardiogram examinations, most                 recent 06/17/2019. CHF; Risk Factors:Hypertension and                 Dyslipidemia.  Sonographer:    Irving Burton Senior RDCS Referring Phys: [id number] Ellsworth Lennox IMPRESSIONS  1. Left ventricular ejection fraction, by estimation, is 50 to 55%. The left ventricle has low normal function. The left ventricle has no regional wall motion abnormalities. There is mild concentric left ventricular hypertrophy. Left ventricular diastolic parameters are consistent with Grade I diastolic dysfunction (impaired relaxation).  2. Right ventricular systolic function is normal. The right ventricular size is normal. Tricuspid regurgitation signal is inadequate for assessing PA pressure.  3. The mitral valve is degenerative. No evidence of mitral valve regurgitation. No evidence of mitral stenosis.  4. The aortic valve is grossly normal. Aortic valve regurgitation is not visualized. No aortic stenosis is present.  5. The inferior vena cava is normal in size with greater than 50% respiratory variability, suggesting right atrial pressure of 3 mmHg. Comparison(s): Prior images reviewed side by side. Changes from prior study are noted. The left ventricular function has improved. FINDINGS  Left Ventricle: Left ventricular ejection fraction, by estimation, is 50 to 55%. The left ventricle has low normal function. The left ventricle has no regional wall motion abnormalities. The left ventricular internal cavity size was normal in size. There is mild concentric left ventricular  hypertrophy. Left ventricular diastolic parameters are consistent with Grade I diastolic dysfunction (impaired relaxation). Right Ventricle: The right ventricular size is normal. No increase in right ventricular wall thickness. Right ventricular systolic function is normal. Tricuspid regurgitation signal is inadequate for assessing PA pressure. Left Atrium: Left atrial size was normal in size. Right Atrium: Right atrial size was normal in size. Pericardium: Trivial pericardial effusion is present. Presence of pericardial fat pad. Mitral Valve: The mitral valve is degenerative in appearance. Mild mitral annular calcification. No evidence of mitral valve regurgitation. No evidence of mitral valve stenosis. Tricuspid Valve: The tricuspid valve is grossly normal. Tricuspid valve regurgitation is trivial. No evidence of tricuspid stenosis. Aortic Valve: The aortic valve is grossly normal. Aortic valve regurgitation is not visualized. No aortic stenosis is present. Pulmonic Valve: The pulmonic valve was grossly normal. Pulmonic valve regurgitation is not visualized. No evidence of pulmonic stenosis. Aorta: The aortic root is normal in size and structure. Venous: The inferior vena cava is normal in size with greater than 50% respiratory variability, suggesting right atrial pressure of 3 mmHg. IAS/Shunts: The atrial septum is grossly normal.  LEFT VENTRICLE PLAX 2D LVIDd:         4.30 cm  Diastology LVIDs:         3.20 cm  LV e' medial:    4.68 cm/s LV PW:         1.30 cm  LV E/e' medial:  16.7 LV IVS:        1.30 cm  LV e' lateral:   5.33 cm/s LVOT diam:     1.90 cm  LV E/e' lateral: 14.6 LV SV:         54 LV SV Index:   29 LVOT Area:     2.84 cm  RIGHT VENTRICLE RV S prime:     11.40 cm/s TAPSE (M-mode): 2.2 cm LEFT ATRIUM             Index  RIGHT ATRIUM           Index LA diam:        3.60 cm 1.94 cm/m  RA Area:     16.80 cm LA Vol (A2C):   46.9 ml 25.31 ml/m RA Volume:   41.20 ml  22.23 ml/m LA Vol (A4C):    65.3 ml 35.23 ml/m LA Biplane Vol: 58.2 ml 31.40 ml/m  AORTIC VALVE LVOT Vmax:   86.20 cm/s LVOT Vmean:  62.000 cm/s LVOT VTI:    0.189 m  AORTA Ao Root diam: 3.00 cm MITRAL VALVE MV Area (PHT): 3.21 cm     SHUNTS MV Decel Time: 236 msec     Systemic VTI:  0.19 m MV E velocity: 78.00 cm/s   Systemic Diam: 1.90 cm MV A velocity: 102.00 cm/s MV E/A ratio:  0.76 Lennie Odor MD Electronically signed by Lennie Odor MD Signature Date/Time: 10/22/2020/1:57:05 PM    Final     Assessment/Plan  1. Closed displaced transcondylar fracture of right humerus, sequela - S/P ORIF on 10/23/20, follow up with orthopedics on 11/08/20 -  Gentle PROM, AAROM of the elbow on both sides -  Continue PRN Norco for pain  2. Closed fracture of olecranon process of left ulna, sequela - S/P ORIF and right ulnar nerve neuroplasty on 10/23/20, follow up with orthopedics on 11/08/20 -  Continue PRN Norco -  Gentle PROM, AAROM of the elbow on both sides  3. Essential hypertension - continue hydralazine and carvedilol  4. Mixed hyperlipidemia -Continue simvastatin  5. History of cardiomyopathy -No chest pains, continue carvedilol, hydralazine and simvastatin - follows up with cardiology, Dr. Rollene Carrillo    Family/ staff Communication: Discussed plan of care with resident and charge nurse.  Labs/tests ordered: CBC and BMP in 1 week  Goals of care:   Short-term care   Kayla Gower, DNP, MSN, FNP-BC [facility] [phone] (Monday-Friday 8:00 a.m. - 5:00 p.m.) [phone] (after hours)

## 2020-10-31 ENCOUNTER — Encounter: Payer: Self-pay | Admitting: Internal Medicine

## 2020-10-31 ENCOUNTER — Non-Acute Institutional Stay (SKILLED_NURSING_FACILITY): Payer: PPO | Admitting: Internal Medicine

## 2020-10-31 DIAGNOSIS — S52022A Displaced fracture of olecranon process without intraarticular extension of left ulna, initial encounter for closed fracture: Secondary | ICD-10-CM | POA: Diagnosis not present

## 2020-10-31 DIAGNOSIS — R4189 Other symptoms and signs involving cognitive functions and awareness: Secondary | ICD-10-CM | POA: Diagnosis not present

## 2020-10-31 DIAGNOSIS — S42471S Displaced transcondylar fracture of right humerus, sequela: Secondary | ICD-10-CM | POA: Diagnosis not present

## 2020-10-31 DIAGNOSIS — R29818 Other symptoms and signs involving the nervous system: Secondary | ICD-10-CM

## 2020-10-31 DIAGNOSIS — D72829 Elevated white blood cell count, unspecified: Secondary | ICD-10-CM | POA: Diagnosis not present

## 2020-10-31 NOTE — Progress Notes (Signed)
NURSING HOME LOCATION:  [facility] ROOM NUMBER:  112/A Full Code  PCP: Koirala, Dibas, MD   This is a comprehensive admission note to [facility] performed on this date less than 30 days from date of admission. Included are preadmission medical/surgical history; reconciled medication list; family history; social history and comprehensive review of systems.  Corrections and additions to the records were documented. Comprehensive physical exam was also performed. Additionally a clinical summary was entered for each active diagnosis pertinent to this admission in the Problem List to enhance continuity of care.  HPI: Patient was hospitalized 10/23-10/28/2021 having sustained an olecranon process fracture of the left elbow as well as transcondylar fracture of the distal right humerus in a mechanical fall. She denied any cardiac or neurologic prodrome prior to the fall although she does have "occasional equilibrium issues".  Apparently she was holding a glass of water in one hand & keys to her apartment in the other when she slipped in a puddle of water in front of the door. Left elbow fracture and right humerus fracture were surgically repaired 10/23/2020 by Dr. Myrene Galas. Hospital course was complicated by hospital-acquired delirium.  Benzodiazepines were avoided and narcotics minimized. Hydralazine was added to carvedilol for improved blood pressure control. Stress related leukocytosis was documented with no sign of active infection.  Past medical and surgical history: Includes GERD, dyslipidemia, anxiety/depression, essential hypertension and Takotsubo cardiomyopathy.  The cardiomyopathy was associated with left bundle branch block.  Surgeries and procedures include cholecystectomy, and bilateral TKA.  Social history: She states that she smoked from age [age] finally up to 3 packs/day.  She states he has an occasional beer.  She has been in an independent living  facility.  Family history: Noncontributory due to advanced age.  She states there is some heart disease in the family.  Her daughter has bipolar disorder which causes her some depression.   Review of systems:  History is somewhat in question as she scored 11 on her BIMS mental status test.  She gave the date as "October 23 or 25, 2013 or 2014".  She was able to identify the president. She states that she has must wear a pad because of incontinence which with frequency is a chronic issue.  She has no other active GU symptoms.  She is on Ditropan as well as nitrofurantoin.  She is unaware of why she is on the nitrofurantoin as she denies any recent UTIs.  Constitutional: No fever, significant weight change, fatigue  Eyes: No redness, discharge, pain, vision change ENT/mouth: No nasal congestion, purulent discharge, earache, change in hearing, sore throat  Cardiovascular: No chest pain, palpitations, paroxysmal nocturnal dyspnea, claudication, edema  Respiratory: No cough, sputum production, hemoptysis, DOE, significant snoring, apnea  Gastrointestinal: No heartburn, dysphagia, abdominal pain, nausea /vomiting, rectal bleeding, melena, change in bowels Genitourinary: No dysuria, hematuria, pyuria Dermatologic: No rash, pruritus, change in appearance of skin Neurologic: No headache, syncope, seizures, numbness, tingling Psychiatric: No significant  insomnia, anorexia Endocrine: No change in hair/skin/nails, excessive thirst, excessive hunger, excessive urination  Hematologic/lymphatic: No significant bruising, lymphadenopathy, abnormal bleeding Allergy/immunology: No itchy/watery eyes, significant sneezing, urticaria, angioedema  Physical exam:  Pertinent or positive findings: She appears younger than her stated age.  The right nasolabial fold was slightly decreased and the right side of the mouth does droop minimally. Arcus senilis is present.  There is minimal hirsutism over the chin.  Heart  sounds are markedly distant.  Abdomen is protuberant.  Pedal pulses are decreased.  There is trace edema at the sock line.  She has DIP osteoarthritic changes. Upper extremities are wrapped.  General appearance: Adequately nourished; no acute distress, increased work of breathing is present.   Lymphatic: No lymphadenopathy about the head, neck, axilla. Eyes: No conjunctival inflammation or lid edema is present. There is no scleral icterus. Ears:  External ear exam shows no significant lesions or deformities.   Nose:  External nasal examination shows no deformity or inflammation. Nasal mucosa are pink and moist without lesions, exudates Oral exam: Lips and gums are healthy appearing.There is no oropharyngeal erythema or exudate. Neck:  No thyromegaly, masses, tenderness noted.    Heart:  No gallop, murmur, click, rub.  Lungs: Chest clear to auscultation without wheezes, rhonchi, rales, rubs. Abdomen: Bowel sounds are normal.  Abdomen is soft and nontender with no organomegaly, hernias, masses. GU: Deferred  Extremities:  No cyanosis, clubbing. Neurologic exam:Balance, Rhomberg, finger to nose testing could not be completed due to clinical state Skin: Warm & dry w/o tenting. No significant lesions or rash.  See clinical summary under each active problem in the Problem List with associated updated therapeutic plan

## 2020-10-31 NOTE — Assessment & Plan Note (Addendum)
She denies any active symptoms of infection; but she is on nitrofurantoin.  She is unaware of the reason. I have asked her to discuss this maintenance medication with her PCP and Urologist.

## 2020-10-31 NOTE — Patient Instructions (Signed)
See assessment and plan under each diagnosis in the problem list and acutely for this visit 

## 2020-10-31 NOTE — Assessment & Plan Note (Signed)
As she has been in independent living; [facility] MMSE will be performed to define the safety of her remaining in this living arrangement.

## 2020-10-31 NOTE — Assessment & Plan Note (Signed)
PT/OT @ SNF 

## 2020-11-06 ENCOUNTER — Other Ambulatory Visit: Payer: PPO

## 2020-11-08 ENCOUNTER — Encounter: Payer: Self-pay | Admitting: Adult Health

## 2020-11-08 ENCOUNTER — Non-Acute Institutional Stay (SKILLED_NURSING_FACILITY): Payer: PPO | Admitting: Adult Health

## 2020-11-08 ENCOUNTER — Ambulatory Visit: Payer: PPO | Admitting: Neurology

## 2020-11-08 DIAGNOSIS — I10 Essential (primary) hypertension: Secondary | ICD-10-CM | POA: Diagnosis not present

## 2020-11-08 DIAGNOSIS — S52022A Displaced fracture of olecranon process without intraarticular extension of left ulna, initial encounter for closed fracture: Secondary | ICD-10-CM

## 2020-11-08 DIAGNOSIS — N3281 Overactive bladder: Secondary | ICD-10-CM

## 2020-11-08 DIAGNOSIS — N390 Urinary tract infection, site not specified: Secondary | ICD-10-CM | POA: Diagnosis not present

## 2020-11-08 DIAGNOSIS — S42471S Displaced transcondylar fracture of right humerus, sequela: Secondary | ICD-10-CM | POA: Diagnosis not present

## 2020-11-08 DIAGNOSIS — S42471D Displaced transcondylar fracture of right humerus, subsequent encounter for fracture with routine healing: Secondary | ICD-10-CM | POA: Diagnosis not present

## 2020-11-08 NOTE — Progress Notes (Signed)
Location:  [facility] Room Number: 112-A Place of Service:  SNF (31) Provider:  Kenard Gower, DNP, FNP-BC  Patient Care Team: [name], MD as PCP - General (Family Medicine) Rollene Rotunda, MD as PCP - Cardiology (Cardiology)  Extended Emergency Contact Information Primary Emergency Contact: Linus Mako Mobile Phone: [phone] Relation: Daughter Interpreter needed? No  Code Status:  FULL CODE  Goals of care: Advanced Directive information Advanced Directives 10/31/2020  Does Patient Have a Medical Advance Directive? Yes  Does patient want to make changes to medical advance directive? No - Patient declined  Would patient like information on creating a medical advance directive? -     Chief Complaint  Patient presents with   Medical Management of Chronic Issues    Short-term rehabilitation.     HPI:  Pt is an 84 y.o. female seen today for medical management of chronic diseases.  See says short-term rehabilitation resident of [facility].  She has a PMH of Takotsubo cardiomyopathy, depression/anxiety, dyslipidemia and GERD. She continues to have splint pn bilateral upper extremities.  She stated that her pain level was 5/10.  She currently takes Norco 5-325 mg 1 time every 8 hours PRN for pain.  SBPs ranging from 129-146.  She is currently taking hydralazine 50 mg every 8 hours IV and carvedilol 6.25 mg twice daily for hypertension.  He was admitted to [facility] on 10/26/2020 post hospitalization 10/21/2020 to 10/26/2020 for olecranon process fracture of the left elbow, as well as, transcondylar fracture of the distal right humerus sustained from a fall at home.  She was initially brought [facility] and was recommended to be transferred to [facility]  Orthopedics was consulted and underwent ORIF right transcondylar humerus fracture, right ulnar nerve  neuroplasty and ORIF of left distal humerus fracture, olecranon fracture.  She denies dysuria.  She currently takes nitrofurantoin 100 mg at bedtime for recurrent UTI/prophylaxis.   Past Medical History:  Diagnosis Date   Cardiomyopathy (HCC)    Takotsubo   Depression with anxiety    Dyslipidemia    GERD (gastroesophageal reflux disease)    Past Surgical History:  Procedure Laterality Date   CHOLECYSTECTOMY     LEG SURGERY     Metal rod left leg   ORIF ELBOW FRACTURE Right 10/23/2020   Procedure: OPEN REDUCTION INTERNAL FIXATION (ORIF) ELBOW/OLECRANON FRACTURE;  Surgeon: Myrene Galas, MD;  Location: [facility] OR;  Service: Orthopedics;  Laterality: Right;   ORIF HUMERUS FRACTURE Left 10/23/2020   Procedure: OPEN REDUCTION INTERNAL FIXATION LEFT DISTAL HUMERUS FRACTURE;  Surgeon: Myrene Galas, MD;  Location: [facility] OR;  Service: Orthopedics;  Laterality: Left;   TOTAL KNEE ARTHROPLASTY     Bilateral    Allergies  Allergen Reactions   Codeine Sulfate [Codeine] Other (See Comments)    drowsey   Sulfa Antibiotics Other (See Comments)    drowsey    Outpatient Encounter Medications as of 11/08/2020  Medication Sig   acetaminophen (TYLENOL) 500 MG tablet Take 1 tablet (500 mg total) by mouth every 6 (six) hours as needed for moderate pain.   bisacodyl (DULCOLAX) 5 MG EC tablet Take 1 tablet (5 mg total) by mouth daily as needed for moderate constipation.   calcitRIOL (ROCALTROL) 0.5 MCG capsule Take 1 capsule (0.5 mcg total) by mouth daily.   carvedilol (COREG) 6.25 MG tablet Take 6.25 mg by mouth.    cholecalciferol (VITAMIN D) 25 MCG tablet Take 2 tablets (  2,000 Units total) by mouth daily.   docusate sodium (COLACE) 100 MG capsule Take 1 capsule (100 mg total) by mouth 2 (two) times daily.   Ensure (ENSURE) Take 237 mLs by mouth 2 (two) times daily between meals. To prevent malnutrition   hydrALAZINE (APRESOLINE) 50 MG tablet Take 1 tablet (50 mg total) by mouth every  8 (eight) hours.   HYDROcodone-acetaminophen (NORCO/VICODIN) 5-325 MG tablet Take 1 tablet by mouth every 8 (eight) hours as needed for severe pain.   nitrofurantoin (MACRODANTIN) 100 MG capsule Take 100 mg by mouth at bedtime.   omeprazole (PRILOSEC) 20 MG capsule Take 20 mg by mouth daily.   oxybutynin (DITROPAN-XL) 10 MG 24 hr tablet Take 10 mg by mouth daily.   sertraline (ZOLOFT) 50 MG tablet Take 50 mg by mouth daily.   simvastatin (ZOCOR) 40 MG tablet Take 40 mg by mouth daily.   No facility-administered encounter medications on file as of 11/08/2020.    Review of Systems  GENERAL: No change in appetite, no fatigue, no weight changes, no fever, chills or weakness MOUTH and THROAT: Denies oral discomfort, gingival pain or bleeding RESPIRATORY: no cough, SOB, DOE, wheezing, hemoptysis CARDIAC: No chest pain, edema or palpitations GI: No abdominal pain, diarrhea, constipation, heart burn, nausea or vomiting GU: Denies dysuria, frequency, hematuria, incontinence, or discharge NEUROLOGICAL: Denies dizziness, syncope, numbness, or headache PSYCHIATRIC: Denies feelings of depression or anxiety. No report of hallucinations, insomnia, paranoia, or agitation   Immunization History  Administered Date(s) Administered   Influenza Split 09/29/2008, 10/10/2010, 09/28/2013   Influenza, High Dose Seasonal PF 11/04/2014, 10/20/2015, 12/02/2016, 10/15/2017, 12/15/2018   PPD Test 10/10/2010   Pneumococcal Conjugate-13 09/29/2008, 08/01/2015   Pneumococcal Polysaccharide-23 10/15/2017   Td 08/01/2015   Pertinent  Health Maintenance Due  Topic Date Due   DEXA SCAN  Never done   INFLUENZA VACCINE  07/30/2020   PNA vac Low Risk Adult  Completed    Vitals:   11/08/20 1119  BP: 139/88  Pulse: 87  Resp: 19  Temp: (!) 97 F (36.1 C)  TempSrc: Oral  Weight: 179 lb 9.6 oz (81.5 kg)  Height: 5\' 3"  (1.6 m)   Body mass index is 31.81 kg/m.  Physical Exam  GENERAL  APPEARANCE: Well nourished. In no acute distress.  Obese  SKIN:  Bilateral upper extremities with splint and wrap MOUTH and THROAT: Lips are without lesions. Oral mucosa is moist and without lesions.  RESPIRATORY: Breathing is even & unlabored, BS CTAB CARDIAC: RRR, no murmur,no extra heart sounds, no edema GI: Abdomen soft, normal BS, no masses, no tenderness NEUROLOGICAL: There is no tremor. Speech is clear. Alert and oriented X 3. PSYCHIATRIC:  Affect and behavior are appropriate  Labs reviewed: Recent Labs    10/24/20 0457 10/25/20 0432 10/26/20 0344  NA 137 138 139  K 4.0 3.5 4.0  CL 103 102 102  CO2 26 23 30   GLUCOSE 155* 143* 138*  BUN 10 13 16   CREATININE 0.63 0.67 0.67  CALCIUM 8.7* 8.4* 9.0  MG 2.0 2.0 2.1   Recent Labs    10/24/20 0457 10/25/20 0432 10/26/20 0344  AST 17 14* 19  ALT 12 10 12   ALKPHOS 81 69 63  BILITOT 0.7 0.9 0.6  PROT 6.7 6.3* 6.2*  ALBUMIN 3.1* 2.8* 2.7*   Recent Labs    10/24/20 0457 10/25/20 0432 10/26/20 0344  WBC 12.6* 12.3* 12.1*  NEUTROABS 10.5* 8.8* 7.6  HGB 13.1 11.7* 11.8*  HCT 40.1 36.7  37.6  MCV 87.7 87.6 88.9  PLT 233 224 215   Lab Results  Component Value Date   TSH 2.560 10/22/2020    Significant Diagnostic Results in last 30 days:  DG Elbow 2 Views Left  Result Date: 10/23/2020 CLINICAL DATA:  Postoperative evaluation. EXAM: LEFT ELBOW - 2 VIEW COMPARISON:  October 21, 2020 FINDINGS: The left elbow was imaged in a fiberglass cast with subsequently obscured osseous detail. A radiopaque fixation plate and multiple fixation screws are seen along the dorsal aspect of the olecranon process. There is no evidence of dislocation. Nonspecific prominence of the radial tuberosity is seen. Soft tissues are unremarkable. IMPRESSION: Status post ORIF of the olecranon process. Electronically Signed   By: Aram Candela M.D.   On: 10/23/2020 22:02   DG Elbow 2 Views Left  Result Date: 10/23/2020 CLINICAL DATA:  Surgery.   Left and right elbow ORIF. EXAM: DG C-ARM 1-60 MIN; LEFT ELBOW - 2 VIEW; RIGHT ELBOW - 2 VIEW FLUOROSCOPY TIME:  Fluoroscopy Time: 30 seconds on the left, 37 seconds on the right Radiation Exposure Index (if provided by the fluoroscopic device): Not provided. Number of Acquired Spot Images: 3 if the right, 6 on the left. COMPARISON:  Preoperative imaging 10/21/2020. FINDINGS: Right elbow: Medial and lateral plate and multi screw fixation of comminuted distal humerus fracture. The fracture is in improved alignment compared to preoperative imaging. Left elbow: Plate and screw fixation of displaced olecranon fracture. Fracture is in improved alignment compared to preoperative imaging. IMPRESSION: 1. Procedural fluoroscopy for ORIF of right distal humerus fracture. 2. Procedural fluoroscopy for ORIF of left olecranon fracture. Electronically Signed   By: Narda Rutherford M.D.   On: 10/23/2020 20:55   DG Elbow 2 Views Right  Result Date: 10/23/2020 CLINICAL DATA:  Postoperative evaluation. EXAM: RIGHT ELBOW - 2 VIEW COMPARISON:  None. FINDINGS: Two radiopaque fixation plates and screws are seen along the dorsal aspect of the distal right humerus. The acute transcondylar fracture of the distal right humerus is poorly visualized. There is no evidence of dislocation. Soft tissues are unremarkable. IMPRESSION: Status post open reduction and internal fixation of the distal right humerus. Electronically Signed   By: Aram Candela M.D.   On: 10/23/2020 22:04   DG Elbow 2 Views Right  Result Date: 10/23/2020 CLINICAL DATA:  Surgery.  Left and right elbow ORIF. EXAM: DG C-ARM 1-60 MIN; LEFT ELBOW - 2 VIEW; RIGHT ELBOW - 2 VIEW FLUOROSCOPY TIME:  Fluoroscopy Time: 30 seconds on the left, 37 seconds on the right Radiation Exposure Index (if provided by the fluoroscopic device): Not provided. Number of Acquired Spot Images: 3 if the right, 6 on the left. COMPARISON:  Preoperative imaging 10/21/2020. FINDINGS: Right  elbow: Medial and lateral plate and multi screw fixation of comminuted distal humerus fracture. The fracture is in improved alignment compared to preoperative imaging. Left elbow: Plate and screw fixation of displaced olecranon fracture. Fracture is in improved alignment compared to preoperative imaging. IMPRESSION: 1. Procedural fluoroscopy for ORIF of right distal humerus fracture. 2. Procedural fluoroscopy for ORIF of left olecranon fracture. Electronically Signed   By: Narda Rutherford M.D.   On: 10/23/2020 20:55   DG Elbow Complete Left  Result Date: 10/21/2020 CLINICAL DATA:  Left elbow pain after fall EXAM: LEFT ELBOW - COMPLETE 3+ VIEW COMPARISON:  None. FINDINGS: Acute fracture of the olecranon process of the proximal ulna with 4 mm of fracture distraction. Fracture extends intra-articularly to the ulnotrochlear joint. There is a  small elbow joint hemarthrosis. No additional fractures are identified. There is prominence of the radial tuberosity, nonspecific. Degenerative changes of the elbow joint. Enthesopathic changes at the medial and lateral humeral epicondyles. Prominent soft tissue swelling/hematoma overlying the fracture site at the posterior elbow. IMPRESSION: Left elbow: 1. Acute fracture of the olecranon process of the proximal ulna with 4 mm of fracture distraction. 2. Prominent soft tissue swelling/hematoma overlying the fracture site at the fracture site. Electronically Signed   By: Duanne Guess D.O.   On: 10/21/2020 17:31   DG Elbow Complete Right  Result Date: 10/21/2020 CLINICAL DATA:  Fall, right elbow pain EXAM: RIGHT ELBOW - COMPLETE 3+ VIEW COMPARISON:  None. FINDINGS: Acute transcondylar fracture of the distal right humerus with intra-articular extension. Slight anterior displacement and angulation at the fracture site. No fractures identified involving the proximal radius or ulna. There is a large elbow joint hemarthrosis. Diffuse soft tissue swelling. IMPRESSION: Right  elbow: Acute transcondylar fracture of the distal right humerus with intra-articular extension and slight anterior displacement and angulation. Electronically Signed   By: Duanne Guess D.O.   On: 10/21/2020 17:35   CT Head Wo Contrast  Result Date: 10/21/2020 CLINICAL DATA:  84 year old female with fall and head injury today. EXAM: CT HEAD WITHOUT CONTRAST TECHNIQUE: Contiguous axial images were obtained from the base of the skull through the vertex without intravenous contrast. COMPARISON:  None. FINDINGS: Brain: No evidence of acute infarction, hemorrhage, hydrocephalus, extra-axial collection or mass lesion/mass effect. Moderate periventricular white matter hypodensities are of uncertain chronicity but most likely represent chronic small-vessel white matter ischemic changes. Mild generalized cerebral atrophy is noted. Vascular: Carotid atherosclerotic calcifications are noted. Skull: Normal. Negative for fracture or focal lesion. Sinuses/Orbits: No acute finding. Other: None. IMPRESSION: 1. No evidence of acute intracranial abnormality. 2. Moderate periventricular white matter hypodensities - likely chronic small-vessel white matter ischemic changes. 3. Mild generalized cerebral atrophy. Electronically Signed   By: Harmon Pier M.D.   On: 10/21/2020 17:31   DG C-Arm 1-60 Min  Result Date: 10/23/2020 CLINICAL DATA:  Surgery.  Left and right elbow ORIF. EXAM: DG C-ARM 1-60 MIN; LEFT ELBOW - 2 VIEW; RIGHT ELBOW - 2 VIEW FLUOROSCOPY TIME:  Fluoroscopy Time: 30 seconds on the left, 37 seconds on the right Radiation Exposure Index (if provided by the fluoroscopic device): Not provided. Number of Acquired Spot Images: 3 if the right, 6 on the left. COMPARISON:  Preoperative imaging 10/21/2020. FINDINGS: Right elbow: Medial and lateral plate and multi screw fixation of comminuted distal humerus fracture. The fracture is in improved alignment compared to preoperative imaging. Left elbow: Plate and screw  fixation of displaced olecranon fracture. Fracture is in improved alignment compared to preoperative imaging. IMPRESSION: 1. Procedural fluoroscopy for ORIF of right distal humerus fracture. 2. Procedural fluoroscopy for ORIF of left olecranon fracture. Electronically Signed   By: Narda Rutherford M.D.   On: 10/23/2020 20:55   ECHOCARDIOGRAM COMPLETE  Result Date: 10/22/2020    ECHOCARDIOGRAM REPORT   Patient Name:   Kayla Carrillo Date of Exam: 10/22/2020 Medical Rec #:  [id number]          Height:       63.0 in Accession #:    [id number]         Weight:       181.0 lb Date of Birth:  10-31-1936          BSA:          1.853 m Patient Age:  84 years           BP:           146/65 mmHg Patient Gender: F                  HR:           75 bpm. Exam Location:  Inpatient Procedure: 2D Echo, Color Doppler and Cardiac Doppler Indications:    Pre-op Evaluation  History:        Patient has prior history of Echocardiogram examinations, most                 recent 06/17/2019. CHF; Risk Factors:Hypertension and                 Dyslipidemia.  Sonographer:    Irving Burton Senior RDCS Referring Phys: [id number] Ellsworth Lennox IMPRESSIONS  1. Left ventricular ejection fraction, by estimation, is 50 to 55%. The left ventricle has low normal function. The left ventricle has no regional wall motion abnormalities. There is mild concentric left ventricular hypertrophy. Left ventricular diastolic parameters are consistent with Grade I diastolic dysfunction (impaired relaxation).  2. Right ventricular systolic function is normal. The right ventricular size is normal. Tricuspid regurgitation signal is inadequate for assessing PA pressure.  3. The mitral valve is degenerative. No evidence of mitral valve regurgitation. No evidence of mitral stenosis.  4. The aortic valve is grossly normal. Aortic valve regurgitation is not visualized. No aortic stenosis is present.  5. The inferior vena cava is normal in size with greater than 50%  respiratory variability, suggesting right atrial pressure of 3 mmHg. Comparison(s): Prior images reviewed side by side. Changes from prior study are noted. The left ventricular function has improved. FINDINGS  Left Ventricle: Left ventricular ejection fraction, by estimation, is 50 to 55%. The left ventricle has low normal function. The left ventricle has no regional wall motion abnormalities. The left ventricular internal cavity size was normal in size. There is mild concentric left ventricular hypertrophy. Left ventricular diastolic parameters are consistent with Grade I diastolic dysfunction (impaired relaxation). Right Ventricle: The right ventricular size is normal. No increase in right ventricular wall thickness. Right ventricular systolic function is normal. Tricuspid regurgitation signal is inadequate for assessing PA pressure. Left Atrium: Left atrial size was normal in size. Right Atrium: Right atrial size was normal in size. Pericardium: Trivial pericardial effusion is present. Presence of pericardial fat pad. Mitral Valve: The mitral valve is degenerative in appearance. Mild mitral annular calcification. No evidence of mitral valve regurgitation. No evidence of mitral valve stenosis. Tricuspid Valve: The tricuspid valve is grossly normal. Tricuspid valve regurgitation is trivial. No evidence of tricuspid stenosis. Aortic Valve: The aortic valve is grossly normal. Aortic valve regurgitation is not visualized. No aortic stenosis is present. Pulmonic Valve: The pulmonic valve was grossly normal. Pulmonic valve regurgitation is not visualized. No evidence of pulmonic stenosis. Aorta: The aortic root is normal in size and structure. Venous: The inferior vena cava is normal in size with greater than 50% respiratory variability, suggesting right atrial pressure of 3 mmHg. IAS/Shunts: The atrial septum is grossly normal.  LEFT VENTRICLE PLAX 2D LVIDd:         4.30 cm  Diastology LVIDs:         3.20 cm  LV e'  medial:    4.68 cm/s LV PW:         1.30 cm  LV E/e' medial:  16.7 LV IVS:  1.30 cm  LV e' lateral:   5.33 cm/s LVOT diam:     1.90 cm  LV E/e' lateral: 14.6 LV SV:         54 LV SV Index:   29 LVOT Area:     2.84 cm  RIGHT VENTRICLE RV S prime:     11.40 cm/s TAPSE (M-mode): 2.2 cm LEFT ATRIUM             Index       RIGHT ATRIUM           Index LA diam:        3.60 cm 1.94 cm/m  RA Area:     16.80 cm LA Vol (A2C):   46.9 ml 25.31 ml/m RA Volume:   41.20 ml  22.23 ml/m LA Vol (A4C):   65.3 ml 35.23 ml/m LA Biplane Vol: 58.2 ml 31.40 ml/m  AORTIC VALVE LVOT Vmax:   86.20 cm/s LVOT Vmean:  62.000 cm/s LVOT VTI:    0.189 m  AORTA Ao Root diam: 3.00 cm MITRAL VALVE MV Area (PHT): 3.21 cm     SHUNTS MV Decel Time: 236 msec     Systemic VTI:  0.19 m MV E velocity: 78.00 cm/s   Systemic Diam: 1.90 cm MV A velocity: 102.00 cm/s MV E/A ratio:  0.76 Lennie Odor MD Electronically signed by Lennie Odor MD Signature Date/Time: 10/22/2020/1:57:05 PM    Final     Assessment/Plan  1. Essential hypertension -Stable, continue hydralazine and carvedilol -  Monitor BPs  2. Recurrent UTI -Denies dysuria -Continue nitrofurantoin  3. OAB (overactive bladder) -Stable, continue oxybutynin  4. Closed olecranon fracture, left,subsequent encounter -  S/P ORIF and right ulnar nerve neuroplasty on 10/23/2020  -Has a follow-up appointment with orthopedics today -Continue PRN Norco for pain -Continue PT and OT, for therapeutic strengthening exercises  5. Closed displaced transcondylar fracture of right humerus, sequela - S/P ORIF on 10/23/20, has  Follow up with orthopedics today - continue PT and OT, for therapeutic strengthening exercises    Family/ staff Communication: Discussed plan of care with resident and charge nurse.  Labs/tests ordered: None  Goals of care:   Short-term care   Kenard Gower, DNP, MSN, FNP-BC [facility] [phone] (Monday-Friday  8:00 a.m. - 5:00 p.m.) [phone] (after hours)

## 2020-11-09 ENCOUNTER — Other Ambulatory Visit: Payer: Self-pay | Admitting: Adult Health

## 2020-11-09 MED ORDER — HYDROCODONE-ACETAMINOPHEN 5-325 MG PO TABS: 1.0000 | ORAL_TABLET | Freq: Three times a day (TID) | ORAL | 0 refills | Status: DC | PRN

## 2020-11-17 ENCOUNTER — Encounter: Payer: Self-pay | Admitting: Adult Health

## 2020-11-17 ENCOUNTER — Non-Acute Institutional Stay (SKILLED_NURSING_FACILITY): Payer: PPO | Admitting: Adult Health

## 2020-11-17 DIAGNOSIS — N3281 Overactive bladder: Secondary | ICD-10-CM | POA: Diagnosis not present

## 2020-11-17 DIAGNOSIS — S42471S Displaced transcondylar fracture of right humerus, sequela: Secondary | ICD-10-CM

## 2020-11-17 DIAGNOSIS — F339 Major depressive disorder, recurrent, unspecified: Secondary | ICD-10-CM

## 2020-11-17 DIAGNOSIS — E782 Mixed hyperlipidemia: Secondary | ICD-10-CM | POA: Diagnosis not present

## 2020-11-17 DIAGNOSIS — S52022A Displaced fracture of olecranon process without intraarticular extension of left ulna, initial encounter for closed fracture: Secondary | ICD-10-CM | POA: Diagnosis not present

## 2020-11-17 NOTE — Progress Notes (Signed)
Location:  [facility] Room Number: 112-A Place of Service:  SNF (31) Provider:  Kenard Gower, DNP, FNP-BC  Patient Care Team: [name], MD as PCP - General (Family Medicine) Rollene Rotunda, MD as PCP - Cardiology (Cardiology)  Extended Emergency Contact Information Primary Emergency Contact: Linus Mako Mobile Phone: [phone] Relation: Daughter Interpreter needed? No  Code Status:  FULL CODE  Goals of care: Advanced Directive information Advanced Directives 10/31/2020  Does Patient Have a Medical Advance Directive? Yes  Does patient want to make changes to medical advance directive? No - Patient declined  Would patient like information on creating a medical advance directive? -     Chief Complaint  Patient presents with  . Medical Management of Chronic Issues    Short-term rehabilitation visit    HPI:  Pt is an 84 y.o. female seen today for medical management of chronic diseases.  She is a short-term care resident of [facility].  She has a PMH of Takotsubo cardiomyopathy, depression with anxiety, GERD and dyslipidemia. She was seen in her room today. She was happy today due to her bilateral splints were taken down. Surgical wound healed. She takes Sertraline for depression.   She was admitted to [facility] on 10/26/2020 post hospitalization 10/21/2020 to 10/26/2020 for olecranon process fracture of the left elbow as well as transcondylar fracture of the distal right humerus sustained from a fall from home.  She was initially brought to [facility] and was recommended to be transferred to [facility].  Orthopedics was consulted and underwent ORIF right transcondylar distal humerus fracture, right ulnar nerve neuroplasty and ORIF of left distal humerus fracture (left) olecranon fracture performed by Dr. Myrene Galas on 10/23/2020.   Past Medical History:  Diagnosis  Date  . Cardiomyopathy (HCC)    Takotsubo  . Depression with anxiety   . Dyslipidemia   . GERD (gastroesophageal reflux disease)    Past Surgical History:  Procedure Laterality Date  . CHOLECYSTECTOMY    . LEG SURGERY     Metal rod left leg  . ORIF ELBOW FRACTURE Right 10/23/2020   Procedure: OPEN REDUCTION INTERNAL FIXATION (ORIF) ELBOW/OLECRANON FRACTURE;  Surgeon: Myrene Galas, MD;  Location: [facility] OR;  Service: Orthopedics;  Laterality: Right;  . ORIF HUMERUS FRACTURE Left 10/23/2020   Procedure: OPEN REDUCTION INTERNAL FIXATION LEFT DISTAL HUMERUS FRACTURE;  Surgeon: Myrene Galas, MD;  Location: [facility] OR;  Service: Orthopedics;  Laterality: Left;  . TOTAL KNEE ARTHROPLASTY     Bilateral    Allergies  Allergen Reactions  . Codeine Sulfate [Codeine] Other (See Comments)    drowsey  . Sulfa Antibiotics Other (See Comments)    drowsey    Outpatient Encounter Medications as of 11/17/2020  Medication Sig  . acetaminophen (TYLENOL) 500 MG tablet Take 1 tablet (500 mg total) by mouth every 6 (six) hours as needed for moderate pain.  . bisacodyl (DULCOLAX) 5 MG EC tablet Take 1 tablet (5 mg total) by mouth daily as needed for moderate constipation.  . calcitRIOL (ROCALTROL) 0.5 MCG capsule Take 1 capsule (0.5 mcg total) by mouth daily.  . carvedilol (COREG) 6.25 MG tablet Take 6.25 mg by mouth 2 (two) times daily with a meal.   . cholecalciferol (VITAMIN D) 25 MCG tablet Take 2 tablets (2,000 Units total) by mouth daily.  Marland Kitchen docusate sodium (COLACE) 100 MG capsule Take 1 capsule (100 mg total) by mouth 2 (two) times daily.  Marland Kitchen  Ensure (ENSURE) Take 237 mLs by mouth 2 (two) times daily between meals. To prevent malnutrition  . hydrALAZINE (APRESOLINE) 50 MG tablet Take 1 tablet (50 mg total) by mouth every 8 (eight) hours.  Marland Kitchen HYDROcodone-acetaminophen (NORCO/VICODIN) 5-325 MG tablet Take 1 tablet by mouth every 8 (eight) hours as needed for severe pain.  . nitrofurantoin (MACRODANTIN)  100 MG capsule Take 100 mg by mouth at bedtime.  Marland Kitchen omeprazole (PRILOSEC) 20 MG capsule Take 20 mg by mouth daily.  Marland Kitchen oxybutynin (DITROPAN-XL) 10 MG 24 hr tablet Take 10 mg by mouth daily.  . sertraline (ZOLOFT) 50 MG tablet Take 50 mg by mouth daily.  . simvastatin (ZOCOR) 40 MG tablet Take 40 mg by mouth daily.   No facility-administered encounter medications on file as of 11/17/2020.    Review of Systems  GENERAL: No change in appetite, no fatigue, no weight changes, no fever, chills or weakness MOUTH and THROAT: Denies oral discomfort, gingival pain or bleeding RESPIRATORY: no cough, SOB, DOE, wheezing, hemoptysis CARDIAC: No chest pain or palpitations GI: No abdominal pain, diarrhea, constipation, heart burn, nausea or vomiting GU: Denies dysuria, frequency, hematuria, incontinence, or discharge NEUROLOGICAL: Denies dizziness, syncope, numbness, or headache PSYCHIATRIC: Denies feelings of depression or anxiety. No report of hallucinations, insomnia, paranoia, or agitation   Immunization History  Administered Date(s) Administered  . Influenza Split 09/29/2008, 10/10/2010, 09/28/2013  . Influenza, High Dose Seasonal PF 11/04/2014, 10/20/2015, 12/02/2016, 10/15/2017, 12/15/2018  . Influenza-Unspecified 11/02/2020  . Moderna SARS-COVID-2 Vaccination 03/10/2020  . PFIZER SARS-COV-2 Vaccination 04/07/2020  . PPD Test 10/10/2010  . Pneumococcal Conjugate-13 09/29/2008, 08/01/2015  . Pneumococcal Polysaccharide-23 10/15/2017  . Td 08/01/2015   Pertinent  Health Maintenance Due  Topic Date Due  . DEXA SCAN  Never done  . INFLUENZA VACCINE  Completed  . PNA vac Low Risk Adult  Completed    Vitals:   11/17/20 1427  BP: (!) 113/55  Pulse: 75  Resp: 18  Temp: 97.9 F (36.6 C)  TempSrc: Oral  Weight: 177 lb 9.6 oz (80.6 kg)  Height: 5\' 3"  (1.6 m)   Body mass index is 31.46 kg/m.  Physical Exam  GENERAL APPEARANCE: Well nourished. In no acute distress. Obese SKIN:  Skin  is warm and dry.  MOUTH and THROAT: Lips are without lesions. Oral mucosa is moist and without lesions. Tongue is normal in shape, size, and color and without lesions RESPIRATORY: Breathing is even & unlabored, BS CTAB CARDIAC: RRR, no murmur,no extra heart sounds, BLE 1+ edema GI: Abdomen soft, normal BS, no masses, no tenderness EXTREMITIES:  Able to move X 4 extremities NEUROLOGICAL: There is no tremor. Speech is clear. Alert and oriented X 3. PSYCHIATRIC:  Affect and behavior are appropriate  Labs reviewed: Recent Labs    10/24/20 0457 10/25/20 0432 10/26/20 0344  NA 137 138 139  K 4.0 3.5 4.0  CL 103 102 102  CO2 26 23 30   GLUCOSE 155* 143* 138*  BUN 10 13 16   CREATININE 0.63 0.67 0.67  CALCIUM 8.7* 8.4* 9.0  MG 2.0 2.0 2.1   Recent Labs    10/24/20 0457 10/25/20 0432 10/26/20 0344  AST 17 14* 19  ALT 12 10 12   ALKPHOS 81 69 63  BILITOT 0.7 0.9 0.6  PROT 6.7 6.3* 6.2*  ALBUMIN 3.1* 2.8* 2.7*   Recent Labs    10/24/20 0457 10/25/20 0432 10/26/20 0344  WBC 12.6* 12.3* 12.1*  NEUTROABS 10.5* 8.8* 7.6  HGB 13.1 11.7* 11.8*  HCT 40.1 36.7 37.6  MCV 87.7 87.6 88.9  PLT 233 224 215   Lab Results  Component Value Date   TSH 2.560 10/22/2020    Significant Diagnostic Results in last 30 days:  DG Elbow 2 Views Left  Result Date: 10/23/2020 CLINICAL DATA:  Postoperative evaluation. EXAM: LEFT ELBOW - 2 VIEW COMPARISON:  October 21, 2020 FINDINGS: The left elbow was imaged in a fiberglass cast with subsequently obscured osseous detail. A radiopaque fixation plate and multiple fixation screws are seen along the dorsal aspect of the olecranon process. There is no evidence of dislocation. Nonspecific prominence of the radial tuberosity is seen. Soft tissues are unremarkable. IMPRESSION: Status post ORIF of the olecranon process. Electronically Signed   By: Aram Candelahaddeus  Houston M.D.   On: 10/23/2020 22:02   DG Elbow 2 Views Left  Result Date: 10/23/2020 CLINICAL DATA:   Surgery.  Left and right elbow ORIF. EXAM: DG C-ARM 1-60 MIN; LEFT ELBOW - 2 VIEW; RIGHT ELBOW - 2 VIEW FLUOROSCOPY TIME:  Fluoroscopy Time: 30 seconds on the left, 37 seconds on the right Radiation Exposure Index (if provided by the fluoroscopic device): Not provided. Number of Acquired Spot Images: 3 if the right, 6 on the left. COMPARISON:  Preoperative imaging 10/21/2020. FINDINGS: Right elbow: Medial and lateral plate and multi screw fixation of comminuted distal humerus fracture. The fracture is in improved alignment compared to preoperative imaging. Left elbow: Plate and screw fixation of displaced olecranon fracture. Fracture is in improved alignment compared to preoperative imaging. IMPRESSION: 1. Procedural fluoroscopy for ORIF of right distal humerus fracture. 2. Procedural fluoroscopy for ORIF of left olecranon fracture. Electronically Signed   By: Narda RutherfordMelanie  Sanford M.D.   On: 10/23/2020 20:55   DG Elbow 2 Views Right  Result Date: 10/23/2020 CLINICAL DATA:  Postoperative evaluation. EXAM: RIGHT ELBOW - 2 VIEW COMPARISON:  None. FINDINGS: Two radiopaque fixation plates and screws are seen along the dorsal aspect of the distal right humerus. The acute transcondylar fracture of the distal right humerus is poorly visualized. There is no evidence of dislocation. Soft tissues are unremarkable. IMPRESSION: Status post open reduction and internal fixation of the distal right humerus. Electronically Signed   By: Aram Candelahaddeus  Houston M.D.   On: 10/23/2020 22:04   DG Elbow 2 Views Right  Result Date: 10/23/2020 CLINICAL DATA:  Surgery.  Left and right elbow ORIF. EXAM: DG C-ARM 1-60 MIN; LEFT ELBOW - 2 VIEW; RIGHT ELBOW - 2 VIEW FLUOROSCOPY TIME:  Fluoroscopy Time: 30 seconds on the left, 37 seconds on the right Radiation Exposure Index (if provided by the fluoroscopic device): Not provided. Number of Acquired Spot Images: 3 if the right, 6 on the left. COMPARISON:  Preoperative imaging 10/21/2020.  FINDINGS: Right elbow: Medial and lateral plate and multi screw fixation of comminuted distal humerus fracture. The fracture is in improved alignment compared to preoperative imaging. Left elbow: Plate and screw fixation of displaced olecranon fracture. Fracture is in improved alignment compared to preoperative imaging. IMPRESSION: 1. Procedural fluoroscopy for ORIF of right distal humerus fracture. 2. Procedural fluoroscopy for ORIF of left olecranon fracture. Electronically Signed   By: Narda RutherfordMelanie  Sanford M.D.   On: 10/23/2020 20:55   DG Elbow Complete Left  Result Date: 10/21/2020 CLINICAL DATA:  Left elbow pain after fall EXAM: LEFT ELBOW - COMPLETE 3+ VIEW COMPARISON:  None. FINDINGS: Acute fracture of the olecranon process of the proximal ulna with 4 mm of fracture distraction. Fracture extends intra-articularly to the ulnotrochlear joint.  There is a small elbow joint hemarthrosis. No additional fractures are identified. There is prominence of the radial tuberosity, nonspecific. Degenerative changes of the elbow joint. Enthesopathic changes at the medial and lateral humeral epicondyles. Prominent soft tissue swelling/hematoma overlying the fracture site at the posterior elbow. IMPRESSION: Left elbow: 1. Acute fracture of the olecranon process of the proximal ulna with 4 mm of fracture distraction. 2. Prominent soft tissue swelling/hematoma overlying the fracture site at the fracture site. Electronically Signed   By: Duanne Guess D.O.   On: 10/21/2020 17:31   DG Elbow Complete Right  Result Date: 10/21/2020 CLINICAL DATA:  Fall, right elbow pain EXAM: RIGHT ELBOW - COMPLETE 3+ VIEW COMPARISON:  None. FINDINGS: Acute transcondylar fracture of the distal right humerus with intra-articular extension. Slight anterior displacement and angulation at the fracture site. No fractures identified involving the proximal radius or ulna. There is a large elbow joint hemarthrosis. Diffuse soft tissue swelling.  IMPRESSION: Right elbow: Acute transcondylar fracture of the distal right humerus with intra-articular extension and slight anterior displacement and angulation. Electronically Signed   By: Duanne Guess D.O.   On: 10/21/2020 17:35   CT Head Wo Contrast  Result Date: 10/21/2020 CLINICAL DATA:  84 year old female with fall and head injury today. EXAM: CT HEAD WITHOUT CONTRAST TECHNIQUE: Contiguous axial images were obtained from the base of the skull through the vertex without intravenous contrast. COMPARISON:  None. FINDINGS: Brain: No evidence of acute infarction, hemorrhage, hydrocephalus, extra-axial collection or mass lesion/mass effect. Moderate periventricular white matter hypodensities are of uncertain chronicity but most likely represent chronic small-vessel white matter ischemic changes. Mild generalized cerebral atrophy is noted. Vascular: Carotid atherosclerotic calcifications are noted. Skull: Normal. Negative for fracture or focal lesion. Sinuses/Orbits: No acute finding. Other: None. IMPRESSION: 1. No evidence of acute intracranial abnormality. 2. Moderate periventricular white matter hypodensities - likely chronic small-vessel white matter ischemic changes. 3. Mild generalized cerebral atrophy. Electronically Signed   By: Harmon Pier M.D.   On: 10/21/2020 17:31   DG C-Arm 1-60 Min  Result Date: 10/23/2020 CLINICAL DATA:  Surgery.  Left and right elbow ORIF. EXAM: DG C-ARM 1-60 MIN; LEFT ELBOW - 2 VIEW; RIGHT ELBOW - 2 VIEW FLUOROSCOPY TIME:  Fluoroscopy Time: 30 seconds on the left, 37 seconds on the right Radiation Exposure Index (if provided by the fluoroscopic device): Not provided. Number of Acquired Spot Images: 3 if the right, 6 on the left. COMPARISON:  Preoperative imaging 10/21/2020. FINDINGS: Right elbow: Medial and lateral plate and multi screw fixation of comminuted distal humerus fracture. The fracture is in improved alignment compared to preoperative imaging. Left elbow:  Plate and screw fixation of displaced olecranon fracture. Fracture is in improved alignment compared to preoperative imaging. IMPRESSION: 1. Procedural fluoroscopy for ORIF of right distal humerus fracture. 2. Procedural fluoroscopy for ORIF of left olecranon fracture. Electronically Signed   By: Narda Rutherford M.D.   On: 10/23/2020 20:55   ECHOCARDIOGRAM COMPLETE  Result Date: 10/22/2020    ECHOCARDIOGRAM REPORT   Patient Name:   Kayla Carrillo Date of Exam: 10/22/2020 Medical Rec #:  [id number]          Height:       63.0 in Accession #:    [id number]         Weight:       181.0 lb Date of Birth:  1936-07-28          BSA:          1.853  m Patient Age:    84 years           BP:           146/65 mmHg Patient Gender: F                  HR:           75 bpm. Exam Location:  Inpatient Procedure: 2D Echo, Color Doppler and Cardiac Doppler Indications:    Pre-op Evaluation  History:        Patient has prior history of Echocardiogram examinations, most                 recent 06/17/2019. CHF; Risk Factors:Hypertension and                 Dyslipidemia.  Sonographer:    Irving Burton Senior RDCS Referring Phys: [id number] Ellsworth Lennox IMPRESSIONS  1. Left ventricular ejection fraction, by estimation, is 50 to 55%. The left ventricle has low normal function. The left ventricle has no regional wall motion abnormalities. There is mild concentric left ventricular hypertrophy. Left ventricular diastolic parameters are consistent with Grade I diastolic dysfunction (impaired relaxation).  2. Right ventricular systolic function is normal. The right ventricular size is normal. Tricuspid regurgitation signal is inadequate for assessing PA pressure.  3. The mitral valve is degenerative. No evidence of mitral valve regurgitation. No evidence of mitral stenosis.  4. The aortic valve is grossly normal. Aortic valve regurgitation is not visualized. No aortic stenosis is present.  5. The inferior vena cava is normal in size with greater  than 50% respiratory variability, suggesting right atrial pressure of 3 mmHg. Comparison(s): Prior images reviewed side by side. Changes from prior study are noted. The left ventricular function has improved. FINDINGS  Left Ventricle: Left ventricular ejection fraction, by estimation, is 50 to 55%. The left ventricle has low normal function. The left ventricle has no regional wall motion abnormalities. The left ventricular internal cavity size was normal in size. There is mild concentric left ventricular hypertrophy. Left ventricular diastolic parameters are consistent with Grade I diastolic dysfunction (impaired relaxation). Right Ventricle: The right ventricular size is normal. No increase in right ventricular wall thickness. Right ventricular systolic function is normal. Tricuspid regurgitation signal is inadequate for assessing PA pressure. Left Atrium: Left atrial size was normal in size. Right Atrium: Right atrial size was normal in size. Pericardium: Trivial pericardial effusion is present. Presence of pericardial fat pad. Mitral Valve: The mitral valve is degenerative in appearance. Mild mitral annular calcification. No evidence of mitral valve regurgitation. No evidence of mitral valve stenosis. Tricuspid Valve: The tricuspid valve is grossly normal. Tricuspid valve regurgitation is trivial. No evidence of tricuspid stenosis. Aortic Valve: The aortic valve is grossly normal. Aortic valve regurgitation is not visualized. No aortic stenosis is present. Pulmonic Valve: The pulmonic valve was grossly normal. Pulmonic valve regurgitation is not visualized. No evidence of pulmonic stenosis. Aorta: The aortic root is normal in size and structure. Venous: The inferior vena cava is normal in size with greater than 50% respiratory variability, suggesting right atrial pressure of 3 mmHg. IAS/Shunts: The atrial septum is grossly normal.  LEFT VENTRICLE PLAX 2D LVIDd:         4.30 cm  Diastology LVIDs:         3.20 cm   LV e' medial:    4.68 cm/s LV PW:         1.30 cm  LV E/e' medial:  16.7  LV IVS:        1.30 cm  LV e' lateral:   5.33 cm/s LVOT diam:     1.90 cm  LV E/e' lateral: 14.6 LV SV:         54 LV SV Index:   29 LVOT Area:     2.84 cm  RIGHT VENTRICLE RV S prime:     11.40 cm/s TAPSE (M-mode): 2.2 cm LEFT ATRIUM             Index       RIGHT ATRIUM           Index LA diam:        3.60 cm 1.94 cm/m  RA Area:     16.80 cm LA Vol (A2C):   46.9 ml 25.31 ml/m RA Volume:   41.20 ml  22.23 ml/m LA Vol (A4C):   65.3 ml 35.23 ml/m LA Biplane Vol: 58.2 ml 31.40 ml/m  AORTIC VALVE LVOT Vmax:   86.20 cm/s LVOT Vmean:  62.000 cm/s LVOT VTI:    0.189 m  AORTA Ao Root diam: 3.00 cm MITRAL VALVE MV Area (PHT): 3.21 cm     SHUNTS MV Decel Time: 236 msec     Systemic VTI:  0.19 m MV E velocity: 78.00 cm/s   Systemic Diam: 1.90 cm MV A velocity: 102.00 cm/s MV E/A ratio:  0.76 Lennie Odor MD Electronically signed by Lennie Odor MD Signature Date/Time: 10/22/2020/1:57:05 PM    Final     Assessment/Plan  1. OAB (overactive bladder) -  Stable, continue oxybutynin  2. Mixed hyperlipidemia -Continue simvastatin  3. Closed olecranon fracture, left,subsequent encounter -  S/P ORIF and right ulnar nerve neuroplasty on 10/23/2020 -Follow-up with orthopedics -Continue PT and OT, for therapeutic strengthening exercises  4. Closed displaced transcondylar fracture of right humerus, sequela - S/P ORIF on 10/23/2020, follow-up with orthopedics -Continue PRN Norco -Continue PT and OT, for therapeutic strengthening exercises  5. Major depression, recurrent, chronic (HCC) -Stable, continue sertraline     Family/ staff Communication: Discussed plan of care with resident and charge nurse.  Labs/tests ordered: None  Goals of care:   Short-term care   Kenard Gower, DNP, MSN, FNP-BC [facility] [phone] (Monday-Friday 8:00 a.m. - 5:00 p.m.) [phone] (after hours)

## 2020-11-20 DIAGNOSIS — S52022D Displaced fracture of olecranon process without intraarticular extension of left ulna, subsequent encounter for closed fracture with routine healing: Secondary | ICD-10-CM | POA: Diagnosis not present

## 2020-11-20 DIAGNOSIS — S42471D Displaced transcondylar fracture of right humerus, subsequent encounter for fracture with routine healing: Secondary | ICD-10-CM | POA: Diagnosis not present

## 2020-11-21 ENCOUNTER — Encounter: Payer: Self-pay | Admitting: Adult Health

## 2020-11-21 ENCOUNTER — Non-Acute Institutional Stay (SKILLED_NURSING_FACILITY): Payer: PPO | Admitting: Adult Health

## 2020-11-21 DIAGNOSIS — U071 COVID-19: Secondary | ICD-10-CM

## 2020-11-21 DIAGNOSIS — I10 Essential (primary) hypertension: Secondary | ICD-10-CM | POA: Diagnosis not present

## 2020-11-21 NOTE — Progress Notes (Signed)
Location:  [facility] Room Number: 311-A Place of Service:  SNF (31) Provider:  Kenard Gower, DNP, FNP-BC  Patient Care Team: [name], MD as PCP - General (Family Medicine) Rollene Rotunda, MD as PCP - Cardiology (Cardiology)  Extended Emergency Contact Information Primary Emergency Contact: Linus Mako Mobile Phone: [phone] Relation: Daughter Interpreter needed? No  Code Status:  FULL CODE  Goals of care: Advanced Directive information Advanced Directives 10/31/2020  Does Patient Have a Medical Advance Directive? Yes  Does patient want to make changes to medical advance directive? No - Patient declined  Would patient like information on creating a medical advance directive? -     Chief Complaint  Patient presents with   Acute Visit    Patient is seen for COVID + swab    HPI:  Pt is aN 84 y.o. female seen today for an acute visit secondary to a positive COVID-19 swab.  She is a short-term rehabilitation resident of [facility].  She has a PMH of Takotsubo cardiomyopathy, depression with anxiety, GERD and dyslipidemia. No reported fever, cough, body aches nor loss of taste and smell. She is fully vaccinated with Moderna COVID-19 vaccine, 2/2.SBPs ranging from 112 to 142. She takes hydralazine 50 mg 1 tab every 8 hours and carvedilol 6.25 mg 1 tab twice a day for hypertension. She is currently having a short-term rehabilitation due to S/P ORIF right transcondylar distal humerus fracture, right ulnar nerve neuroplasty and ORIF of left distal humerus fracture (left) olecranon fracture on 10/23/2020.   Past Medical History:  Diagnosis Date   Cardiomyopathy (HCC)    Takotsubo   Depression with anxiety    Dyslipidemia    GERD (gastroesophageal reflux disease)    Past Surgical History:  Procedure Laterality Date   CHOLECYSTECTOMY     LEG SURGERY     Metal rod left leg   ORIF ELBOW FRACTURE Right  10/23/2020   Procedure: OPEN REDUCTION INTERNAL FIXATION (ORIF) ELBOW/OLECRANON FRACTURE;  Surgeon: Myrene Galas, MD;  Location: [facility] OR;  Service: Orthopedics;  Laterality: Right;   ORIF HUMERUS FRACTURE Left 10/23/2020   Procedure: OPEN REDUCTION INTERNAL FIXATION LEFT DISTAL HUMERUS FRACTURE;  Surgeon: Myrene Galas, MD;  Location: [facility] OR;  Service: Orthopedics;  Laterality: Left;   TOTAL KNEE ARTHROPLASTY     Bilateral    Allergies  Allergen Reactions   Codeine Sulfate [Codeine] Other (See Comments)    drowsey   Sulfa Antibiotics Other (See Comments)    drowsey    Outpatient Encounter Medications as of 11/21/2020  Medication Sig   acetaminophen (TYLENOL) 500 MG tablet Take 1 tablet (500 mg total) by mouth every 6 (six) hours as needed for moderate pain.   bisacodyl (DULCOLAX) 5 MG EC tablet Take 1 tablet (5 mg total) by mouth daily as needed for moderate constipation.   calcitRIOL (ROCALTROL) 0.5 MCG capsule Take 1 capsule (0.5 mcg total) by mouth daily.   carvedilol (COREG) 6.25 MG tablet Take 6.25 mg by mouth 2 (two) times daily with a meal.    cholecalciferol (VITAMIN D) 25 MCG tablet Take 2 tablets (2,000 Units total) by mouth daily.   docusate sodium (COLACE) 100 MG capsule Take 1 capsule (100 mg total) by mouth 2 (two) times daily.   Ensure (ENSURE) Take 237 mLs by mouth 2 (two) times daily between meals. To prevent malnutrition   hydrALAZINE (APRESOLINE) 50 MG tablet Take 1 tablet (50 mg total) by mouth every 8 (eight) hours.  HYDROcodone-acetaminophen (NORCO/VICODIN) 5-325 MG tablet Take 1 tablet by mouth every 8 (eight) hours as needed for severe pain.   nitrofurantoin (MACRODANTIN) 100 MG capsule Take 100 mg by mouth at bedtime.   omeprazole (PRILOSEC) 20 MG capsule Take 20 mg by mouth daily.   oxybutynin (DITROPAN-XL) 10 MG 24 hr tablet Take 10 mg by mouth daily.   sertraline (ZOLOFT) 50 MG tablet Take 50 mg by mouth daily.   simvastatin (ZOCOR) 40 MG  tablet Take 40 mg by mouth daily.   No facility-administered encounter medications on file as of 11/21/2020.    Review of Systems  GENERAL: No change in appetite, no fatigue, no weight changes, no fever, chills or weakness MOUTH and THROAT: Denies oral discomfort, gingival pain or bleeding RESPIRATORY: no cough, SOB, DOE, wheezing, hemoptysis CARDIAC: No chest pain, edema or palpitations GI: No abdominal pain, diarrhea, constipation, heart burn, nausea or vomiting GU: Denies dysuria, frequency, hematuria, incontinence, or discharge NEUROLOGICAL: Denies dizziness, syncope, numbness, or headache PSYCHIATRIC: Denies feelings of depression or anxiety. No report of hallucinations, insomnia, paranoia, or agitation   Immunization History  Administered Date(s) Administered   Influenza Split 09/29/2008, 10/10/2010, 09/28/2013   Influenza, High Dose Seasonal PF 11/04/2014, 10/20/2015, 12/02/2016, 10/15/2017, 12/15/2018   Influenza-Unspecified 11/02/2020   Moderna SARS-COVID-2 Vaccination 03/10/2020   PFIZER SARS-COV-2 Vaccination 04/07/2020   PPD Test 10/10/2010   Pneumococcal Conjugate-13 09/29/2008, 08/01/2015   Pneumococcal Polysaccharide-23 10/15/2017   Td 08/01/2015   Pertinent  Health Maintenance Due  Topic Date Due   DEXA SCAN  Never done   INFLUENZA VACCINE  Completed   PNA vac Low Risk Adult  Completed    Vitals:   11/21/20 1519  BP: 125/70  Pulse: 83  Resp: 17  Temp: (!) 97.5 F (36.4 C)  TempSrc: Oral  SpO2: 93%  Weight: 178 lb 12.8 oz (81.1 kg)  Height: 5\' 3"  (1.6 m)   Body mass index is 31.67 kg/m.  Physical Exam  GENERAL APPEARANCE: Well nourished. In no acute distress. Obese. SKIN:  Skin is warm and dry.  MOUTH and THROAT: Lips are without lesions. Oral mucosa is moist and without lesions. Tongue is normal in shape, size, and color and without lesions RESPIRATORY: Breathing is even & unlabored, BS CTAB CARDIAC: RRR, no murmur,no extra heart  sounds, no edema GI: Abdomen soft, normal BS, no masses, no tenderness EXTREMITIES:  Able to move X4 extremities NEUROLOGICAL: There is no tremor. Speech is clear. Alert and oriented X 3. PSYCHIATRIC:  Affect and behavior are appropriate  Labs reviewed: Recent Labs    10/24/20 0457 10/25/20 0432 10/26/20 0344  NA 137 138 139  K 4.0 3.5 4.0  CL 103 102 102  CO2 26 23 30   GLUCOSE 155* 143* 138*  BUN 10 13 16   CREATININE 0.63 0.67 0.67  CALCIUM 8.7* 8.4* 9.0  MG 2.0 2.0 2.1   Recent Labs    10/24/20 0457 10/25/20 0432 10/26/20 0344  AST 17 14* 19  ALT 12 10 12   ALKPHOS 81 69 63  BILITOT 0.7 0.9 0.6  PROT 6.7 6.3* 6.2*  ALBUMIN 3.1* 2.8* 2.7*   Recent Labs    10/24/20 0457 10/25/20 0432 10/26/20 0344  WBC 12.6* 12.3* 12.1*  NEUTROABS 10.5* 8.8* 7.6  HGB 13.1 11.7* 11.8*  HCT 40.1 36.7 37.6  MCV 87.7 87.6 88.9  PLT 233 224 215   Lab Results  Component Value Date   TSH 2.560 10/22/2020   Significant Diagnostic Results in last 30  days:  DG Elbow 2 Views Left  Result Date: 10/23/2020 CLINICAL DATA:  Postoperative evaluation. EXAM: LEFT ELBOW - 2 VIEW COMPARISON:  October 21, 2020 FINDINGS: The left elbow was imaged in a fiberglass cast with subsequently obscured osseous detail. A radiopaque fixation plate and multiple fixation screws are seen along the dorsal aspect of the olecranon process. There is no evidence of dislocation. Nonspecific prominence of the radial tuberosity is seen. Soft tissues are unremarkable. IMPRESSION: Status post ORIF of the olecranon process. Electronically Signed   By: Aram Candela M.D.   On: 10/23/2020 22:02   DG Elbow 2 Views Left  Result Date: 10/23/2020 CLINICAL DATA:  Surgery.  Left and right elbow ORIF. EXAM: DG C-ARM 1-60 MIN; LEFT ELBOW - 2 VIEW; RIGHT ELBOW - 2 VIEW FLUOROSCOPY TIME:  Fluoroscopy Time: 30 seconds on the left, 37 seconds on the right Radiation Exposure Index (if provided by the fluoroscopic device): Not  provided. Number of Acquired Spot Images: 3 if the right, 6 on the left. COMPARISON:  Preoperative imaging 10/21/2020. FINDINGS: Right elbow: Medial and lateral plate and multi screw fixation of comminuted distal humerus fracture. The fracture is in improved alignment compared to preoperative imaging. Left elbow: Plate and screw fixation of displaced olecranon fracture. Fracture is in improved alignment compared to preoperative imaging. IMPRESSION: 1. Procedural fluoroscopy for ORIF of right distal humerus fracture. 2. Procedural fluoroscopy for ORIF of left olecranon fracture. Electronically Signed   By: Narda Rutherford M.D.   On: 10/23/2020 20:55   DG Elbow 2 Views Right  Result Date: 10/23/2020 CLINICAL DATA:  Postoperative evaluation. EXAM: RIGHT ELBOW - 2 VIEW COMPARISON:  None. FINDINGS: Two radiopaque fixation plates and screws are seen along the dorsal aspect of the distal right humerus. The acute transcondylar fracture of the distal right humerus is poorly visualized. There is no evidence of dislocation. Soft tissues are unremarkable. IMPRESSION: Status post open reduction and internal fixation of the distal right humerus. Electronically Signed   By: Aram Candela M.D.   On: 10/23/2020 22:04   DG Elbow 2 Views Right  Result Date: 10/23/2020 CLINICAL DATA:  Surgery.  Left and right elbow ORIF. EXAM: DG C-ARM 1-60 MIN; LEFT ELBOW - 2 VIEW; RIGHT ELBOW - 2 VIEW FLUOROSCOPY TIME:  Fluoroscopy Time: 30 seconds on the left, 37 seconds on the right Radiation Exposure Index (if provided by the fluoroscopic device): Not provided. Number of Acquired Spot Images: 3 if the right, 6 on the left. COMPARISON:  Preoperative imaging 10/21/2020. FINDINGS: Right elbow: Medial and lateral plate and multi screw fixation of comminuted distal humerus fracture. The fracture is in improved alignment compared to preoperative imaging. Left elbow: Plate and screw fixation of displaced olecranon fracture. Fracture is in  improved alignment compared to preoperative imaging. IMPRESSION: 1. Procedural fluoroscopy for ORIF of right distal humerus fracture. 2. Procedural fluoroscopy for ORIF of left olecranon fracture. Electronically Signed   By: Narda Rutherford M.D.   On: 10/23/2020 20:55   DG C-Arm 1-60 Min  Result Date: 10/23/2020 CLINICAL DATA:  Surgery.  Left and right elbow ORIF. EXAM: DG C-ARM 1-60 MIN; LEFT ELBOW - 2 VIEW; RIGHT ELBOW - 2 VIEW FLUOROSCOPY TIME:  Fluoroscopy Time: 30 seconds on the left, 37 seconds on the right Radiation Exposure Index (if provided by the fluoroscopic device): Not provided. Number of Acquired Spot Images: 3 if the right, 6 on the left. COMPARISON:  Preoperative imaging 10/21/2020. FINDINGS: Right elbow: Medial and lateral plate and multi  screw fixation of comminuted distal humerus fracture. The fracture is in improved alignment compared to preoperative imaging. Left elbow: Plate and screw fixation of displaced olecranon fracture. Fracture is in improved alignment compared to preoperative imaging. IMPRESSION: 1. Procedural fluoroscopy for ORIF of right distal humerus fracture. 2. Procedural fluoroscopy for ORIF of left olecranon fracture. Electronically Signed   By: Narda Rutherford M.D.   On: 10/23/2020 20:55    Assessment/Plan  1. Lab test positive for detection of COVID-19 virus -She is asymptomatic - will continue to monitor and will remain in COVID-19 isolation unit   2. Essential hypertension - stable, continue hydralazine and carvedilol     Family/ staff Communication: Discussed plan of care with resident and charge nurse.  Labs/tests ordered: None  Goals of care:   Short-term care   Kenard Gower, DNP, MSN, FNP-BC [facility] [phone] (Monday-Friday 8:00 a.m. - 5:00 p.m.) [phone] (after hours)

## 2020-11-22 LAB — COMPREHENSIVE METABOLIC PANEL
Calcium: 9.4 (ref 8.7–10.7)
GFR calc Af Amer: 90
GFR calc non Af Amer: 90

## 2020-11-22 LAB — CBC AND DIFFERENTIAL
HCT: 36 (ref 36–46)
Hemoglobin: 12.3 (ref 12.0–16.0)
Neutrophils Absolute: 4.7
Platelets: 231 (ref 150–399)
WBC: 7.3

## 2020-11-22 LAB — BASIC METABOLIC PANEL
BUN: 9 (ref 4–21)
CO2: 24 — AB (ref 13–22)
Chloride: 102 (ref 99–108)
Creatinine: 0.5 (ref 0.5–1.1)
Glucose: 117
Potassium: 4.1 (ref 3.4–5.3)
Sodium: 139 (ref 137–147)

## 2020-11-22 LAB — CBC: RBC: 4.33 (ref 3.87–5.11)

## 2020-11-29 DIAGNOSIS — F4321 Adjustment disorder with depressed mood: Secondary | ICD-10-CM | POA: Diagnosis not present

## 2020-11-30 ENCOUNTER — Non-Acute Institutional Stay (SKILLED_NURSING_FACILITY): Payer: PPO | Admitting: Adult Health

## 2020-11-30 ENCOUNTER — Encounter: Payer: Self-pay | Admitting: Adult Health

## 2020-11-30 DIAGNOSIS — R4189 Other symptoms and signs involving cognitive functions and awareness: Secondary | ICD-10-CM

## 2020-11-30 DIAGNOSIS — U071 COVID-19: Secondary | ICD-10-CM | POA: Diagnosis not present

## 2020-11-30 DIAGNOSIS — R29818 Other symptoms and signs involving the nervous system: Secondary | ICD-10-CM | POA: Diagnosis not present

## 2020-11-30 DIAGNOSIS — I10 Essential (primary) hypertension: Secondary | ICD-10-CM | POA: Diagnosis not present

## 2020-11-30 MED ORDER — ASPIRIN EC 81 MG PO TBEC: 81.0000 mg | DELAYED_RELEASE_TABLET | Freq: Every day | ORAL | 0 refills | Status: AC

## 2020-11-30 MED ORDER — GUAIFENESIN ER 600 MG PO TB12: 600.0000 mg | ORAL_TABLET | Freq: Two times a day (BID) | ORAL | 0 refills | Status: AC

## 2020-11-30 MED ORDER — ZINC SULFATE 220 (50 ZN) MG PO CAPS: 220.0000 mg | ORAL_CAPSULE | Freq: Every day | ORAL | 0 refills | Status: AC

## 2020-11-30 MED ORDER — SACCHAROMYCES BOULARDII 250 MG PO CAPS: 250.0000 mg | ORAL_CAPSULE | Freq: Two times a day (BID) | ORAL | 0 refills | Status: AC

## 2020-11-30 MED ORDER — AZITHROMYCIN 250 MG PO TABS: ORAL_TABLET | ORAL | 0 refills | Status: AC

## 2020-11-30 NOTE — Progress Notes (Signed)
Location:  [facility] Room Number: 112-A Place of Service:  SNF (31) Provider:  Kenard Gower, DNP, FNP-BC  Patient Care Team: [name], MD as PCP - General (Family Medicine) Rollene Rotunda, MD as PCP - Cardiology (Cardiology)  Extended Emergency Contact Information Primary Emergency Contact: Linus Mako Mobile Phone: [phone] Relation: Daughter Interpreter needed? No  Code Status:  FULL CODE  Goals of care: Advanced Directive information Advanced Directives 10/31/2020  Does Patient Have a Medical Advance Directive? Yes  Does patient want to make changes to medical advance directive? No - Patient declined  Would patient like information on creating a medical advance directive? -     Chief Complaint  Patient presents with   Acute Visit    Patient is seen to followup on a COVID positive swab    HPI:  Pt is an 84 y.o. female seen today for an acute visit secondary to a positive COVID-19 screen.  She is a short-term care resident of [facility].  She has a PMH of Takotsubo cardiomyopathy, depression/anxiety, dyslipidemia and GERD.  She has just finished isolation at the COVID-19 unit and now back to her regular room.  She is fully vaccinated with Moderna COVID-19 vaccine, 2/2. She was noted to have hoarse voice today. No reported fever nor SOB. She was noted to have cough. She stated that her cough is chronic. She reported that she has phlegm which is whitish to yellowish in color.   Past Medical History:  Diagnosis Date   Cardiomyopathy (HCC)    Takotsubo   Depression with anxiety    Dyslipidemia    GERD (gastroesophageal reflux disease)    Past Surgical History:  Procedure Laterality Date   CHOLECYSTECTOMY     LEG SURGERY     Metal rod left leg   ORIF ELBOW FRACTURE Right 10/23/2020   Procedure: OPEN REDUCTION INTERNAL FIXATION (ORIF) ELBOW/OLECRANON FRACTURE;  Surgeon: Myrene Galas, MD;   Location: [facility] OR;  Service: Orthopedics;  Laterality: Right;   ORIF HUMERUS FRACTURE Left 10/23/2020   Procedure: OPEN REDUCTION INTERNAL FIXATION LEFT DISTAL HUMERUS FRACTURE;  Surgeon: Myrene Galas, MD;  Location: [facility] OR;  Service: Orthopedics;  Laterality: Left;   TOTAL KNEE ARTHROPLASTY     Bilateral    Allergies  Allergen Reactions   Codeine Sulfate [Codeine] Other (See Comments)    drowsey   Sulfa Antibiotics Other (See Comments)    drowsey    Outpatient Encounter Medications as of 11/30/2020  Medication Sig   acetaminophen (TYLENOL) 500 MG tablet Take 1 tablet (500 mg total) by mouth every 6 (six) hours as needed for moderate pain.   aspirin EC 81 MG tablet Take 1 tablet (81 mg total) by mouth daily for 10 days. Swallow whole.   azithromycin (ZITHROMAX) 250 MG tablet 1 tab orally twice a day X 10 days   bisacodyl (DULCOLAX) 5 MG EC tablet Take 1 tablet (5 mg total) by mouth daily as needed for moderate constipation.   calcitRIOL (ROCALTROL) 0.5 MCG capsule Take 1 capsule (0.5 mcg total) by mouth daily.   carvedilol (COREG) 6.25 MG tablet Take 6.25 mg by mouth 2 (two) times daily with a meal.    cholecalciferol (VITAMIN D) 25 MCG tablet Take 2 tablets (2,000 Units total) by mouth daily.   docusate sodium (COLACE) 100 MG capsule Take 1 capsule (100 mg total) by mouth 2 (two) times daily.   Ensure (ENSURE) Take 237 mLs by mouth 2 (two) times daily between  meals. To prevent malnutrition   guaiFENesin (MUCINEX) 600 MG 12 hr tablet Take 1 tablet (600 mg total) by mouth every 12 (twelve) hours for 7 days.   hydrALAZINE (APRESOLINE) 50 MG tablet Take 1 tablet (50 mg total) by mouth every 8 (eight) hours.   HYDROcodone-acetaminophen (NORCO/VICODIN) 5-325 MG tablet Take 1 tablet by mouth every 8 (eight) hours as needed for severe pain.   nitrofurantoin (MACRODANTIN) 100 MG capsule Take 100 mg by mouth at bedtime.   omeprazole (PRILOSEC) 20 MG capsule Take 20 mg by mouth  daily.   oxybutynin (DITROPAN-XL) 10 MG 24 hr tablet Take 10 mg by mouth daily.   saccharomyces boulardii (FLORASTOR) 250 MG capsule Take 1 capsule (250 mg total) by mouth 2 (two) times daily for 13 days.   sertraline (ZOLOFT) 50 MG tablet Take 75 mg by mouth daily. Give 1.5 tablets to = 75 mg   simvastatin (ZOCOR) 40 MG tablet Take 40 mg by mouth daily.   zinc sulfate 220 (50 Zn) MG capsule Take 1 capsule (220 mg total) by mouth daily for 5 days.   No facility-administered encounter medications on file as of 11/30/2020.    Review of Systems  GENERAL: No change in appetite, no fatigue, no weight changes, no fever, chills or weakness MOUTH and THROAT: Denies oral discomfort, gingival pain or bleeding RESPIRATORY: +cough CARDIAC: No chest pain, edema or palpitations GI: No abdominal pain, diarrhea, constipation, heart burn, nausea or vomiting GU: Denies dysuria, frequency, hematuria, incontinence, or discharge NEUROLOGICAL: Denies dizziness, syncope, numbness, or headache PSYCHIATRIC: Denies feelings of depression or anxiety. No report of hallucinations, insomnia, paranoia, or agitation   Immunization History  Administered Date(s) Administered   Influenza Split 09/29/2008, 10/10/2010, 09/28/2013   Influenza, High Dose Seasonal PF 11/04/2014, 10/20/2015, 12/02/2016, 10/15/2017, 12/15/2018   Influenza-Unspecified 11/02/2020   Moderna SARS-COVID-2 Vaccination 03/10/2020   PFIZER SARS-COV-2 Vaccination 04/07/2020   PPD Test 10/10/2010   Pneumococcal Conjugate-13 09/29/2008, 08/01/2015   Pneumococcal Polysaccharide-23 10/15/2017   Td 08/01/2015   Pertinent  Health Maintenance Due  Topic Date Due   DEXA SCAN  Never done   INFLUENZA VACCINE  Completed   PNA vac Low Risk Adult  Completed    Vitals:   11/30/20 1519  BP: 128/75  Pulse: 86  Resp: 20  Temp: (!) 97.3 F (36.3 C)  TempSrc: Oral  Weight: 178 lb 12.8 oz (81.1 kg)  Height: 5\' 3"  (1.6 m)   Body mass  index is 31.67 kg/m.  Physical Exam  GENERAL APPEARANCE: Well nourished. In no acute distress. Obese. SKIN:  Skin is warm and dry.  MOUTH and THROAT: Lips are without lesions. Oral mucosa is moist and without lesions.  RESPIRATORY: Breathing is even & unlabored, BS CTAB CARDIAC: RRR, no murmur,no extra heart sounds, no edema GI: Abdomen soft, normal BS, no masses, no tenderness EXTREMITIES:  Able to move X 4 extremities NEUROLOGICAL: There is no tremor. Speech is clear. Alert to self, disoriented to time and place. PSYCHIATRIC:  Affect and behavior are appropriate  Labs reviewed: Recent Labs    10/24/20 0457 10/24/20 0457 10/25/20 0432 10/26/20 0344 11/22/20 0000  NA 137   < > 138 139 139  K 4.0   < > 3.5 4.0 4.1  CL 103   < > 102 102 102  CO2 26   < > 23 30 24*  GLUCOSE 155*  --  143* 138*  --   BUN 10   < > 13 16 9  CREATININE 0.63   < > 0.67 0.67 0.5  CALCIUM 8.7*   < > 8.4* 9.0 9.4  MG 2.0  --  2.0 2.1  --    < > = values in this interval not displayed.   Recent Labs    10/24/20 0457 10/25/20 0432 10/26/20 0344  AST 17 14* 19  ALT 12 10 12   ALKPHOS 81 69 63  BILITOT 0.7 0.9 0.6  PROT 6.7 6.3* 6.2*  ALBUMIN 3.1* 2.8* 2.7*   Recent Labs    10/24/20 0457 10/24/20 0457 10/25/20 0432 10/26/20 0344 11/22/20 0000  WBC 12.6*   < > 12.3* 12.1* 7.3  NEUTROABS 10.5*   < > 8.8* 7.6 4.70  HGB 13.1   < > 11.7* 11.8* 12.3  HCT 40.1   < > 36.7 37.6 36  MCV 87.7  --  87.6 88.9  --   PLT 233   < > 224 215 231   < > = values in this interval not displayed.   Lab Results  Component Value Date   TSH 2.560 10/22/2020    Assessment/Plan  1. Lab test positive for detection of COVID-19 virus - shie is high risk for severe COVID-19 symptoms and will need to be started on [facility] COVID-19 recomemded treatment - azithromycin (ZITHROMAX) 250 MG tablet; 1 tab orally twice a day X 10 days  Dispense: 20 tablet; Refill: 0 - saccharomyces boulardii  (FLORASTOR) 250 MG capsule; Take 1 capsule (250 mg total) by mouth 2 (two) times daily for 13 days.  Dispense: 26 capsule; Refill: 0 - zinc sulfate 220 (50 Zn) MG capsule; Take 1 capsule (220 mg total) by mouth daily for 5 days.  Dispense: 5 capsule; Refill: 0 - guaiFENesin (MUCINEX) 600 MG 12 hr tablet; Take 1 tablet (600 mg total) by mouth every 12 (twelve) hours for 7 days.  Dispense: 14 tablet; Refill: 0 - aspirin EC 81 MG tablet; Take 1 tablet (81 mg total) by mouth daily for 10 days. Swallow whole.  Dispense: 10 tablet; Refill: 0  2. Essential hypertension -  Stable, continue hydralazine and carvedilol  3. Neurocognitive deficits -  BIMS score 11/15, ranging in moderate cognitive impairment -Continue supportive care    Family/ staff Communication: Discussed plan of care with resident and charge nurse.  Labs/tests ordered:  None  Goals of care:   Short-term care   12/15, DNP, MSN, FNP-BC [facility] [phone] (Monday-Friday 8:00 a.m. - 5:00 p.m.) [phone] (after hours)

## 2020-12-08 DIAGNOSIS — H353211 Exudative age-related macular degeneration, right eye, with active choroidal neovascularization: Secondary | ICD-10-CM | POA: Diagnosis not present

## 2020-12-10 DIAGNOSIS — I517 Cardiomegaly: Secondary | ICD-10-CM

## 2020-12-10 HISTORY — DX: Cardiomegaly: I51.7

## 2020-12-10 NOTE — Progress Notes (Signed)
Cardiology Office Note   Date:  12/12/2020   ID:  Kambryn Dapolito, DOB [birth year], MRN [id number]  PCP:  Lujean Amel, MD  Cardiologist:   Minus Breeding, MD Referring:  Lujean Amel, MD  Chief Complaint  Patient presents with  . Cardiomyopathy      History of Present Illness: Kayla Carrillo is a 84 y.o. female who is referred by Lujean Amel, MD presents for cardiomyopathy.   She had a history of Takotsubo cardiomyopathy many years ago.  I saw her as a new patient and she was to get an echo but then the pandemic happened.  She had the echo in 2020 that  demonstrated a reduced EF of 45% but with severe LVH.    However, follow up in October of this year demonstrated the EF to be 50 - 55%.  There has been a mention of significant left ventricular hypertrophy but this was not identified at the last echo.  I had sent her for a PYP scan which was equivocal.  We had discussed doing a myeloma panel through her primary providers however, I do not see that this happened.  Since I saw her she fell and broke her elbows.  She had to have surgery.  She is still currently at rehab.  She is doing physical therapy.  She gets around with a walker because of decreased balance.  She does say that she has gained 5 pounds in the last 2 days but she is not reporting any shortness of breath.  She does not describe PND or orthopnea.  Has had no swelling.  She denies any chest pressure, neck or arm discomfort.  She is here with her ex daughter-in-law who is a caregiver.  Patient seems to have some slight short-term memory problems.   Past Medical History:  Diagnosis Date  . Cardiomyopathy ([place])    Takotsubo  . Depression with anxiety   . Dyslipidemia   . GERD (gastroesophageal reflux disease)     Past Surgical History:  Procedure Laterality Date  . CHOLECYSTECTOMY    . LEG SURGERY     Metal rod left leg  . ORIF ELBOW FRACTURE Right 10/23/2020   Procedure: OPEN REDUCTION INTERNAL  FIXATION (ORIF) ELBOW/OLECRANON FRACTURE;  Surgeon: Altamese Essex Fells, MD;  Location: [facility];  Service: Orthopedics;  Laterality: Right;  . ORIF HUMERUS FRACTURE Left 10/23/2020   Procedure: OPEN REDUCTION INTERNAL FIXATION LEFT DISTAL HUMERUS FRACTURE;  Surgeon: Altamese , MD;  Location: [facility];  Service: Orthopedics;  Laterality: Left;  . TOTAL KNEE ARTHROPLASTY     Bilateral     Current Outpatient Medications  Medication Sig Dispense Refill  . acetaminophen (TYLENOL) 500 MG tablet Take 1 tablet (500 mg total) by mouth every 6 (six) hours as needed for moderate pain. 100 tablet 2  . bisacodyl (DULCOLAX) 5 MG EC tablet Take 1 tablet (5 mg total) by mouth daily as needed for moderate constipation. 30 tablet 0  . calcitRIOL (ROCALTROL) 0.5 MCG capsule Take 1 capsule (0.5 mcg total) by mouth daily.    . cholecalciferol (VITAMIN D) 25 MCG tablet Take 2 tablets (2,000 Units total) by mouth daily.    Marland Kitchen docusate sodium (COLACE) 100 MG capsule Take 1 capsule (100 mg total) by mouth 2 (two) times daily. 10 capsule 0  . Ensure (ENSURE) Take 237 mLs by mouth 2 (two) times daily between meals. To prevent malnutrition    . HYDROcodone-acetaminophen (NORCO/VICODIN) 5-325 MG tablet Take 1 tablet by mouth every 8 (  eight) hours as needed for severe pain. 30 tablet 0  . nitrofurantoin (MACRODANTIN) 100 MG capsule Take 100 mg by mouth at bedtime.    Marland Kitchen omeprazole (PRILOSEC) 20 MG capsule Take 20 mg by mouth daily.    Marland Kitchen oxybutynin (DITROPAN-XL) 10 MG 24 hr tablet Take 10 mg by mouth daily.    Marland Kitchen saccharomyces boulardii (FLORASTOR) 250 MG capsule Take 1 capsule (250 mg total) by mouth 2 (two) times daily for 13 days. 26 capsule 0  . sertraline (ZOLOFT) 50 MG tablet Take 75 mg by mouth daily. Give 1.5 tablets to = 75 mg    . carvedilol (COREG) 6.25 MG tablet Take 1 tablet (6.25 mg total) by mouth 2 (two) times daily with a meal. 180 tablet 3  . hydrALAZINE (APRESOLINE) 50 MG tablet Take 1 tablet (50 mg total) by  mouth in the morning and at bedtime. 180 tablet 3  . simvastatin (ZOCOR) 40 MG tablet Take 1 tablet (40 mg total) by mouth daily. 90 tablet 3   No current facility-administered medications for this visit.    Allergies:   Codeine sulfate [codeine] and Sulfa antibiotics    ROS:  Please see the history of present illness.   Otherwise, review of systems are positive for none.   All other systems are reviewed and negative.    PHYSICAL EXAM: VS:  BP 112/62   Pulse 77   Wt 179 lb 6.4 oz (81.4 kg)   SpO2 96%   BMI 31.78 kg/m  , BMI Body mass index is 31.78 kg/m. GEN:  No distress NECK:  No jugular venous distention at 90 degrees, waveform within normal limits, carotid upstroke brisk and symmetric, no bruits, no thyromegaly LYMPHATICS:  No cervical adenopathy LUNGS:  Clear to auscultation bilaterally BACK:  No CVA tenderness CHEST:  Unremarkable HEART:  S1 and S2 within normal limits, no S3, no S4, no clicks, no rubs, no murmurs ABD:  Positive bowel sounds normal in frequency in pitch, no bruits, no rebound, no guarding, unable to assess midline mass or bruit with the patient seated. EXT:  2 plus pulses throughout, trace edema, no cyanosis no clubbing SKIN:  No rashes no nodules NEURO:  Cranial nerves II through XII grossly intact, motor grossly intact throughout PSYCH:  Cognitively intact, oriented to person place and time   EKG:  EKG is  ordered today. Normal sinus rhythm, rate 86, interventricular conduction delay with left axis deviation, no change from previous.  Recent Labs: 10/22/2020: TSH 2.560 10/26/2020: ALT 12; B Natriuretic Peptide 62.4; Magnesium 2.1 11/22/2020: BUN 9; Creatinine 0.5; Hemoglobin 12.3; Platelets 231; Potassium 4.1; Sodium 139    Lipid Panel No results found for: CHOL, TRIG, HDL, CHOLHDL, VLDL, LDLCALC, LDLDIRECT    Wt Readings from Last 3 Encounters:  12/12/20 179 lb 6.4 oz (81.4 kg)  11/30/20 178 lb 12.8 oz (81.1 kg)  11/21/20 178 lb 12.8 oz  (81.1 kg)      Other studies Reviewed: Additional studies/ records that were reviewed today include: Hospital records Review of the above records demonstrates:  Please see elsewhere in the note.     ASSESSMENT AND PLAN:  CARDIOMYOPATHY:     Her ejection fraction is low normal.  That the left ventricular hypertrophy was not severe.  PYP scan which I thought had a low pretest probability was nondiagnostic.  For completeness sake I will do an myeloma panel.   There is a question of weight gain but it does not fit the clinical picture  today.  We talked about salt restriction.  We talked about judicious drinking fluid.  She would let me know if she has increased weight or any symptoms of shortness of breath or signs such as swelling at which point I might give her a diuretic.  She would like to avoid this because frequent urination is a problem.   LVH:   This has been mild.  Evaluation as above.   HTN: She is on hydralazine 3 times daily but would not be able to take this at home 3 times a day.  Her daughter-in-law thinks that she could take it twice a day so I will change this to hydralazine 50 mg twice daily.  FALL: There was no suggestion of syncope.  She had a mechanical fall.  No further work-up.    SLEEP APNEA; of note she does report that she has this "never could use the CPAP and does not want to consider this.   Current medicines are reviewed at length with the patient today.  The patient does not have concerns regarding medicines.  The following changes have been made:  As above  Labs/ tests ordered today include:    Orders Placed This Encounter  Procedures  . Multiple Myeloma Panel (SPEP&IFE w/QIG)     Disposition:   FU with me in 3 months.     Signed, Minus Breeding, MD  12/12/2020 4:16 PM    [facility]

## 2020-12-12 ENCOUNTER — Encounter: Payer: Self-pay | Admitting: Cardiology

## 2020-12-12 ENCOUNTER — Other Ambulatory Visit: Payer: Self-pay

## 2020-12-12 ENCOUNTER — Ambulatory Visit: Payer: PPO | Admitting: Cardiology

## 2020-12-12 VITALS — BP 112/62 | HR 77 | Wt 179.4 lb

## 2020-12-12 DIAGNOSIS — I517 Cardiomegaly: Secondary | ICD-10-CM

## 2020-12-12 DIAGNOSIS — I420 Dilated cardiomyopathy: Secondary | ICD-10-CM | POA: Diagnosis not present

## 2020-12-12 MED ORDER — HYDRALAZINE HCL 50 MG PO TABS: 50.0000 mg | ORAL_TABLET | Freq: Two times a day (BID) | ORAL | 3 refills | Status: DC

## 2020-12-12 MED ORDER — CARVEDILOL 6.25 MG PO TABS
6.2500 mg | ORAL_TABLET | Freq: Two times a day (BID) | ORAL | 3 refills | Status: DC
Start: 2020-12-12 — End: 2020-12-20

## 2020-12-12 MED ORDER — SIMVASTATIN 40 MG PO TABS: 40.0000 mg | ORAL_TABLET | Freq: Every day | ORAL | 3 refills | Status: DC

## 2020-12-12 NOTE — Patient Instructions (Addendum)
Medication Instructions:  Decrease Hydralazine to 50mg  twice a day *If you need a refill on your cardiac medications before your next appointment, please call your pharmacy*  Lab Work: Myeloma Panel  Testing/Procedures: None ordered this visit  Follow-Up: At [facility], you and your health needs are our priority.  As part of our continuing mission to provide you with exceptional heart care, we have created designated Provider Care Teams.  These Care Teams include your primary Cardiologist (physician) and Advanced Practice Providers (APPs -  Physician Assistants and Nurse Practitioners) who all work together to provide you with the care you need, when you need it.   Your next appointment:   3 month(s)  The format for your next appointment:   In Person  Provider:   [facility], MD

## 2020-12-13 DIAGNOSIS — F4323 Adjustment disorder with mixed anxiety and depressed mood: Secondary | ICD-10-CM | POA: Diagnosis not present

## 2020-12-13 LAB — MULTIPLE MYELOMA PANEL, SERUM
Albumin SerPl Elph-Mcnc: 3.2 g/dL (ref 2.9–4.4)
Albumin/Glob SerPl: 1 (ref 0.7–1.7)
Alpha 1: 0.3 g/dL (ref 0.0–0.4)
Alpha2 Glob SerPl Elph-Mcnc: 0.8 g/dL (ref 0.4–1.0)
B-Globulin SerPl Elph-Mcnc: 1 g/dL (ref 0.7–1.3)
Gamma Glob SerPl Elph-Mcnc: 1.4 g/dL (ref 0.4–1.8)
Globulin, Total: 3.5 g/dL (ref 2.2–3.9)
IgA/Immunoglobulin A, Serum: 325 mg/dL (ref 64–422)
IgG (Immunoglobin G), Serum: 1393 mg/dL (ref 586–1602)
IgM (Immunoglobulin M), Srm: 39 mg/dL (ref 26–217)
Total Protein: 6.7 g/dL (ref 6.0–8.5)

## 2020-12-15 ENCOUNTER — Encounter: Payer: Self-pay | Admitting: Adult Health

## 2020-12-15 ENCOUNTER — Non-Acute Institutional Stay (SKILLED_NURSING_FACILITY): Payer: PPO | Admitting: Adult Health

## 2020-12-15 DIAGNOSIS — N3281 Overactive bladder: Secondary | ICD-10-CM

## 2020-12-15 DIAGNOSIS — S42471S Displaced transcondylar fracture of right humerus, sequela: Secondary | ICD-10-CM

## 2020-12-15 DIAGNOSIS — N390 Urinary tract infection, site not specified: Secondary | ICD-10-CM

## 2020-12-15 DIAGNOSIS — F339 Major depressive disorder, recurrent, unspecified: Secondary | ICD-10-CM | POA: Diagnosis not present

## 2020-12-15 DIAGNOSIS — S52022S Displaced fracture of olecranon process without intraarticular extension of left ulna, sequela: Secondary | ICD-10-CM

## 2020-12-15 DIAGNOSIS — I10 Essential (primary) hypertension: Secondary | ICD-10-CM | POA: Diagnosis not present

## 2020-12-15 NOTE — Progress Notes (Signed)
Location:  [facility] Room Number: 112/A Place of Service:  SNF (31) Provider:  Kenard Gower, DNP, FNP-BC  Patient Care Team: [name], MD as PCP - General (Family Medicine) Rollene Rotunda, MD as PCP - Cardiology (Cardiology)  Extended Emergency Contact Information Primary Emergency Contact: Linus Mako Mobile Phone: [phone] Relation: Daughter Interpreter needed? No  Code Status:  Full Code  Goals of care: Advanced Directive information Advanced Directives 12/15/2020  Does Patient Have a Medical Advance Directive? No  Does patient want to make changes to medical advance directive? -  Would patient like information on creating a medical advance directive? No - Patient declined     Chief Complaint  Patient presents with  . Medical Management of Chronic Issues    Routine Visit of Medical Management     HPI:  [name] seen today for medical management of chronic diseases. She is a short-term care resident of [facility]. She has PMH of Takotsubo cardiomyopathy, depression/anxiety, dyslipidemia and GERD.She was recently treated for COVID-19  Infection with Azithromycin, Mucinex. She is now back to her regular room from COVID-19 isolation unit.  SBPs ranging from 111 to 144.  She takes carvedilol 6.25 mg twice a day and hydralazine 50 mg twice a day for hypertension.  No reported dysuria nor hematuria.  She thinks nitrofurantoin 100 mg at bedtime for recurrent UTI prophylaxis.   Past Medical History:  Diagnosis Date  . Cardiomyopathy ([facility])    Takotsubo  . Depression with anxiety   . Dyslipidemia   . GERD (gastroesophageal reflux disease)    Past Surgical History:  Procedure Laterality Date  . CHOLECYSTECTOMY    . LEG SURGERY     Metal rod left leg  . ORIF ELBOW FRACTURE Right 10/23/2020   Procedure: OPEN REDUCTION INTERNAL FIXATION (ORIF) ELBOW/OLECRANON FRACTURE;  Surgeon: Myrene Galas,  MD;  Location: [facility];  Service: Orthopedics;  Laterality: Right;  . ORIF HUMERUS FRACTURE Left 10/23/2020   Procedure: OPEN REDUCTION INTERNAL FIXATION LEFT DISTAL HUMERUS FRACTURE;  Surgeon: Myrene Galas, MD;  Location: [facility] OR;  Service: Orthopedics;  Laterality: Left;  . TOTAL KNEE ARTHROPLASTY     Bilateral    Allergies  Allergen Reactions  . Codeine Sulfate [Codeine] Other (See Comments)    drowsey  . Sulfa Antibiotics Other (See Comments)    drowsey    Outpatient Encounter Medications as of 12/15/2020  Medication Sig  . acetaminophen (TYLENOL) 500 MG tablet Take 1 tablet (500 mg total) by mouth every 6 (six) hours as needed for moderate pain.  . bisacodyl (DULCOLAX) 5 MG EC tablet Take 1 tablet (5 mg total) by mouth daily as needed for moderate constipation.  . calcitRIOL (ROCALTROL) 0.5 MCG capsule Take 1 capsule (0.5 mcg total) by mouth daily.  . carvedilol (COREG) 6.25 MG tablet Take 1 tablet (6.25 mg total) by mouth 2 (two) times daily with a meal.  . cholecalciferol (VITAMIN D) 25 MCG tablet Take 2 tablets (2,000 Units total) by mouth daily.  Marland Kitchen docusate sodium (COLACE) 100 MG capsule Take 1 capsule (100 mg total) by mouth 2 (two) times daily.  . Ensure (ENSURE) Take 237 mLs by mouth 2 (two) times daily between meals. To prevent malnutrition  . hydrALAZINE (APRESOLINE) 50 MG tablet Take 1 tablet (50 mg total) by mouth in the morning and at bedtime.  Marland Kitchen HYDROcodone-acetaminophen (NORCO/VICODIN) 5-325 MG tablet Take 1 tablet by mouth every 8 (eight) hours as needed for  severe pain.  . nitrofurantoin (MACRODANTIN) 100 MG capsule Take 100 mg by mouth at bedtime.  Marland Kitchen omeprazole (PRILOSEC) 20 MG capsule Take 20 mg by mouth daily.  Marland Kitchen oxybutynin (DITROPAN-XL) 10 MG 24 hr tablet Take 10 mg by mouth daily.  . sertraline (ZOLOFT) 50 MG tablet Take 75 mg by mouth daily. Give 1.5 tablets to = 75 mg  . simvastatin (ZOCOR) 40 MG tablet Take 1 tablet (40 mg total) by mouth daily.   No  facility-administered encounter medications on file as of 12/15/2020.    Review of Systems  GENERAL: No change in appetite, no fatigue, no weight changes, no fever, chills or weakness MOUTH and THROAT: Denies oral discomfort, gingival pain or bleeding, pain from teeth or hoarseness   RESPIRATORY: no cough, SOB, DOE, wheezing, hemoptysis CARDIAC: No chest pain or palpitations GI: No abdominal pain, diarrhea, constipation, heart burn, nausea or vomiting GU: Denies dysuria, frequency, hematuria, incontinence, or discharge NEUROLOGICAL: Denies dizziness, syncope, numbness, or headache PSYCHIATRIC: Denies feelings of depression or anxiety. No report of hallucinations, insomnia, paranoia, or agitation   Immunization History  Administered Date(s) Administered  . Influenza Split 09/29/2008, 10/10/2010, 09/28/2013  . Influenza, High Dose Seasonal PF 11/04/2014, 10/20/2015, 12/02/2016, 10/15/2017, 12/15/2018  . Influenza-Unspecified 11/02/2020  . Moderna Sars-Covid-2 Vaccination 03/10/2020  . PFIZER SARS-COV-2 Vaccination 04/07/2020  . PPD Test 10/10/2010  . Pneumococcal Conjugate-13 09/29/2008, 08/01/2015  . Pneumococcal Polysaccharide-23 10/15/2017  . Td 08/01/2015   Pertinent  Health Maintenance Due  Topic Date Due  . DEXA SCAN  Never done  . INFLUENZA VACCINE  Completed  . PNA vac Low Risk Adult  Completed   No flowsheet data found.   Vitals:   12/15/20 0950  BP: 135/66  Pulse: 74  Resp: 20  Temp: (!) 97.1 F (36.2 C)  SpO2: 93%  Weight: 180 lb 3.2 oz (81.7 kg)  Height: 5\' 3"  (1.6 m)   Body mass index is 31.92 kg/m.  Physical Exam  GENERAL APPEARANCE: Well nourished. In no acute distress. Obese. SKIN:  Skin is warm and dry.  MOUTH and THROAT: Lips are without lesions. Oral mucosa is moist and without lesions. Tongue is normal in shape, size, and color and without lesions RESPIRATORY: Breathing is even & unlabored, BS CTAB CARDIAC: RRR, no murmur,no extra heart  sounds, BLE 1+edema GI: Abdomen soft, normal BS, no masses, no tenderness EXTREMITIES:  Able to move X 4 extremities NEUROLOGICAL: There is no tremor. Speech is clear. Alert to self, disoriented to time and place. PSYCHIATRIC:  Affect and behavior are appropriate  Labs reviewed: Recent Labs    10/24/20 0457 10/25/20 0432 10/26/20 0344 11/22/20 0000  NA 137 138 139 139  K 4.0 3.5 4.0 4.1  CL 103 102 102 102  CO2 26 23 30  24*  GLUCOSE 155* 143* 138*  --   BUN 10 13 16 9   CREATININE 0.63 0.67 0.67 0.5  CALCIUM 8.7* 8.4* 9.0 9.4  MG 2.0 2.0 2.1  --    Recent Labs    10/24/20 0457 10/25/20 0432 10/26/20 0344 12/12/20 1543  AST 17 14* 19  --   ALT 12 10 12   --   ALKPHOS 81 69 63  --   BILITOT 0.7 0.9 0.6  --   PROT 6.7 6.3* 6.2* 6.7  ALBUMIN 3.1* 2.8* 2.7*  --    Recent Labs    10/24/20 0457 10/25/20 0432 10/26/20 0344 11/22/20 0000  WBC 12.6* 12.3* 12.1* 7.3  NEUTROABS 10.5* 8.8* 7.6  4.70  HGB 13.1 11.7* 11.8* 12.3  HCT 40.1 36.7 37.6 36  MCV 87.7 87.6 88.9  --   PLT 233 224 215 231   Lab Results  Component Value Date   TSH 2.560 10/22/2020    Assessment/Plan  1. Essential hypertension -Stable, nitrofurantoin carvedilol and hydralazine  2. OAB (overactive bladder) -Stable, continue nitrofurantoin  3. Major depression, recurrent, chronic (HCC) -Mood is stable, continue sertraline  4. Closed displaced transcondylar fracture of right humerus, sequela -S/P ORIF on 10/23/2020, follow-up with orthopedics -Continue PT and OT, for therapeutic and strengthening exercises -Continue PRN Norco for pain  5. Closed fracture of olecranon process of left ulna, sequela -   S/P ORIF and right ulnar nerve neuroplasty on 10/23/2020 -    Continue PT and OT, for therapeutic and strengthening exercises -Follow-up with orthopedics  6. Recurrent UTI -  No dysuria, continue nitrofurantoin      Family/ staff Communication: Discussed plan of care with resident and  charge nurse.  Labs/tests ordered: None  Goals of care:   Short-term care   Kenard Gower, DNP, MSN, FNP-BC [facility] [phone] (Monday-Friday 8:00 a.m. - 5:00 p.m.) [phone] (after hours)

## 2020-12-18 DIAGNOSIS — S42471D Displaced transcondylar fracture of right humerus, subsequent encounter for fracture with routine healing: Secondary | ICD-10-CM | POA: Diagnosis not present

## 2020-12-18 DIAGNOSIS — S52022A Displaced fracture of olecranon process without intraarticular extension of left ulna, initial encounter for closed fracture: Secondary | ICD-10-CM | POA: Diagnosis not present

## 2020-12-20 ENCOUNTER — Non-Acute Institutional Stay (SKILLED_NURSING_FACILITY): Payer: PPO | Admitting: Adult Health

## 2020-12-20 ENCOUNTER — Encounter: Payer: Self-pay | Admitting: Adult Health

## 2020-12-20 DIAGNOSIS — E782 Mixed hyperlipidemia: Secondary | ICD-10-CM

## 2020-12-20 DIAGNOSIS — N390 Urinary tract infection, site not specified: Secondary | ICD-10-CM | POA: Diagnosis not present

## 2020-12-20 DIAGNOSIS — F339 Major depressive disorder, recurrent, unspecified: Secondary | ICD-10-CM

## 2020-12-20 DIAGNOSIS — N3281 Overactive bladder: Secondary | ICD-10-CM

## 2020-12-20 DIAGNOSIS — F4323 Adjustment disorder with mixed anxiety and depressed mood: Secondary | ICD-10-CM | POA: Diagnosis not present

## 2020-12-20 DIAGNOSIS — I10 Essential (primary) hypertension: Secondary | ICD-10-CM | POA: Diagnosis not present

## 2020-12-20 DIAGNOSIS — S42471S Displaced transcondylar fracture of right humerus, sequela: Secondary | ICD-10-CM | POA: Diagnosis not present

## 2020-12-20 DIAGNOSIS — S52022S Displaced fracture of olecranon process without intraarticular extension of left ulna, sequela: Secondary | ICD-10-CM | POA: Diagnosis not present

## 2020-12-20 NOTE — Progress Notes (Signed)
Location:  [facility] Room Number: 112-A Place of Service:  SNF (31) Provider:  Kenard Gower, DNP, FNP-BC  Patient Care Team: [name], MD as PCP - General (Family Medicine) Rollene Rotunda, MD as PCP - Cardiology (Cardiology)  Extended Emergency Contact Information Primary Emergency Contact: Linus Mako Mobile Phone: [phone] Relation: Daughter Interpreter needed? No  Code Status:  FULL CODE  Goals of care: Advanced Directive information Advanced Directives 12/15/2020  Does Patient Have a Medical Advance Directive? No  Does patient want to make changes to medical advance directive? -  Would patient like information on creating a medical advance directive? No - Patient declined     Chief Complaint  Patient presents with  . Discharge Note    Patient is seen for discharge home on 12/21/20    HPI:  Pt is an 84 y.o. female who is for discharge home to [facility] on 12/21/20 with Home health PT and OT.  She was admitted to [facility] on 10/26/20 pos hospitalization 10/21/2020 to 10/26/2020 for olecranon process fracture of the left elbow as well as transcondylar fracture of the distal right humerus sustained from a fall at home.  She was initially brought to [facility] and was recommended to be transferred to [facility].  Orthopedics was consulted and underwent ORIF of right transcondylar distal humerus fracture, right ulnar nerve neuroplasty and ORIF of left distal humerus fracture (Left) olecranon fracture performed by Dr. Myrene Galas on 10/23/2020.  She has a PMH of Takotsubo cardiomyopathy, depression with anxiety, GERD and dyslipidemia.  While at [facility], she had COVID-19 infection and completed treatment. Sh is fully vaccinated with Moderna COVID-19 vaccine, 2/2.  Patient was admitted to this facility for short-term rehabilitation after the patient's recent  hospitalization.  Patient has completed SNF rehabilitation and therapy has cleared the patient for discharge.   Past Medical History:  Diagnosis Date  . Cardiomyopathy (HCC)    Takotsubo  . Depression with anxiety   . Dyslipidemia   . GERD (gastroesophageal reflux disease)    Past Surgical History:  Procedure Laterality Date  . CHOLECYSTECTOMY    . LEG SURGERY     Metal rod left leg  . ORIF ELBOW FRACTURE Right 10/23/2020   Procedure: OPEN REDUCTION INTERNAL FIXATION (ORIF) ELBOW/OLECRANON FRACTURE;  Surgeon: Myrene Galas, MD;  Location: [facility] OR;  Service: Orthopedics;  Laterality: Right;  . ORIF HUMERUS FRACTURE Left 10/23/2020   Procedure: OPEN REDUCTION INTERNAL FIXATION LEFT DISTAL HUMERUS FRACTURE;  Surgeon: Myrene Galas, MD;  Location: [facility] OR;  Service: Orthopedics;  Laterality: Left;  . TOTAL KNEE ARTHROPLASTY     Bilateral    Allergies  Allergen Reactions  . Codeine Sulfate [Codeine] Other (See Comments)    drowsey  . Sulfa Antibiotics Other (See Comments)    drowsey    Outpatient Encounter Medications as of 12/20/2020  Medication Sig  . acetaminophen (TYLENOL) 500 MG tablet Take 1 tablet (500 mg total) by mouth every 6 (six) hours as needed for moderate pain.  . bisacodyl (DULCOLAX) 5 MG EC tablet Take 1 tablet (5 mg total) by mouth daily as needed for moderate constipation.  . calcitRIOL (ROCALTROL) 0.5 MCG capsule Take 1 capsule (0.5 mcg total) by mouth daily.  . carvedilol (COREG) 6.25 MG tablet Take 1 tablet (6.25 mg total) by mouth 2 (two) times daily with a meal.  . cholecalciferol (VITAMIN D) 25 MCG tablet Take 2 tablets (2,000 Units total) by mouth  daily.  . docusate sodium (COLACE) 100 MG capsule Take 1 capsule (100 mg total) by mouth 2 (two) times daily.  . Ensure (ENSURE) Take 237 mLs by mouth 2 (two) times daily between meals. To prevent malnutrition  . hydrALAZINE (APRESOLINE) 50 MG tablet Take 1 tablet (50 mg total) by mouth in the morning and at  bedtime.  Marland Kitchen HYDROcodone-acetaminophen (NORCO/VICODIN) 5-325 MG tablet Take 1 tablet by mouth every 8 (eight) hours as needed for severe pain.  . nitrofurantoin (MACRODANTIN) 100 MG capsule Take 100 mg by mouth at bedtime.  Marland Kitchen omeprazole (PRILOSEC) 20 MG capsule Take 20 mg by mouth daily.  Marland Kitchen oxybutynin (DITROPAN-XL) 10 MG 24 hr tablet Take 10 mg by mouth daily.  . sertraline (ZOLOFT) 50 MG tablet Take 75 mg by mouth daily. Give 1.5 tablets to = 75 mg  . simvastatin (ZOCOR) 40 MG tablet Take 1 tablet (40 mg total) by mouth daily.   No facility-administered encounter medications on file as of 12/20/2020.    Review of Systems  GENERAL: No change in appetite, no fatigue, no weight changes, no fever, chills or weakness MOUTH and THROAT: Denies oral discomfort, gingival pain or bleeding, pain from teeth or hoarseness   RESPIRATORY: no cough, SOB, DOE, wheezing, hemoptysis CARDIAC: No chest pain or palpitations GI: No abdominal pain, diarrhea, constipation, heart burn, nausea or vomiting GU: Denies dysuria, frequency, hematuria, incontinence, or discharge NEUROLOGICAL: Denies dizziness, syncope, numbness, or headache PSYCHIATRIC: Denies feelings of depression or anxiety. No report of hallucinations, insomnia, paranoia, or agitation   Immunization History  Administered Date(s) Administered  . Influenza Split 09/29/2008, 10/10/2010, 09/28/2013  . Influenza, High Dose Seasonal PF 11/04/2014, 10/20/2015, 12/02/2016, 10/15/2017, 12/15/2018  . Influenza-Unspecified 11/02/2020  . Moderna Sars-Covid-2 Vaccination 03/10/2020  . PFIZER SARS-COV-2 Vaccination 04/07/2020  . PPD Test 10/10/2010  . Pneumococcal Conjugate-13 09/29/2008, 08/01/2015  . Pneumococcal Polysaccharide-23 10/15/2017  . Td 08/01/2015   Pertinent  Health Maintenance Due  Topic Date Due  . DEXA SCAN  Never done  . INFLUENZA VACCINE  Completed  . PNA vac Low Risk Adult  Completed    Vitals:   12/20/20 1455  BP: 117/71   Pulse: 83  Resp: 18  Temp: 97.7 F (36.5 C)  TempSrc: Oral  Weight: 175 lb (79.4 kg)  Height: 5\' 3"  (1.6 m)   Body mass index is 31 kg/m.  Physical Exam  GENERAL APPEARANCE: Well nourished. In no acute distress. Normal body habitus SKIN:  Skin is warm and dry.  MOUTH and THROAT: Lips are without lesions. Oral mucosa is moist and without lesions. Tongue is normal in shape, size, and color and without lesions RESPIRATORY: Breathing is even & unlabored, BS CTAB CARDIAC: RRR, no murmur,no extra heart sounds, BLE 1+ edema GI: Abdomen soft, normal BS, no masses, no tenderness EXTREMITIES:  Able to move X 4 extremities NEUROLOGICAL: There is no tremor. Speech is clear. Alert to self and place, disoriented to time. PSYCHIATRIC:  Affect and behavior are appropriate  Labs reviewed: Recent Labs    10/24/20 0457 10/25/20 0432 10/26/20 0344 11/22/20 0000  NA 137 138 139 139  K 4.0 3.5 4.0 4.1  CL 103 102 102 102  CO2 26 23 30  24*  GLUCOSE 155* 143* 138*  --   BUN 10 13 16 9   CREATININE 0.63 0.67 0.67 0.5  CALCIUM 8.7* 8.4* 9.0 9.4  MG 2.0 2.0 2.1  --    Recent Labs    10/24/20 0457 10/25/20 0432 10/26/20 0344  12/12/20 1543  AST 17 14* 19  --   ALT 12 10 12   --   ALKPHOS 81 69 63  --   BILITOT 0.7 0.9 0.6  --   PROT 6.7 6.3* 6.2* 6.7  ALBUMIN 3.1* 2.8* 2.7*  --    Recent Labs    10/24/20 0457 10/25/20 0432 10/26/20 0344 11/22/20 0000  WBC 12.6* 12.3* 12.1* 7.3  NEUTROABS 10.5* 8.8* 7.6 4.70  HGB 13.1 11.7* 11.8* 12.3  HCT 40.1 36.7 37.6 36  MCV 87.7 87.6 88.9  --   PLT 233 224 215 231   Lab Results  Component Value Date   TSH 2.560 10/22/2020    Assessment/Plan  1. Closed displaced transcondylar fracture of right humerus, sequela -  S/P ORIF on 10/23/2020, follow-up with orthopedics -For home health PT/OT, for therapeutic and strengthening exercises  2. Closed fracture of olecranon process of left ulna, sequela -  S/P ORIF and right ulnar nerve  neuroplasty on 10/23/2020, follow-up with orthopedics -For home health PT and OT, for therapeutic strengthening exercises - HYDROcodone-acetaminophen (NORCO/VICODIN) 5-325 MG tablet; Take 1 tablet by mouth every 8 (eight) hours as needed for severe pain.  Dispense: 10 tablet; Refill: 0  3. Essential hypertension - carvedilol (COREG) 6.25 MG tablet; Take 1 tablet (6.25 mg total) by mouth 2 (two) times daily with a meal.  Dispense: 60 tablet; Refill: 0 - hydrALAZINE (APRESOLINE) 50 MG tablet; Take 1 tablet (50 mg total) by mouth in the morning and at bedtime.  Dispense: 60 tablet; Refill: 0  4. OAB (overactive bladder) - oxybutynin (DITROPAN-XL) 10 MG 24 hr tablet; Take 1 tablet (10 mg total) by mouth daily.  Dispense: 30 tablet; Refill: 0  5. Major depression, recurrent, chronic (HCC) - sertraline (ZOLOFT) 50 MG tablet; Take 1.5 tablets (75 mg total) by mouth daily. Give 1.5 tablets to = 75 mg  Dispense: 45 tablet; Refill: 0  6. Recurrent UTI - nitrofurantoin (MACRODANTIN) 100 MG capsule; Take 1 capsule (100 mg total) by mouth at bedtime.  Dispense: 30 capsule; Refill: 0  7. Mixed hyperlipidemia -Continue simvastatin 40 mg 1 tab daily    I have filled out patient's discharge paperwork and written prescriptions.  Patient will receive home health PT and OT.  DME provided:  Rolling walker  Total discharge time: Greater than 30 minutes Greater than 50% spent in counseling and coordination of care.   Discharge time involved coordination of the discharge process with social worker, nursing staff and therapy department. Medical justification for home health services/DME verified.    10/25/2020, DNP, MSN, FNP-BC [facility] [phone] (Monday-Friday 8:00 a.m. - 5:00 p.m.) [phone] (after hours)

## 2020-12-21 MED ORDER — NITROFURANTOIN MACROCRYSTAL 100 MG PO CAPS: 100.0000 mg | ORAL_CAPSULE | Freq: Every day | ORAL | 0 refills | Status: AC

## 2020-12-21 MED ORDER — CALCITRIOL 0.5 MCG PO CAPS
0.5000 ug | ORAL_CAPSULE | Freq: Every day | ORAL | 0 refills | Status: DC
Start: 2020-12-21 — End: 2021-01-30

## 2020-12-21 MED ORDER — OMEPRAZOLE 20 MG PO CPDR: 20.0000 mg | DELAYED_RELEASE_CAPSULE | Freq: Every day | ORAL | 0 refills | Status: AC

## 2020-12-21 MED ORDER — CARVEDILOL 6.25 MG PO TABS: 6.2500 mg | ORAL_TABLET | Freq: Two times a day (BID) | ORAL | 0 refills | Status: DC

## 2020-12-21 MED ORDER — SERTRALINE HCL 50 MG PO TABS: 75.0000 mg | ORAL_TABLET | Freq: Every day | ORAL | 0 refills | Status: AC

## 2020-12-21 MED ORDER — HYDROCODONE-ACETAMINOPHEN 5-325 MG PO TABS: 1.0000 | ORAL_TABLET | Freq: Three times a day (TID) | ORAL | 0 refills | Status: DC | PRN

## 2020-12-21 MED ORDER — OXYBUTYNIN CHLORIDE ER 10 MG PO TB24: 10.0000 mg | ORAL_TABLET | Freq: Every day | ORAL | 0 refills | Status: AC

## 2020-12-21 MED ORDER — HYDRALAZINE HCL 50 MG PO TABS: 50.0000 mg | ORAL_TABLET | Freq: Two times a day (BID) | ORAL | 0 refills | Status: DC

## 2020-12-21 MED ORDER — SIMVASTATIN 40 MG PO TABS: 40.0000 mg | ORAL_TABLET | Freq: Every day | ORAL | 0 refills | Status: AC

## 2020-12-25 DIAGNOSIS — R413 Other amnesia: Secondary | ICD-10-CM | POA: Diagnosis not present

## 2020-12-25 DIAGNOSIS — F321 Major depressive disorder, single episode, moderate: Secondary | ICD-10-CM | POA: Diagnosis not present

## 2020-12-25 DIAGNOSIS — S52022A Displaced fracture of olecranon process without intraarticular extension of left ulna, initial encounter for closed fracture: Secondary | ICD-10-CM | POA: Diagnosis not present

## 2020-12-25 DIAGNOSIS — S42401A Unspecified fracture of lower end of right humerus, initial encounter for closed fracture: Secondary | ICD-10-CM | POA: Diagnosis not present

## 2020-12-25 DIAGNOSIS — W19XXXD Unspecified fall, subsequent encounter: Secondary | ICD-10-CM | POA: Diagnosis not present

## 2020-12-25 DIAGNOSIS — I5022 Chronic systolic (congestive) heart failure: Secondary | ICD-10-CM | POA: Diagnosis not present

## 2020-12-26 DIAGNOSIS — S42402D Unspecified fracture of lower end of left humerus, subsequent encounter for fracture with routine healing: Secondary | ICD-10-CM | POA: Diagnosis not present

## 2020-12-26 DIAGNOSIS — S42401D Unspecified fracture of lower end of right humerus, subsequent encounter for fracture with routine healing: Secondary | ICD-10-CM | POA: Diagnosis not present

## 2020-12-26 DIAGNOSIS — Z9181 History of falling: Secondary | ICD-10-CM | POA: Diagnosis not present

## 2021-01-02 DIAGNOSIS — N3942 Incontinence without sensory awareness: Secondary | ICD-10-CM | POA: Diagnosis not present

## 2021-01-02 DIAGNOSIS — N3944 Nocturnal enuresis: Secondary | ICD-10-CM | POA: Diagnosis not present

## 2021-01-09 DIAGNOSIS — S42401D Unspecified fracture of lower end of right humerus, subsequent encounter for fracture with routine healing: Secondary | ICD-10-CM | POA: Diagnosis not present

## 2021-01-09 DIAGNOSIS — S42402D Unspecified fracture of lower end of left humerus, subsequent encounter for fracture with routine healing: Secondary | ICD-10-CM | POA: Diagnosis not present

## 2021-01-09 DIAGNOSIS — Z9181 History of falling: Secondary | ICD-10-CM | POA: Diagnosis not present

## 2021-01-12 DIAGNOSIS — H353211 Exudative age-related macular degeneration, right eye, with active choroidal neovascularization: Secondary | ICD-10-CM | POA: Diagnosis not present

## 2021-01-13 ENCOUNTER — Other Ambulatory Visit: Payer: Self-pay | Admitting: Adult Health

## 2021-01-13 DIAGNOSIS — I10 Essential (primary) hypertension: Secondary | ICD-10-CM

## 2021-01-13 DIAGNOSIS — F339 Major depressive disorder, recurrent, unspecified: Secondary | ICD-10-CM

## 2021-01-17 DIAGNOSIS — N3942 Incontinence without sensory awareness: Secondary | ICD-10-CM | POA: Diagnosis not present

## 2021-01-17 DIAGNOSIS — N390 Urinary tract infection, site not specified: Secondary | ICD-10-CM | POA: Diagnosis not present

## 2021-01-30 ENCOUNTER — Ambulatory Visit: Payer: PPO | Admitting: Neurology

## 2021-01-30 ENCOUNTER — Other Ambulatory Visit: Payer: Self-pay

## 2021-01-30 ENCOUNTER — Encounter: Payer: Self-pay | Admitting: Neurology

## 2021-01-30 VITALS — BP 135/79 | HR 72 | Ht 63.0 in | Wt 182.0 lb

## 2021-01-30 DIAGNOSIS — R413 Other amnesia: Secondary | ICD-10-CM

## 2021-01-30 MED ORDER — MEMANTINE HCL 10 MG PO TABS: ORAL_TABLET | ORAL | 11 refills | Status: DC

## 2021-01-30 NOTE — Progress Notes (Signed)
NEUROLOGY CONSULTATION NOTE  Kayla Carrillo MRN: [id number] DOB: March 02, 1936  Referring provider: Dr. [name] Primary care provider: Dr. [name]  Reason for consult:  Memory loss  Dear Dr Docia Chuck:  Thank you for your kind referral of Kayla Carrillo for consultation of the above symptoms. Although her history is well known to you, please allow me to reiterate it for the purpose of our medical record. The patient was accompanied to the clinic by her ex-daughter-in-law Kayla Carrillo who also provides collateral information. Records and images were personally reviewed where available.  Kayla Carrillo ex-DIL  HISTORY OF PRESENT ILLNESS: This is a pleasant 85 year old right-handed woman with a history of hyperlipidemia, CHF,Takotsubo cardiomyopathy, depression, urinary incontinence, presenting for evaluation of memory loss. Her ex-daughter-in-law Kayla Carrillo is present to provide additional information. When asked about her memory, she states that she cannot remember names, making her mad at herself. She has word-finding difficulties also noted on today's visit. Kayla Carrillo started noticing changes in 2019 while she was living in [place]. There was a lot of stress at that time, she was in the middle of a divorce at age 63 and had to sell her house. She moved in with her ex-son-in-law in [place] but had stress when her daughter also came to stay. She moved to [facility] 6 months ago, there is less stress, however Kayla Carrillo continues to note cognitive issues. She has gotten lost driving and called Kayla Carrillo 3 times in the past 2 years. She got lost driving to her great grandson's birthday 7 months ago. The last time she drove in September 2021 she was on the wrong side of the road. She forgets her medications, Kayla Carrillo started fixing her pillbox and noticed this morning that she took her night medications in the morning yesterday. Most of her bills are on autopay. She does not cook. She states  she gets depressed and that maybe her Zoloft helps. No paranoia or hallucinations.   She has bladder and bowel incontinence, stating she can "pee up a storm." She wears adult diapers. She washes her sheets daily due to the incontinence, no issues using the washing machine. She has dizziness when standing. She has had a lot of falls, she She had a fall in 09/2020 and admitted for left elbow and right humerus fracture s/p ORIF in 09/2020. At that time she had a head CT without contrast which I personally reviewed, no acute changes, there was mild diffuse atrophy and moderate chronic microvascular disease. She has occasional double vision. Her neck is stiff sometimes. She has numbness in her right hand and soles of both feet. She states she had lost her sense of smell after taking cough medication years ago. No headaches, dysarthria/dysphagia, back pain, tremors. No family history of dementia. She has had falls where she hit her head, no loss of consciousness. She rarely drinks alcohol.   Laboratory Data: 07/2020: TSH 1.83, B12 330  PAST MEDICAL HISTORY: Past Medical History:  Diagnosis Date  . Cardiomyopathy (HCC)    Takotsubo  . Depression with anxiety   . Dyslipidemia   . GERD (gastroesophageal reflux disease)     PAST SURGICAL HISTORY: Past Surgical History:  Procedure Laterality Date  . CHOLECYSTECTOMY    . LEG SURGERY     Metal rod left leg  . ORIF ELBOW FRACTURE Right 10/23/2020   Procedure: OPEN REDUCTION INTERNAL FIXATION (ORIF) ELBOW/OLECRANON FRACTURE;  Surgeon: Myrene Galas, MD;  Location: [facility] OR;  Service: Orthopedics;  Laterality: Right;  . ORIF  HUMERUS FRACTURE Left 10/23/2020   Procedure: OPEN REDUCTION INTERNAL FIXATION LEFT DISTAL HUMERUS FRACTURE;  Surgeon: Myrene Galas, MD;  Location: [facility] OR;  Service: Orthopedics;  Laterality: Left;  . TOTAL KNEE ARTHROPLASTY     Bilateral    MEDICATIONS: Current Outpatient Medications on File Prior to Visit  Medication Sig  Dispense Refill  . acetaminophen (TYLENOL) 500 MG tablet Take 1 tablet (500 mg total) by mouth every 6 (six) hours as needed for moderate pain. 100 tablet 2  . bisacodyl (DULCOLAX) 5 MG EC tablet Take 1 tablet (5 mg total) by mouth daily as needed for moderate constipation. 30 tablet 0  . calcitRIOL (ROCALTROL) 0.5 MCG capsule Take 1 capsule (0.5 mcg total) by mouth daily. 30 capsule 0  . carvedilol (COREG) 6.25 MG tablet Take 1 tablet (6.25 mg total) by mouth 2 (two) times daily with a meal. 60 tablet 0  . cholecalciferol (VITAMIN D) 25 MCG tablet Take 2 tablets (2,000 Units total) by mouth daily.    Marland Kitchen docusate sodium (COLACE) 100 MG capsule Take 1 capsule (100 mg total) by mouth 2 (two) times daily. 10 capsule 0  . Ensure (ENSURE) Take 237 mLs by mouth 2 (two) times daily between meals. To prevent malnutrition    . hydrALAZINE (APRESOLINE) 50 MG tablet Take 1 tablet (50 mg total) by mouth in the morning and at bedtime. 60 tablet 0  . HYDROcodone-acetaminophen (NORCO/VICODIN) 5-325 MG tablet Take 1 tablet by mouth every 8 (eight) hours as needed for severe pain. 10 tablet 0  . nitrofurantoin (MACRODANTIN) 100 MG capsule Take 1 capsule (100 mg total) by mouth at bedtime. 30 capsule 0  . omeprazole (PRILOSEC) 20 MG capsule Take 1 capsule (20 mg total) by mouth daily. 30 capsule 0  . oxybutynin (DITROPAN-XL) 10 MG 24 hr tablet Take 1 tablet (10 mg total) by mouth daily. 30 tablet 0  . sertraline (ZOLOFT) 50 MG tablet Take 1.5 tablets (75 mg total) by mouth daily. Give 1.5 tablets to = 75 mg 45 tablet 0  . simvastatin (ZOCOR) 40 MG tablet Take 1 tablet (40 mg total) by mouth daily. 30 tablet 0   No current facility-administered medications on file prior to visit.    ALLERGIES: Allergies  Allergen Reactions  . Codeine Sulfate [Codeine] Other (See Comments)    drowsey  . Sulfa Antibiotics Other (See Comments)    drowsey    FAMILY HISTORY: Family History  Family history unknown: Yes     SOCIAL HISTORY: Social History   Socioeconomic History  . Marital status: Single    Spouse name: Not on file  . Number of children: Not on file  . Years of education: Not on file  . Highest education level: Not on file  Occupational History  . Not on file  Tobacco Use  . Smoking status: Never Smoker  . Smokeless tobacco: Never Used  Vaping Use  . Vaping Use: Never used  Substance and Sexual Activity  . Alcohol use: Yes    Comment: rare  . Drug use: Never  . Sexual activity: Not on file  Other Topics Concern  . Not on file  Social History Narrative   Four children. Husband lives with [place] with his daughters.     Right handed    Lives alone at home   Social Determinants of Health   Financial Resource Strain: Not on file  Food Insecurity: Not on file  Transportation Needs: Not on file  Physical Activity: Not on file  Stress: Not on file  Social Connections: Not on file  Intimate Partner Violence: Not on file     PHYSICAL EXAM: Vitals:   01/30/21 1020  BP: 135/79  Pulse: 72  SpO2: 98%   General: No acute distress Head:  Normocephalic/atraumatic Skin/Extremities: No rash, no edema Neurological Exam: Mental status: alert and oriented to person, place, month/year. No dysarthria or aphasia, Fund of knowledge is appropriate.  Recent and remote memory are impaired. Attention and concentration are reduced. Able to name objects, difficulty with repetition. MOCA score 13/30 Montreal Cognitive Assessment  01/30/2021  Visuospatial/ Executive (0/5) 0  Naming (0/3) 3  Attention: Read list of digits (0/2) 1  Attention: Read list of letters (0/1) 1  Attention: Serial 7 subtraction starting at 100 (0/3) 1  Language: Repeat phrase (0/2) 0  Language : Fluency (0/1) 0  Abstraction (0/2) 2  Delayed Recall (0/5) 1  Orientation (0/6) 4  Total 13  Adjusted Score (based on education) 13    Cranial nerves: CN I: not tested CN II: pupils equal, round and reactive to light,  visual fields intact CN III, IV, VI:  full range of motion, no nystagmus, no ptosis CN V: facial sensation intact CN VII: upper and lower face symmetric CN VIII: hearing intact to conversation CN XI: sternocleidomastoid and trapezius muscles intact Bulk & Tone: normal, no fasciculations. Motor: 5/5 throughout with no pronator drift. Sensation: intact to light touch, cold Deep Tendon Reflexes: +1 throughout Cerebellar: no incoordination on finger to nose testing Gait: slow and cautious with walker, no ataxia, no magnetic gait noted Tremor: none  IMPRESSION: This is a pleasant 85 year old right-handed woman with a history of hyperlipidemia, CHF,Takotsubo cardiomyopathy, depression, urinary incontinence, presenting for evaluation of memory loss. Her neurological exam is non-focal, MOCA score today 13/30. Family reports difficulties with complex tasks, we discussed symptoms concerning for dementia. She will be scheduled for MRI brain without contrast and Neurocognitive testing to further evaluate cognitive changes. We discussed starting Memantine, would avoid cholinesterase inhibitors due to cardiac issues, side effects and expectations discussed. Start Memantine 10mg  qhs x 2 weeks, then increase to 1 tab BID. Discussed increased supervision with medications and advanced care planning, including evaluating her POAs. She does not drive. Continue close supervision. We discussed the importance of control of vascular risk factors, physical exercise, and brain stimulation exercises for brain health. Follow-up in 6-8 months, they know to call for any changes.   Thank you for allowing me to participate in the care of this patient. Please do not hesitate to call for any questions or concerns.   , M.D.  CC: Dr. Patrcia Dolly

## 2021-01-30 NOTE — Patient Instructions (Signed)
1. Schedule MRI brain without contrast  2. Schedule Neurocognitive testing  3. Start Memantine (Namenda) 10mg : Take 1 tablet every night for 2 weeks, then increase to 1 tablet twice a day  4. Recommend having more help with medications  5. Follow-up in 6-8 months, call for any changes   You have been referred for a neuropsychological evaluation (i.e., evaluation of memory and thinking abilities). Please bring someone with you to this appointment if possible, as it is helpful for the doctor to hear from both you and another adult who knows you well. Please bring eyeglasses and hearing aids if you wear them.    The evaluation will take approximately 3 hours and has two parts:   . The first part is a clinical interview with the neuropsychologist (Dr. or Dr. Milbert Coulter). During the interview, the neuropsychologist will speak with you and the individual you brought to the appointment.    . The second part of the evaluation is testing with the doctor's technician Roseanne Reno or Annabelle Harman). During the testing, the technician will ask you to remember different types of material, solve problems, and answer some questionnaires. Your family member will not be present for this portion of the evaluation.   Please note: We must reserve several hours of the neuropsychologist's time and the psychometrician's time for your evaluation appointment. As such, there is a No-Show fee of $100. If you are unable to attend any of your appointments, please contact our office as soon as possible to reschedule.   FALL PRECAUTIONS: Be cautious when walking. Scan the area for obstacles that may increase the risk of trips and falls. When getting up in the mornings, sit up at the edge of the bed for a few minutes before getting out of bed. Consider elevating the bed at the head end to avoid drop of blood pressure when getting up. Walk always in a well-lit room (use night lights in the walls). Avoid area rugs or power cords from  appliances in the middle of the walkways. Use a walker or a cane if necessary and consider physical therapy for balance exercise. Get your eyesight checked regularly.  FINANCIAL OVERSIGHT: Supervision, especially oversight when making financial decisions or transactions is also recommended.  HOME SAFETY: Consider the safety of the kitchen when operating appliances like stoves, microwave oven, and blender. Consider having supervision and share cooking responsibilities until no longer able to participate in those. Accidents with firearms and other hazards in the house should be identified and addressed as well.  ABILITY TO BE LEFT ALONE: If patient is unable to contact 911 operator, consider using LifeLine, or when the need is there, arrange for someone to stay with patients. Smoking is a fire hazard, consider supervision or cessation. Risk of wandering should be assessed by caregiver and if detected at any point, supervision and safe proof recommendations should be instituted.  MEDICATION SUPERVISION: Inability to self-administer medication needs to be constantly addressed. Implement a mechanism to ensure safe administration of the medications.  RECOMMENDATIONS FOR ALL PATIENTS WITH MEMORY PROBLEMS: 1. Continue to exercise (Recommend 30 minutes of walking everyday, or 3 hours every week) 2. Increase social interactions - continue going to [place] and enjoy social gatherings with friends and family 3. Eat healthy, avoid fried foods and eat more fruits and vegetables 4. Maintain adequate blood pressure, blood sugar, and blood cholesterol level. Reducing the risk of stroke and cardiovascular disease also helps promoting better memory. 5. Avoid stressful situations. Live a simple life and avoid  aggravations. Organize your time and prepare for the next day in anticipation. 6. Sleep well, avoid any interruptions of sleep and avoid any distractions in the bedroom that may interfere with adequate sleep  quality 7. Avoid sugar, avoid sweets as there is a strong link between excessive sugar intake, diabetes, and cognitive impairment We discussed the Mediterranean diet, which has been shown to help patients reduce the risk of progressive memory disorders and reduces cardiovascular risk. This includes eating fish, eat fruits and green leafy vegetables, nuts like almonds and hazelnuts, walnuts, and also use olive oil. Avoid fast foods and fried foods as much as possible. Avoid sweets and sugar as sugar use has been linked to worsening of memory function.  There is always a concern of gradual progression of memory problems. If this is the case, then we may need to adjust level of care according to patient needs. Support, both to the patient and caregiver, should then be put into place.

## 2021-02-14 DIAGNOSIS — H353211 Exudative age-related macular degeneration, right eye, with active choroidal neovascularization: Secondary | ICD-10-CM | POA: Diagnosis not present

## 2021-02-17 ENCOUNTER — Other Ambulatory Visit: Payer: Self-pay

## 2021-02-17 ENCOUNTER — Ambulatory Visit
Admission: RE | Admit: 2021-02-17 | Discharge: 2021-02-17 | Disposition: A | Discharge status: Home / Self Care | Payer: PPO | Source: Ambulatory Visit | Attending: Neurology | Admitting: Neurology

## 2021-02-17 DIAGNOSIS — R413 Other amnesia: Secondary | ICD-10-CM | POA: Diagnosis not present

## 2021-02-17 DIAGNOSIS — I6782 Cerebral ischemia: Secondary | ICD-10-CM | POA: Diagnosis not present

## 2021-02-17 DIAGNOSIS — M1288 Other specific arthropathies, not elsewhere classified, other specified site: Secondary | ICD-10-CM | POA: Diagnosis not present

## 2021-02-17 DIAGNOSIS — F0390 Unspecified dementia without behavioral disturbance: Secondary | ICD-10-CM | POA: Diagnosis not present

## 2021-02-20 ENCOUNTER — Telehealth: Payer: Self-pay

## 2021-02-20 NOTE — Telephone Encounter (Signed)
PT daughter in law called no answer left a voice mail to call the office back

## 2021-02-20 NOTE — Telephone Encounter (Signed)
-----   Message from Karen M Aquino, MD sent at 02/20/2021  2:51 PM EST ----- Pls let her know MRI brain did not show any evidence of tumor, stroke, or bleed. It showed age-related changes and hardening of the small blood vessels seen in patients with blood pressure, cholesterol, cardiac issues. Very important to continue control of these conditions. Proceed with memory testing as scheduled. Thanks 

## 2021-02-21 ENCOUNTER — Telehealth: Payer: Self-pay | Admitting: Neurology

## 2021-02-21 ENCOUNTER — Telehealth: Payer: Self-pay

## 2021-02-21 ENCOUNTER — Other Ambulatory Visit: Payer: Self-pay | Admitting: Neurology

## 2021-02-21 NOTE — Telephone Encounter (Signed)
Spoke with pt daughter in law MRI brain did not show any evidence of tumor, stroke, or bleed. It showed age-related changes and hardening of the small blood vessels seen in patients with blood pressure, cholesterol, cardiac issues. Very important to continue control of these conditions. Proceed with memory testing as scheduled

## 2021-02-21 NOTE — Telephone Encounter (Signed)
-----   Message from Van Clines, MD sent at 02/20/2021  2:51 PM EST ----- Pls let her know MRI brain did not show any evidence of tumor, stroke, or bleed. It showed age-related changes and hardening of the small blood vessels seen in patients with blood pressure, cholesterol, cardiac issues. Very important to continue control of these conditions. Proceed with memory testing as scheduled. Thanks

## 2021-02-21 NOTE — Telephone Encounter (Signed)
See result notes. 

## 2021-03-12 NOTE — Progress Notes (Signed)
Cardiology Office Note   Date:  03/13/2021   ID:  Kayla Carrillo, DOB August 05, 1936, MRN [id number]  PCP:  [name], MD  Cardiologist:   Rollene Rotunda, MD Referring:  [name], MD  Chief Complaint  Patient presents with  . LVH      History of Present Illness: Kayla Carrillo is a 85 y.o. female who is referred by [name], MD presents for cardiomyopathy.   She had a history of Takotsubo cardiomyopathy many years ago.  I saw her as a new patient and she was to get an echo but then the pandemic happened.  She had the echo in 2020 that  demonstrated a reduced EF of 45% but with severe LVH.    However, follow up in October of this year demonstrated the EF to be 50 - 55%.  There has been a mention of significant left ventricular hypertrophy but this was not identified at the last echo.  I had sent her for a PYP scan which was equivocal.   Myeloma panel was unremarkable.  I had seen her in December after she had a fall with fracturing of her wrists.  She had a rehab stay after that.  She has no new cardiovascular complaints except for dizziness.  This happens in the morning in particular when she stands up.  She ends up urinating not infrequently on herself because she has dizziness in the morning.  Her blood pressure today is low but they do not take it at home.  She has not had any frank syncope.  She does not have any shortness of breath, PND or orthopnea.  She has had no palpitations.   Past Medical History:  Diagnosis Date  . Cardiomyopathy (HCC)    Takotsubo  . Depression with anxiety   . Dyslipidemia   . GERD (gastroesophageal reflux disease)     Past Surgical History:  Procedure Laterality Date  . CHOLECYSTECTOMY    . LEG SURGERY     Metal rod left leg  . ORIF ELBOW FRACTURE Right 10/23/2020   Procedure: OPEN REDUCTION INTERNAL FIXATION (ORIF) ELBOW/OLECRANON FRACTURE;  Surgeon: Myrene Galas, MD;  Location: [facility] OR;  Service: Orthopedics;  Laterality:  Right;  . ORIF HUMERUS FRACTURE Left 10/23/2020   Procedure: OPEN REDUCTION INTERNAL FIXATION LEFT DISTAL HUMERUS FRACTURE;  Surgeon: Myrene Galas, MD;  Location: [facility] OR;  Service: Orthopedics;  Laterality: Left;  . TOTAL KNEE ARTHROPLASTY     Bilateral     Current Outpatient Medications  Medication Sig Dispense Refill  . carvedilol (COREG) 6.25 MG tablet Take 1 tablet (6.25 mg total) by mouth 2 (two) times daily with a meal. 60 tablet 0  . cholecalciferol (VITAMIN D) 25 MCG tablet Take 2 tablets (2,000 Units total) by mouth daily.    . Ensure (ENSURE) Take 237 mLs by mouth 2 (two) times daily between meals. To prevent malnutrition    . memantine (NAMENDA) 10 MG tablet TAKE 1 TABLET EVERY NIGHT FOR 2 WEEKS, THEN INCREASE TO 1 TABLET TWICE A DAY 180 tablet 1  . nitrofurantoin (MACRODANTIN) 100 MG capsule Take 1 capsule (100 mg total) by mouth at bedtime. 30 capsule 0  . omeprazole (PRILOSEC) 20 MG capsule Take 1 capsule (20 mg total) by mouth daily. 30 capsule 0  . oxybutynin (DITROPAN-XL) 10 MG 24 hr tablet Take 1 tablet (10 mg total) by mouth daily. 30 tablet 0  . sertraline (ZOLOFT) 50 MG tablet Take 1.5 tablets (75 mg total) by mouth daily.  Give 1.5 tablets to = 75 mg 45 tablet 0  . simvastatin (ZOCOR) 40 MG tablet Take 1 tablet (40 mg total) by mouth daily. 30 tablet 0  . hydrALAZINE (APRESOLINE) 25 MG tablet Take 1 tablet (25 mg total) by mouth in the morning and at bedtime. 180 tablet 3  . tolterodine (DETROL LA) 4 MG 24 hr capsule Take 4 mg by mouth daily.     No current facility-administered medications for this visit.    Allergies:   Codeine sulfate [codeine] and Sulfa antibiotics    ROS:  Please see the history of present illness.   Otherwise, review of systems are positive for none.   All other systems are reviewed and negative.    PHYSICAL EXAM: VS:  BP 118/62 (BP Location: Right Arm, Patient Position: Sitting)   Pulse 85   Ht 5\' 3"  (1.6 m)   Wt 182 lb 12.8 oz (82.9  kg)   SpO2 93%   BMI 32.38 kg/m  , BMI Body mass index is 32.38 kg/m. GENERAL:  Well appearing NECK:  No jugular venous distention, waveform within normal limits, carotid upstroke brisk and symmetric, no bruits, no thyromegaly LUNGS:  Clear to auscultation bilaterally CHEST:  Unremarkable HEART:  PMI not displaced or sustained,S1 and S2 within normal limits, no S3, no S4, no clicks, no rubs, no murmurs ABD:  Flat, positive bowel sounds normal in frequency in pitch, no bruits, no rebound, no guarding, no midline pulsatile mass, no hepatomegaly, no splenomegaly EXT:  2 plus pulses throughout, mild to moderate right greater than left leg edema, no cyanosis no clubbing   EKG:  EKG is not ordered today.   Recent Labs: 10/22/2020: TSH 2.560 10/26/2020: ALT 12; B Natriuretic Peptide 62.4; Magnesium 2.1 11/22/2020: BUN 9; Creatinine 0.5; Hemoglobin 12.3; Platelets 231; Potassium 4.1; Sodium 139    Lipid Panel No results found for: CHOL, TRIG, HDL, CHOLHDL, VLDL, LDLCALC, LDLDIRECT    Wt Readings from Last 3 Encounters:  03/13/21 182 lb 12.8 oz (82.9 kg)  01/30/21 182 lb (82.6 kg)  12/20/20 175 lb (79.4 kg)      Other studies Reviewed: Additional studies/ records that were reviewed today include: None Review of the above records demonstrates:  Please see elsewhere in the note.     ASSESSMENT AND PLAN:  CARDIOMYOPATHY:     I would not suspect her ejection fraction is reduced compared to previous.  She has had a negative work-up for her LVH in terms of no suggestion of amyloidosis.  She seems to be euvolemic except for lower extremity swelling.  She did not want to take diuretics to treat this alignment management conservatively.  We talked about salt restriction.   LVH:   This has been mild.  No change in therapy.  HTN:   Her blood pressure seems to be running low.  I am asked to get an echo for hydralazine.  No change in therapy.  SLEEP APNEA;   she has not wanted to use CPAP in  the past.  She says she sleeps well actually. is.   Current medicines are reviewed at length with the patient today.  The patient does not have concerns regarding medicines.  The following changes have been made: As above  Labs/ tests ordered today include: None  No orders of the defined types were placed in this encounter.    Disposition:   FU with me in 12 months.     Signed, 12/22/20, MD  03/13/2021 3:16 PM  [facility]

## 2021-03-13 ENCOUNTER — Encounter: Payer: Self-pay | Admitting: Cardiology

## 2021-03-13 ENCOUNTER — Ambulatory Visit: Payer: PPO | Admitting: Cardiology

## 2021-03-13 ENCOUNTER — Other Ambulatory Visit: Payer: Self-pay

## 2021-03-13 VITALS — BP 118/62 | HR 85 | Ht 63.0 in | Wt 182.8 lb

## 2021-03-13 DIAGNOSIS — I420 Dilated cardiomyopathy: Secondary | ICD-10-CM

## 2021-03-13 DIAGNOSIS — I10 Essential (primary) hypertension: Secondary | ICD-10-CM | POA: Diagnosis not present

## 2021-03-13 DIAGNOSIS — I517 Cardiomegaly: Secondary | ICD-10-CM

## 2021-03-13 MED ORDER — HYDRALAZINE HCL 25 MG PO TABS: 25.0000 mg | ORAL_TABLET | Freq: Two times a day (BID) | ORAL | 3 refills | Status: AC

## 2021-03-13 NOTE — Patient Instructions (Addendum)
Medication Instructions:  DECREASE: Hydralazine 25 mg by mouth twice a day  *If you need a refill on your cardiac medications before your next appointment, please call your pharmacy*   Lab Work: None Ordered   Testing/Procedures: None ordered   Follow-Up: At [facility], you and your health needs are our priority.  As part of our continuing mission to provide you with exceptional heart care, we have created designated Provider Care Teams.  These Care Teams include your primary Cardiologist (physician) and Advanced Practice Providers (APPs -  Physician Assistants and Nurse Practitioners) who all work together to provide you with the care you need, when you need it.  We recommend signing up for the patient portal called "MyChart".  Sign up information is provided on this After Visit Summary.  MyChart is used to connect with patients for Virtual Visits (Telemedicine).  Patients are able to view lab/test results, encounter notes, upcoming appointments, etc.  Non-urgent messages can be sent to your provider as well.   To learn more about what you can do with MyChart, go to [email or web address].    Your next appointment:   1 year(s)  The format for your next appointment:   In Person  Provider:   You may see Rollene Rotunda, MD or one of the following Advanced Practice Providers on your designated Care Team:    Theodore Demark, PA-C  Joni Reining, DNP, ANP

## 2021-03-15 DIAGNOSIS — N3942 Incontinence without sensory awareness: Secondary | ICD-10-CM | POA: Diagnosis not present

## 2021-03-15 DIAGNOSIS — N3946 Mixed incontinence: Secondary | ICD-10-CM | POA: Diagnosis not present

## 2021-03-19 DIAGNOSIS — R296 Repeated falls: Secondary | ICD-10-CM | POA: Diagnosis not present

## 2021-03-19 DIAGNOSIS — M6281 Muscle weakness (generalized): Secondary | ICD-10-CM | POA: Diagnosis not present

## 2021-03-19 DIAGNOSIS — R262 Difficulty in walking, not elsewhere classified: Secondary | ICD-10-CM | POA: Diagnosis not present

## 2021-03-19 DIAGNOSIS — R488 Other symbolic dysfunctions: Secondary | ICD-10-CM | POA: Diagnosis not present

## 2021-03-19 DIAGNOSIS — M25512 Pain in left shoulder: Secondary | ICD-10-CM | POA: Diagnosis not present

## 2021-03-19 DIAGNOSIS — M25511 Pain in right shoulder: Secondary | ICD-10-CM | POA: Diagnosis not present

## 2021-03-20 DIAGNOSIS — M25511 Pain in right shoulder: Secondary | ICD-10-CM | POA: Diagnosis not present

## 2021-03-20 DIAGNOSIS — M6281 Muscle weakness (generalized): Secondary | ICD-10-CM | POA: Diagnosis not present

## 2021-03-20 DIAGNOSIS — M25512 Pain in left shoulder: Secondary | ICD-10-CM | POA: Diagnosis not present

## 2021-03-20 DIAGNOSIS — R262 Difficulty in walking, not elsewhere classified: Secondary | ICD-10-CM | POA: Diagnosis not present

## 2021-03-20 DIAGNOSIS — R488 Other symbolic dysfunctions: Secondary | ICD-10-CM | POA: Diagnosis not present

## 2021-03-20 DIAGNOSIS — R296 Repeated falls: Secondary | ICD-10-CM | POA: Diagnosis not present

## 2021-03-21 DIAGNOSIS — R488 Other symbolic dysfunctions: Secondary | ICD-10-CM | POA: Diagnosis not present

## 2021-03-21 DIAGNOSIS — R262 Difficulty in walking, not elsewhere classified: Secondary | ICD-10-CM | POA: Diagnosis not present

## 2021-03-21 DIAGNOSIS — M25512 Pain in left shoulder: Secondary | ICD-10-CM | POA: Diagnosis not present

## 2021-03-21 DIAGNOSIS — M25511 Pain in right shoulder: Secondary | ICD-10-CM | POA: Diagnosis not present

## 2021-03-21 DIAGNOSIS — H353211 Exudative age-related macular degeneration, right eye, with active choroidal neovascularization: Secondary | ICD-10-CM | POA: Diagnosis not present

## 2021-03-21 DIAGNOSIS — R296 Repeated falls: Secondary | ICD-10-CM | POA: Diagnosis not present

## 2021-03-21 DIAGNOSIS — M6281 Muscle weakness (generalized): Secondary | ICD-10-CM | POA: Diagnosis not present

## 2021-03-22 DIAGNOSIS — M6281 Muscle weakness (generalized): Secondary | ICD-10-CM | POA: Diagnosis not present

## 2021-03-22 DIAGNOSIS — R262 Difficulty in walking, not elsewhere classified: Secondary | ICD-10-CM | POA: Diagnosis not present

## 2021-03-22 DIAGNOSIS — R488 Other symbolic dysfunctions: Secondary | ICD-10-CM | POA: Diagnosis not present

## 2021-03-22 DIAGNOSIS — M25511 Pain in right shoulder: Secondary | ICD-10-CM | POA: Diagnosis not present

## 2021-03-22 DIAGNOSIS — M25512 Pain in left shoulder: Secondary | ICD-10-CM | POA: Diagnosis not present

## 2021-03-22 DIAGNOSIS — R296 Repeated falls: Secondary | ICD-10-CM | POA: Diagnosis not present

## 2021-03-26 DIAGNOSIS — R262 Difficulty in walking, not elsewhere classified: Secondary | ICD-10-CM | POA: Diagnosis not present

## 2021-03-26 DIAGNOSIS — R488 Other symbolic dysfunctions: Secondary | ICD-10-CM | POA: Diagnosis not present

## 2021-03-26 DIAGNOSIS — M25512 Pain in left shoulder: Secondary | ICD-10-CM | POA: Diagnosis not present

## 2021-03-26 DIAGNOSIS — R296 Repeated falls: Secondary | ICD-10-CM | POA: Diagnosis not present

## 2021-03-26 DIAGNOSIS — M25511 Pain in right shoulder: Secondary | ICD-10-CM | POA: Diagnosis not present

## 2021-03-26 DIAGNOSIS — M6281 Muscle weakness (generalized): Secondary | ICD-10-CM | POA: Diagnosis not present

## 2021-03-27 DIAGNOSIS — R296 Repeated falls: Secondary | ICD-10-CM | POA: Diagnosis not present

## 2021-03-27 DIAGNOSIS — R488 Other symbolic dysfunctions: Secondary | ICD-10-CM | POA: Diagnosis not present

## 2021-03-27 DIAGNOSIS — R262 Difficulty in walking, not elsewhere classified: Secondary | ICD-10-CM | POA: Diagnosis not present

## 2021-03-27 DIAGNOSIS — M6281 Muscle weakness (generalized): Secondary | ICD-10-CM | POA: Diagnosis not present

## 2021-03-27 DIAGNOSIS — M25511 Pain in right shoulder: Secondary | ICD-10-CM | POA: Diagnosis not present

## 2021-03-27 DIAGNOSIS — M25512 Pain in left shoulder: Secondary | ICD-10-CM | POA: Diagnosis not present

## 2021-03-28 DIAGNOSIS — M6281 Muscle weakness (generalized): Secondary | ICD-10-CM | POA: Diagnosis not present

## 2021-03-28 DIAGNOSIS — R488 Other symbolic dysfunctions: Secondary | ICD-10-CM | POA: Diagnosis not present

## 2021-03-28 DIAGNOSIS — R262 Difficulty in walking, not elsewhere classified: Secondary | ICD-10-CM | POA: Diagnosis not present

## 2021-03-28 DIAGNOSIS — M25512 Pain in left shoulder: Secondary | ICD-10-CM | POA: Diagnosis not present

## 2021-03-28 DIAGNOSIS — R296 Repeated falls: Secondary | ICD-10-CM | POA: Diagnosis not present

## 2021-03-28 DIAGNOSIS — M25511 Pain in right shoulder: Secondary | ICD-10-CM | POA: Diagnosis not present

## 2021-03-29 DIAGNOSIS — R296 Repeated falls: Secondary | ICD-10-CM | POA: Diagnosis not present

## 2021-03-29 DIAGNOSIS — R488 Other symbolic dysfunctions: Secondary | ICD-10-CM | POA: Diagnosis not present

## 2021-03-29 DIAGNOSIS — M25511 Pain in right shoulder: Secondary | ICD-10-CM | POA: Diagnosis not present

## 2021-03-29 DIAGNOSIS — R262 Difficulty in walking, not elsewhere classified: Secondary | ICD-10-CM | POA: Diagnosis not present

## 2021-03-29 DIAGNOSIS — M6281 Muscle weakness (generalized): Secondary | ICD-10-CM | POA: Diagnosis not present

## 2021-03-29 DIAGNOSIS — M25512 Pain in left shoulder: Secondary | ICD-10-CM | POA: Diagnosis not present

## 2021-03-30 DIAGNOSIS — R296 Repeated falls: Secondary | ICD-10-CM | POA: Diagnosis not present

## 2021-03-30 DIAGNOSIS — M25511 Pain in right shoulder: Secondary | ICD-10-CM | POA: Diagnosis not present

## 2021-03-30 DIAGNOSIS — R262 Difficulty in walking, not elsewhere classified: Secondary | ICD-10-CM | POA: Diagnosis not present

## 2021-03-30 DIAGNOSIS — M6281 Muscle weakness (generalized): Secondary | ICD-10-CM | POA: Diagnosis not present

## 2021-03-30 DIAGNOSIS — M25512 Pain in left shoulder: Secondary | ICD-10-CM | POA: Diagnosis not present

## 2021-03-30 DIAGNOSIS — R488 Other symbolic dysfunctions: Secondary | ICD-10-CM | POA: Diagnosis not present

## 2021-04-02 DIAGNOSIS — M25512 Pain in left shoulder: Secondary | ICD-10-CM | POA: Diagnosis not present

## 2021-04-02 DIAGNOSIS — M25511 Pain in right shoulder: Secondary | ICD-10-CM | POA: Diagnosis not present

## 2021-04-02 DIAGNOSIS — M6281 Muscle weakness (generalized): Secondary | ICD-10-CM | POA: Diagnosis not present

## 2021-04-02 DIAGNOSIS — R262 Difficulty in walking, not elsewhere classified: Secondary | ICD-10-CM | POA: Diagnosis not present

## 2021-04-02 DIAGNOSIS — R488 Other symbolic dysfunctions: Secondary | ICD-10-CM | POA: Diagnosis not present

## 2021-04-02 DIAGNOSIS — R296 Repeated falls: Secondary | ICD-10-CM | POA: Diagnosis not present

## 2021-04-03 DIAGNOSIS — R262 Difficulty in walking, not elsewhere classified: Secondary | ICD-10-CM | POA: Diagnosis not present

## 2021-04-03 DIAGNOSIS — R488 Other symbolic dysfunctions: Secondary | ICD-10-CM | POA: Diagnosis not present

## 2021-04-03 DIAGNOSIS — M6281 Muscle weakness (generalized): Secondary | ICD-10-CM | POA: Diagnosis not present

## 2021-04-03 DIAGNOSIS — M25512 Pain in left shoulder: Secondary | ICD-10-CM | POA: Diagnosis not present

## 2021-04-03 DIAGNOSIS — M25511 Pain in right shoulder: Secondary | ICD-10-CM | POA: Diagnosis not present

## 2021-04-03 DIAGNOSIS — R296 Repeated falls: Secondary | ICD-10-CM | POA: Diagnosis not present

## 2021-04-04 DIAGNOSIS — I5022 Chronic systolic (congestive) heart failure: Secondary | ICD-10-CM | POA: Diagnosis not present

## 2021-04-04 DIAGNOSIS — E7800 Pure hypercholesterolemia, unspecified: Secondary | ICD-10-CM | POA: Diagnosis not present

## 2021-04-04 DIAGNOSIS — R262 Difficulty in walking, not elsewhere classified: Secondary | ICD-10-CM | POA: Diagnosis not present

## 2021-04-04 DIAGNOSIS — R488 Other symbolic dysfunctions: Secondary | ICD-10-CM | POA: Diagnosis not present

## 2021-04-04 DIAGNOSIS — F0390 Unspecified dementia without behavioral disturbance: Secondary | ICD-10-CM | POA: Diagnosis not present

## 2021-04-04 DIAGNOSIS — K219 Gastro-esophageal reflux disease without esophagitis: Secondary | ICD-10-CM | POA: Diagnosis not present

## 2021-04-04 DIAGNOSIS — F321 Major depressive disorder, single episode, moderate: Secondary | ICD-10-CM | POA: Diagnosis not present

## 2021-04-04 DIAGNOSIS — R600 Localized edema: Secondary | ICD-10-CM | POA: Diagnosis not present

## 2021-04-04 DIAGNOSIS — M25512 Pain in left shoulder: Secondary | ICD-10-CM | POA: Diagnosis not present

## 2021-04-04 DIAGNOSIS — I5181 Takotsubo syndrome: Secondary | ICD-10-CM | POA: Diagnosis not present

## 2021-04-04 DIAGNOSIS — M6281 Muscle weakness (generalized): Secondary | ICD-10-CM | POA: Diagnosis not present

## 2021-04-04 DIAGNOSIS — R296 Repeated falls: Secondary | ICD-10-CM | POA: Diagnosis not present

## 2021-04-04 DIAGNOSIS — M25511 Pain in right shoulder: Secondary | ICD-10-CM | POA: Diagnosis not present

## 2021-04-05 DIAGNOSIS — M25512 Pain in left shoulder: Secondary | ICD-10-CM | POA: Diagnosis not present

## 2021-04-05 DIAGNOSIS — M6281 Muscle weakness (generalized): Secondary | ICD-10-CM | POA: Diagnosis not present

## 2021-04-05 DIAGNOSIS — R296 Repeated falls: Secondary | ICD-10-CM | POA: Diagnosis not present

## 2021-04-05 DIAGNOSIS — R262 Difficulty in walking, not elsewhere classified: Secondary | ICD-10-CM | POA: Diagnosis not present

## 2021-04-05 DIAGNOSIS — R488 Other symbolic dysfunctions: Secondary | ICD-10-CM | POA: Diagnosis not present

## 2021-04-05 DIAGNOSIS — M25511 Pain in right shoulder: Secondary | ICD-10-CM | POA: Diagnosis not present

## 2021-04-07 DIAGNOSIS — R262 Difficulty in walking, not elsewhere classified: Secondary | ICD-10-CM | POA: Diagnosis not present

## 2021-04-07 DIAGNOSIS — M25512 Pain in left shoulder: Secondary | ICD-10-CM | POA: Diagnosis not present

## 2021-04-07 DIAGNOSIS — R488 Other symbolic dysfunctions: Secondary | ICD-10-CM | POA: Diagnosis not present

## 2021-04-07 DIAGNOSIS — M25511 Pain in right shoulder: Secondary | ICD-10-CM | POA: Diagnosis not present

## 2021-04-07 DIAGNOSIS — M6281 Muscle weakness (generalized): Secondary | ICD-10-CM | POA: Diagnosis not present

## 2021-04-07 DIAGNOSIS — R296 Repeated falls: Secondary | ICD-10-CM | POA: Diagnosis not present

## 2021-04-08 DIAGNOSIS — R296 Repeated falls: Secondary | ICD-10-CM | POA: Diagnosis not present

## 2021-04-08 DIAGNOSIS — R488 Other symbolic dysfunctions: Secondary | ICD-10-CM | POA: Diagnosis not present

## 2021-04-08 DIAGNOSIS — M25511 Pain in right shoulder: Secondary | ICD-10-CM | POA: Diagnosis not present

## 2021-04-08 DIAGNOSIS — M25512 Pain in left shoulder: Secondary | ICD-10-CM | POA: Diagnosis not present

## 2021-04-08 DIAGNOSIS — M6281 Muscle weakness (generalized): Secondary | ICD-10-CM | POA: Diagnosis not present

## 2021-04-08 DIAGNOSIS — R262 Difficulty in walking, not elsewhere classified: Secondary | ICD-10-CM | POA: Diagnosis not present

## 2021-04-09 DIAGNOSIS — R488 Other symbolic dysfunctions: Secondary | ICD-10-CM | POA: Diagnosis not present

## 2021-04-09 DIAGNOSIS — R262 Difficulty in walking, not elsewhere classified: Secondary | ICD-10-CM | POA: Diagnosis not present

## 2021-04-09 DIAGNOSIS — M25511 Pain in right shoulder: Secondary | ICD-10-CM | POA: Diagnosis not present

## 2021-04-09 DIAGNOSIS — R296 Repeated falls: Secondary | ICD-10-CM | POA: Diagnosis not present

## 2021-04-09 DIAGNOSIS — M25512 Pain in left shoulder: Secondary | ICD-10-CM | POA: Diagnosis not present

## 2021-04-09 DIAGNOSIS — M6281 Muscle weakness (generalized): Secondary | ICD-10-CM | POA: Diagnosis not present

## 2021-04-10 ENCOUNTER — Other Ambulatory Visit: Payer: Self-pay

## 2021-04-10 ENCOUNTER — Encounter: Payer: Self-pay | Admitting: Psychology

## 2021-04-10 ENCOUNTER — Ambulatory Visit: Payer: PPO | Admitting: Psychology

## 2021-04-10 ENCOUNTER — Ambulatory Visit (INDEPENDENT_AMBULATORY_CARE_PROVIDER_SITE_OTHER): Payer: PPO | Admitting: Psychology

## 2021-04-10 DIAGNOSIS — I639 Cerebral infarction, unspecified: Secondary | ICD-10-CM | POA: Diagnosis not present

## 2021-04-10 DIAGNOSIS — R32 Unspecified urinary incontinence: Secondary | ICD-10-CM | POA: Insufficient documentation

## 2021-04-10 DIAGNOSIS — F0150 Vascular dementia without behavioral disturbance: Secondary | ICD-10-CM

## 2021-04-10 DIAGNOSIS — R4189 Other symptoms and signs involving cognitive functions and awareness: Secondary | ICD-10-CM

## 2021-04-10 DIAGNOSIS — K219 Gastro-esophageal reflux disease without esophagitis: Secondary | ICD-10-CM | POA: Insufficient documentation

## 2021-04-10 DIAGNOSIS — F329 Major depressive disorder, single episode, unspecified: Secondary | ICD-10-CM | POA: Insufficient documentation

## 2021-04-10 DIAGNOSIS — G4733 Obstructive sleep apnea (adult) (pediatric): Secondary | ICD-10-CM | POA: Diagnosis not present

## 2021-04-10 DIAGNOSIS — E7800 Pure hypercholesterolemia, unspecified: Secondary | ICD-10-CM | POA: Insufficient documentation

## 2021-04-10 DIAGNOSIS — E2839 Other primary ovarian failure: Secondary | ICD-10-CM | POA: Insufficient documentation

## 2021-04-10 HISTORY — DX: Vascular dementia, unspecified severity, without behavioral disturbance, psychotic disturbance, mood disturbance, and anxiety: F01.50

## 2021-04-10 NOTE — Progress Notes (Signed)
   Psychometrician Note   Cognitive testing was administered to [facility] by Wallace Keller, B.S. (psychometrist) under the supervision of Dr. Newman Nickels, Ph.D., licensed psychologist on 04/10/21. Kayla Carrillo did not appear overtly distressed by the testing session per behavioral observation or responses across self-report questionnaires. Rest breaks were offered.    The battery of tests administered was selected by Dr. Newman Nickels, Ph.D. with consideration to Kayla Carrillo's current level of functioning, the nature of her symptoms, emotional and behavioral responses during interview, level of literacy, observed level of motivation/effort, and the nature of the referral question. This battery was communicated to the psychometrist. Communication between Dr. Newman Nickels, Ph.D. and the psychometrist was ongoing throughout the evaluation and Dr. Newman Nickels, Ph.D. was immediately accessible at all times. Dr. Newman Nickels, Ph.D. provided supervision to the psychometrist on the date of this service to the extent necessary to assure the quality of all services provided.    Kayla Carrillo will return within approximately 1-2 weeks for an interactive feedback session with Dr. Milbert Coulter at which time her test performances, clinical impressions, and treatment recommendations will be reviewed in detail. Kayla Carrillo understands she can contact our office should she require our assistance before this time.  A total of 145 minutes of billable time were spent face-to-face with Kayla Carrillo by the psychometrist. This includes both test administration and scoring time. Billing for these services is reflected in the clinical report generated by Dr. Newman Nickels, Ph.D.  This note reflects time spent with the psychometrician and does not include test scores or any clinical interpretations made by Dr. Milbert Coulter. The full report will follow in a separate note.

## 2021-04-10 NOTE — Progress Notes (Signed)
NEUROPSYCHOLOGICAL EVALUATION [place]. [facility] Department of Neurology  Date of Evaluation: April 10, 2021  Reason for Referral:   Kayla Carrillo is a 85 y.o. right-handed Caucasian female referred by Kayla Carrillo, M.D., to characterize her current cognitive functioning and assist with diagnostic clarity and treatment planning in the context of subjective cognitive decline.   Assessment and Plan:   Clinical Impression(s): Kayla Carrillo pattern of performance is suggestive of prominent impairment surrounding processing speed, executive functioning, and learning and memory. Outside of her copy of a complex figure, visuospatial abilities were additionally impaired, while variability was exhibited across attention/concentration. Performances across verbal reasoning, safety/judgment, and both receptive and expressive language were appropriate. Kayla Carrillo currently resides in an independent living facility and her ex-daughter-in-law Melody reported [name] having difficulties completing instrumental activities of daily living (ADLs) without significant assistance. This, coupled with evidence for significant cognitive dysfunction described above, suggests that she meets criteria for a Major Neurocognitive Disorder ("dementia") at the present time.  The etiology of cognitive decline is somewhat unclear. There is undoubtedly a significant vascular component given that Kayla Carrillo's prior neuroimaging suggested small vessel infarctions in numerous brain regions, as well as extensive ischemic changes. Her pattern across testing is additionally consistent with a primary vascular neurocognitive disorder and it is possible that vascular dementia best characterizes her presentation. However, pure vascular dementia presentations are somewhat uncommon and I cannot rule out the added contribution of a neurodegenerative condition (i.e., "mixed dementia" presentation) such  as Alzheimer's disease or Lewy body dementia. However, regarding the former, despite deficits in information storage across memory testing, she performed well on expressive language tasks which typically exhibit deficits in this presentation. Additionally, regarding the latter, she did not exhibit common behavioral characteristics of this condition, including fully-formed visual hallucinations, REM sleep behaviors, or parkinsonisms. As such, I cannot firmly rule in or out either of these conditions in addition to her vascular contributions. Continued medical monitoring will be important moving forward.   Recommendations: Melody noted that Kayla Carrillo has seemed more clear since starting memantine/Namenda. She is encouraged to continue taking this medication as prescribed. It is important to highlight that this medication has been show to slow functional decline in some individuals. Unfortunately, there is no current treatment which can stop or reverse cognitive decline.   Should Dr. Karel Jarvis or her medical providers feel that a repeat evaluation at some point in the future would be useful, then they would be recommended to make a new referral at that time. Ideally testing would not take place sooner than 12 months from the current date unless significant functional decline is noted.  While performance across neurocognitive testing is not a strong predictor of an individual's safety operating a motor vehicle, I do agree with recommendations that she abstain from all driving pursuits. Should her family wish to pursue a formalized driving evaluation, they would be encouraged to contact [facility] in [place], [place] at [phone]. Another option would be through [facility]; however, the latter would likely require a referral from a medical doctor. [facility] can be reached directly at [phone].   Should there be further progression of current deficits over time, Ms.  Carrillo is unlikely to regain any independent living skills lost. She will likely benefit from the establishment and maintenance of a routine in order to maximize her functional abilities over time.  It will be important for Kayla Carrillo to have another person with her when in  situations where she may need to process information, weigh the pros and cons of different options, and make decisions, in order to ensure that she fully understands and recalls all information to be considered.  If not already done, Kayla Carrillo and her family may want to discuss her wishes regarding durable power of attorney and medical decision making, so that she can have input into these choices. Her current placement within an independent living facility seems quite appropriate and I would not recommend any significant changes unless her treatment team/caregivers feel it necessary.   Kayla Carrillo is encouraged to attend to lifestyle factors for brain health (e.g., regular physical exercise, good nutrition habits, regular participation in cognitively-stimulating activities, and general stress management techniques), which are likely to have benefits for both emotional adjustment and cognition. Optimal control of vascular risk factors (including safe cardiovascular exercise and adherence to dietary recommendations) is encouraged. Continued participation in activities which provide mental stimulation and social interaction is also recommended.   Untreated obstructive sleep apnea will likely worsen vascular-related brain changes, as well as ongoing cognitive dysfunction. It will also increase her risk for additional stroke, heart attack, and further cognitive decline. As such, she is strongly encouraged to resume using her CPAP machine or seek an updated sleep study to assess if this condition is still present.   Important information to remember should be provided in written formats in all instances. This should be placed  in a highly visible and commonly frequented area of her residence to help promote recall.   To address problems with processing speed, she may wish to consider:   -Ensuring that she is alerted when essential material or instructions are being presented   -Adjusting the speed at which new information is presented   -Allowing for more time in comprehending, processing, and responding in conversation  To address problems with fluctuating attention and executive dysfunction, she may wish to consider:   -Avoiding external distractions when needing to concentrate   -Limiting exposure to fast paced environments with multiple sensory demands   -Writing down complicated information and using checklists   -Attempting and completing one task at a time (i.e., no multi-tasking)   -Verbalizing aloud each step of a task to maintain focus   -Reducing the amount of information considered at one time  Review of Records:   Ms. Maines was seen by [facility] Neurology Marland KitchenPatrcia Carrillo, M.D.) on 01/30/2021 for an evaluation of memory loss. Briefly, when asked about her memory, she reported being unable to recall names of familiar individuals. Word finding difficulties were also noted. Her ex-daughter-in-law Melody first started noticing changes in 2019 when Ms. Menefee was living in [place]. There was a lot of stress at that time as she was in the middle of a divorce at age 73 and had to sell her house. She moved in with her ex-son-in-law in [place], but further stress was reported when her daughter also came to stay. She moved to [place] around August 2021. However, even when stress had notably diminished, Melody reported persisting cognitive difficulties. For example, Ms. Vanorman has gotten lost driving on several occasions and at one point was driving on the wrong side of the road (September 2021). She will forget to take medications leading to Melody to organize her pillbox for her. Most  bills are on auto-pay. Paranoia or hallucinations were denied. She acknowledged mild depression, stating that Zoloft has seemed helpful.  She reported some urinary incontinence and utilizes adult diapers. There is also  report of some orthostatic hypotension and a history of several falls in the past. Her most recent fall was in October 2021, where she was admitted to the ED for left elbow and right humerus fractures s/p ORIF. At that time, she had a head CT without contrast which revealed mild diffuse atrophy and moderate chronic microvascular disease. She has occasional double vision and noted that she lost her sense of smell after taking cough medication years ago. No headaches, dysarthria/dysphagia, back pain, or tremors were reported. Performance on a brief cognitive screening instrument (MOCA) was 13/30. Ultimately, Ms. Brege was referred for a comprehensive neuropsychological evaluation to characterize her cognitive abilities and to assist with diagnostic clarity and treatment planning.   Brain MRI on 02/17/2021 revealed extensive small vessel ischemic changes throughout the pons. A few old small vessel cerebellar infarctions were noted, as well as small vessel infractions in the thalami and basal ganglia.   Past Medical History:  Diagnosis Date  . Abnormal EKG 02/16/2019  . Acute lower GI bleeding 04/06/2018  . Cataract, left eye 11/02/2013  . Chronic systolic heart failure   . Closed displaced transcondylar fracture of right humerus 10/21/2020   sustained in a mechanical fall; ORIF surgical repair 10/23/2020  . Closed olecranon fracture, left 10/21/2020   mechanical fall; ORIF 10/23/2020  . Cystoid macular edema of right eye 04/25/2020   OD with minor CME nasal to the foveal avascular zone this is a surrogate for active CNVM OD  . Diverticulosis of colon with hemorrhage 04/06/2018  . Essential hypertension 10/21/2020  . Exudative age-related macular degeneration of right eye with  active choroidal neovascularization 04/25/2020  . Gastroesophageal reflux disease   . Hyperlipidemia 10/10/2010  . Intermediate stage nonexudative age-related macular degeneration of left eye 04/25/2020  . Leukocytosis 04/06/2018   White blood count varied from 11,600 up to 12,600 without left shift.  No signs of infection present.  Leukocytosis is attributed to stress of the fall, fractures and surgery.  . Localized osteoarthritis of lower leg 04/16/2011   right knee  . LVH (left ventricular hypertrophy) 12/10/2020  . Major depressive disorder   . Obstructive sleep apnea 04/09/2011   Not using CPAP  . Osteoarthritis of shoulder region 11/12/2010  . Pure hypercholesterolemia   . Stroke    02/17/2021 brain MRI - A few old small vessel cerebellar infarctions were noted, as well as small vessel infarctions in the thalami and basal ganglia.   . Takotsubo cardiomyopathy 06/17/2013  . Urinary incontinence     Past Surgical History:  Procedure Laterality Date  . CHOLECYSTECTOMY    . LEG SURGERY     Metal rod left leg  . ORIF ELBOW FRACTURE Right 10/23/2020   Procedure: OPEN REDUCTION INTERNAL FIXATION (ORIF) ELBOW/OLECRANON FRACTURE;  Surgeon: Myrene Galas, MD;  Location: [facility] OR;  Service: Orthopedics;  Laterality: Right;  . ORIF HUMERUS FRACTURE Left 10/23/2020   Procedure: OPEN REDUCTION INTERNAL FIXATION LEFT DISTAL HUMERUS FRACTURE;  Surgeon: Myrene Galas, MD;  Location: [facility] OR;  Service: Orthopedics;  Laterality: Left;  . TOTAL KNEE ARTHROPLASTY     Bilateral    Current Outpatient Medications:  .  carvedilol (COREG) 6.25 MG tablet, Take 1 tablet (6.25 mg total) by mouth 2 (two) times daily with a meal., Disp: 60 tablet, Rfl: 0 .  cholecalciferol (VITAMIN D) 25 MCG tablet, Take 2 tablets (2,000 Units total) by mouth daily., Disp: , Rfl:  .  Ensure (ENSURE), Take 237 mLs by mouth 2 (two) times daily between  meals. To prevent malnutrition, Disp: , Rfl:  .  hydrALAZINE (APRESOLINE) 25  MG tablet, Take 1 tablet (25 mg total) by mouth in the morning and at bedtime., Disp: 180 tablet, Rfl: 3 .  memantine (NAMENDA) 10 MG tablet, TAKE 1 TABLET EVERY NIGHT FOR 2 WEEKS, THEN INCREASE TO 1 TABLET TWICE A DAY, Disp: 180 tablet, Rfl: 1 .  nitrofurantoin (MACRODANTIN) 100 MG capsule, Take 1 capsule (100 mg total) by mouth at bedtime., Disp: 30 capsule, Rfl: 0 .  omeprazole (PRILOSEC) 20 MG capsule, Take 1 capsule (20 mg total) by mouth daily., Disp: 30 capsule, Rfl: 0 .  oxybutynin (DITROPAN-XL) 10 MG 24 hr tablet, Take 1 tablet (10 mg total) by mouth daily., Disp: 30 tablet, Rfl: 0 .  sertraline (ZOLOFT) 50 MG tablet, Take 1.5 tablets (75 mg total) by mouth daily. Give 1.5 tablets to = 75 mg, Disp: 45 tablet, Rfl: 0 .  simvastatin (ZOCOR) 40 MG tablet, Take 1 tablet (40 mg total) by mouth daily., Disp: 30 tablet, Rfl: 0 .  tolterodine (DETROL LA) 4 MG 24 hr capsule, Take 4 mg by mouth daily., Disp: , Rfl:   Clinical Interview:   The following information was obtained during a clinical interview with Ms. Deemer and her ex-daughter-in-law Melody prior to cognitive testing.  Cognitive Symptoms: Decreased short-term memory: Endorsed. Primary difficulties included trouble recalling the names of familiar individuals. Trouble misplacing things was also noted; however, the extent of memory dysfunction was generally minimized ("they're not that bad"). Melody reported Ms. Kopplin having some trouble recalling the details of prior conversations and remembering to check her calendar which Melody organizes. Melody did note that Ms. Gaubert has seemed more clear-headed since starting memantine. Difficulties were said to be noticeable since 2019 as described above and have persisted since that time.  Decreased long-term memory: Denied. Decreased attention/concentration: Endorsed. When asked, she noted longstanding trouble with comprehension, dating back to early childhood, as well as  distractibility. Melody added that she often has to redirect Ms. Accardo to "stay on mission" when they are out running errands.  Reduced processing speed: Denied. Difficulties with executive functions: Endorsed. She receives notable assistance from Melody and her caregivers surrounding complex planning and organization. Mild trouble with impulsive shopping was noted at times but not to an excessive degree. No personality changes were reported.  Difficulties with emotion regulation: Denied. Difficulties with receptive language: Denied. Difficulties with word finding: Endorsed. Decreased visuoperceptual ability: Denied.  Difficulties completing ADLs: Endorsed. Ms. Treptow resides in an independent living facility [facility]). Melody organizes her medications and her caregivers will provide reminders for her to take these medicates at the appropriate time. Most bills were said to be on auto-draft. She does not currently drive, stating that she gave up her car. Melody added that this was due to several instances where Ms. Sullivant got lost while driving in the semi-recent past.   Additional Medical History: History of traumatic brain injury/concussion: Unclear. She did report a history of several falls, including at least 1-2 which resulted in a direct impact to her head. She was unaware of any losses in consciousness and did not report any persisting cognitive difficulties from these events.  History of stroke: Her recent brain MRI revealed a few old small vessel cerebellar infarctions, as well as small vessel infractions in the thalami and basal ganglia. This was seen in the midst of extensive ischemic changes, especially involving the pons.  History of seizure activity: Denied. History of known exposure  to toxins: Denied. Symptoms of chronic pain: Denied outside of symptoms of arthritis.  Experience of frequent headaches/migraines: Denied. Frequent instances of  dizziness/vertigo: Denied. However, she did report some symptoms of orthostatic hypotension, especially in the morning when first getting up.   Sensory changes: She has macular degeneration but reported being able to see reasonably well with specialized glasses. Medical records also suggest a history of cataracts in the left eye. She reported losing her sense of smell many years prior; this was attributed to side effects of an unspecified cough medication. Other sensory changes/difficulties (e.g., hearing or taste) were denied.  Balance/coordination difficulties: Endorsed. She reported occasional balance instability, especially when she closes her eyes. She currently utilizes physical therapy services with success. She acknowledged a history of several prior falls. These were generally explained falls (e.g., slipping on the wet ground) rather than unexplained events. Her most recent fall in October 2021 resulted in her breaking both elbows and led to a fairly lengthy rehab stint.  Other motor difficulties: Denied.  Sleep History: Estimated hours obtained each night: 7-8 hours.  Difficulties falling asleep: Denied. Difficulties staying asleep: Denied. Feels rested and refreshed upon awakening: "Pretty much."  History of snoring: Endorsed. History of waking up gasping for air: Endorsed. Witnessed breath cessation while asleep: Endorsed. She acknowledged a history of obstructive sleep apnea and has been prescribed a CPAP machine in the past. She does not currently utilize this device. She noted that upon waking up in the middle of the night to use the restroom, the machine would cause trouble falling back asleep, leading to her cessation.   History of vivid dreaming: Denied. Excessive movement while asleep: Denied. Instances of acting out her dreams: Denied.  Psychiatric/Behavioral Health History: Depression: When asked about her current mood, she noted that she will "get down sometimes." Medical  records suggest a history of generally mild major depressive disorder. Current mood-related medications were said to be helpful in managing symptoms. Acute symptoms were said to be driven by a variety of psychosocial stressors including her divorce, movement into an independent living facility, and ongoing "family drama." Current or remote suicidal ideation, intent, or plan was denied.  Anxiety: Endorsed. Symptoms were said to be generalized and longstanding in nature. She noted that these symptoms appear to be well managed at the present time.  Mania: Denied. Trauma History: Denied. Visual/auditory hallucinations: Denied outside of her recent hospitalization following elbow fractures where she was likely on significant pain-related medications.  Delusional thoughts: Denied.  Tobacco: Denied. Alcohol: She reported very rare alcohol consumption and denied a history of problematic alcohol abuse or dependence.  Recreational drugs: Denied. Caffeine: Two cups of coffee in the morning.   Family History: Problem Relation Age of Onset  . Bipolar disorder Daughter    This information was confirmed by Ms. Lohmeyer.  Academic/Vocational History: Highest level of educational attainment: 12 years. 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 She noted the chance to attend college but did not do so due to her parents passing away at a young age. No relative weaknesses were identified.  History of developmental delay: Denied. History of grade repetition: Denied. Enrollment in special education courses: Denied. History of LD/ADHD: Denied.  Employment: Retired. 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   Evaluation Results:   Behavioral Observations: Ms. Supak was accompanied by her ex-daughter-in-law, arrived to her appointment  on time, and was appropriately  dressed and groomed. She appeared alert and oriented. She ambulated with the assistance of a rolling walker but navigated the hallways with this device well and did not exhibit any frank balance instability. Gross motor functioning appeared intact upon informal observation and no abnormal movements (e.g., tremors) were noted. Her affect was generally relaxed and positive, but did range appropriately given the subject being discussed during the clinical interview or the task at hand during testing procedures. Spontaneous speech was fluent. Mild word finding difficulties were observed during the clinical interview. Thought processes were coherent, organized, and normal in content. Insight into her cognitive difficulties appeared limited in that extensive and significant cognitive dysfunction was noted across testing despite Ms. Malphrus largely denying significant concerns during interview.   During testing, Ms. Judi CongKopczewski was very conversational and somewhat tangential. However, she was able to be redirected by the psychometrist as needed. Sustained attention was appropriate. Task engagement was adequate and she persisted when challenged. There were several tasks where she exhibited trouble comprehending instructions, requiring repetition. Overall, Ms. Judi CongKopczewski was cooperative with the clinical interview and subsequent testing procedures.   Adequacy of Effort: The validity of neuropsychological testing is limited by the extent to which the individual being tested may be assumed to have exerted adequate effort during testing. Ms. Judi CongKopczewski expressed her intention to perform to the best of her abilities and exhibited adequate task engagement and persistence. Scores across stand-alone and embedded performance validity measures were variable with two embedded tasks scoring below expectation. However, it remains possible that below expectation scores are related to true cognitive dysfunction rather than  poor/varying engagement. As such, while results may be interpreted with a mild degree of caution, I do believe that the current evaluation is a largely accurate representation of Ms. Loman ChromanKopczewski's current cognitive functioning.  Test Results: Ms. Judi CongKopczewski was poorly oriented at the time of the current evaluation. She incorrectly stated her current age ("85"), could not recall her street address, and was unable to state her phone number. She also stated the current year as "1922" and was unaware of her current location when asked.   Intellectual abilities based upon educational and vocational attainment were estimated to be in the average range. Premorbid abilities were estimated to be within the upper limits of the below average range based upon a single-word reading test.   Processing speed was exceptionally low to well below average. Basic attention was exceptionally low to below average. More complex attention (e.g., working memory) was well below average to below average. Executive functioning was variable. Cognitive flexibility and response inhibition were impaired. She did perform well on a verbal abstract reasoning task. She also performed well on a task assessing safety and judgment.  Assessed receptive language abilities were average. Assessed expressive language (e.g., verbal fluency and confrontation naming) was also largely average.     Assessed visuospatial/visuoconstructional abilities were largely impaired. Points were lost on her drawing of a clock due to mild spatial abnormalities with numerical placement, her including the number 6 twice, and both poor and incorrect hand placement with clock hands not originating from the center of the circle. Despite this, her copy of a complex figure was appropriate.    Learning (i.e., encoding) of novel verbal information was exceptionally low. Spontaneous delayed recall (i.e., retrieval) of previously learned information was exceptionally low to  well below average. Retention rates were 0% across a story learning task, 0% across a list learning task, and 21% across a figure  drawing task. Performance across recognition tasks was exceptionally low to well below average, suggesting limited evidence for information consolidation.   Results of emotional screening instruments suggested that recent symptoms of generalized anxiety were in the mild range, while symptoms of depression were also within the mild range. A screening instrument assessing recent sleep quality suggested the presence of minimal sleep dysfunction.  Tables of Scores:   Note: This summary of test scores accompanies the interpretive report and should not be considered in isolation without reference to the appropriate sections in the text. Descriptors are based on appropriate normative data and may be adjusted based on clinical judgment. The terms "impaired" and "within normal limits (WNL)" are used when a more specific level of functioning cannot be determined.       Validity Testing:   DESCRIPTOR       Dot Counting Test: --- --- Within Expectation  RBANS Effort Index: --- --- Below Expectation  WAIS-IV Reliable Digit Span: --- --- Below Expectation       Orientation:      Raw Score Percentile   NAB Orientation, Form 1 20/29 --- ---       Cognitive Screening:           Raw Score Percentile   SLUMS: 16/30 --- ---       RBANS, Form A: Standard Score/ Scaled Score Percentile   Total Score 54 <1 Exceptionally Low  Immediate Memory 49 <1 Exceptionally Low    List Learning 2 <1 Exceptionally Low    Story Memory 2 <1 Exceptionally Low  Visuospatial/Constructional 78 7 Well Below Average    Figure Copy 12 75 Above Average    Line Orientation 4/20 <2 Exceptionally Low  Language 89 23 Below Average    Picture Naming 10/10 >75 Above Average    Semantic Fluency 5 5 Well Below Average  Attention 46 <1 Exceptionally Low    Digit Span 3 1 Exceptionally Low    Coding 1 <1  Exceptionally Low  Delayed Memory 56 <1 Exceptionally Low    List Recall 0/10 <2 Exceptionally Low    List Recognition 16/20 3-9 Well Below Average    Story Recall 1 <1 Exceptionally Low    Story Recognition 7/12 7-13 Below Average    Figure Recall 5 5 Well Below Average    Figure Recognition 2/8 1-5 Well Below Average       Intellectual Functioning:           Standard Score Percentile   Test of Premorbid Functioning: 88 21 Below Average       Attention/Executive Function:          Trail Making Test (TMT): Raw Score (T Score) Percentile     Part A 89 secs.,  0 errors (27) 1 Exceptionally Low    Part B Discontinued --- Impaired         Scaled Score Percentile   WAIS-IV Digit Span: 4 2 Well Below Average    Forward 7 16 Below Average    Backward 6 9 Below Average    Sequencing 5 5 Well Below Average        Scaled Score Percentile   WAIS-IV Similarities: 9 37 Average       D-KEFS Color-Word Interference Test: Raw Score (Scaled Score) Percentile     Color Naming 65 secs. (1) <1 Exceptionally Low    Word Reading 39 secs. (4) 2 Well Below Average    Inhibition Discontinued --- Impaired    Inhibition/Switching Discontinued ---  Impaired       NAB Executive Functions Module, Form 1: T Score Percentile     Judgment 52 58 Average       Language:          Verbal Fluency Test: Raw Score  (Scaled Score) Percentile     Phonemic Fluency (CFL) 28 (9) 37 Average    Category Fluency 37 (10) 50 Average  *Based on [employer] Older Normative Studies (MOANS)          NAB Language Module, Form 1: T Score Percentile     Auditory Comprehension 48 42 Average    Naming 28/31 (48) 42 Average       Visuospatial/Visuoconstruction:      Raw Score Percentile   Clock Drawing: 5/10 --- Impaired        Scaled Score Percentile   WAIS-IV Block Design: 3 1 Exceptionally Low       Mood and Personality:      Raw Score Percentile   Geriatric Depression Scale: 12 --- Mild  Geriatric Anxiety Scale: 13  --- Mild    Somatic 4 --- Minimal    Cognitive 5 --- Mild    Affective 4 --- Mild       Additional Questionnaires:      Raw Score Percentile   PROMIS Sleep Disturbance Questionnaire: 8 --- None to Slight   Informed Consent and Coding/Compliance:   The current evaluation represents a clinical evaluation for the purposes previously outlined by the referral source and is in no way reflective of a forensic evaluation.   Ms. Ju was provided with a verbal description of the nature and purpose of the present neuropsychological evaluation. Also reviewed were the foreseeable risks and/or discomforts and benefits of the procedure, limits of confidentiality, and mandatory reporting requirements of this provider. The patient was given the opportunity to ask questions and receive answers about the evaluation. Oral consent to participate was provided by the patient.   This evaluation was conducted by Newman Nickels, Ph.D., licensed clinical neuropsychologist. Ms. Grieshop completed a clinical interview with Dr. Milbert Coulter, billed as one unit [phone], and 145 minutes of cognitive testing and scoring, billed as one unit [phone] and four additional units [id number]. Psychometrist Wallace Keller, B.S., assisted Dr. Milbert Coulter with test administration and scoring procedures. As a separate and discrete service, Dr. Milbert Coulter spent a total of 160 minutes in interpretation and report writing billed as one unit [phone] and two units [id number].

## 2021-04-11 ENCOUNTER — Encounter: Payer: Self-pay | Admitting: Psychology

## 2021-04-12 DIAGNOSIS — M25511 Pain in right shoulder: Secondary | ICD-10-CM | POA: Diagnosis not present

## 2021-04-12 DIAGNOSIS — R262 Difficulty in walking, not elsewhere classified: Secondary | ICD-10-CM | POA: Diagnosis not present

## 2021-04-12 DIAGNOSIS — M6281 Muscle weakness (generalized): Secondary | ICD-10-CM | POA: Diagnosis not present

## 2021-04-12 DIAGNOSIS — R488 Other symbolic dysfunctions: Secondary | ICD-10-CM | POA: Diagnosis not present

## 2021-04-12 DIAGNOSIS — R296 Repeated falls: Secondary | ICD-10-CM | POA: Diagnosis not present

## 2021-04-12 DIAGNOSIS — M25512 Pain in left shoulder: Secondary | ICD-10-CM | POA: Diagnosis not present

## 2021-04-13 DIAGNOSIS — R262 Difficulty in walking, not elsewhere classified: Secondary | ICD-10-CM | POA: Diagnosis not present

## 2021-04-13 DIAGNOSIS — M25512 Pain in left shoulder: Secondary | ICD-10-CM | POA: Diagnosis not present

## 2021-04-13 DIAGNOSIS — M25511 Pain in right shoulder: Secondary | ICD-10-CM | POA: Diagnosis not present

## 2021-04-13 DIAGNOSIS — R296 Repeated falls: Secondary | ICD-10-CM | POA: Diagnosis not present

## 2021-04-13 DIAGNOSIS — R488 Other symbolic dysfunctions: Secondary | ICD-10-CM | POA: Diagnosis not present

## 2021-04-13 DIAGNOSIS — M6281 Muscle weakness (generalized): Secondary | ICD-10-CM | POA: Diagnosis not present

## 2021-04-16 DIAGNOSIS — R262 Difficulty in walking, not elsewhere classified: Secondary | ICD-10-CM | POA: Diagnosis not present

## 2021-04-16 DIAGNOSIS — M25511 Pain in right shoulder: Secondary | ICD-10-CM | POA: Diagnosis not present

## 2021-04-16 DIAGNOSIS — M25512 Pain in left shoulder: Secondary | ICD-10-CM | POA: Diagnosis not present

## 2021-04-16 DIAGNOSIS — R296 Repeated falls: Secondary | ICD-10-CM | POA: Diagnosis not present

## 2021-04-16 DIAGNOSIS — M6281 Muscle weakness (generalized): Secondary | ICD-10-CM | POA: Diagnosis not present

## 2021-04-16 DIAGNOSIS — R488 Other symbolic dysfunctions: Secondary | ICD-10-CM | POA: Diagnosis not present

## 2021-04-17 ENCOUNTER — Ambulatory Visit (INDEPENDENT_AMBULATORY_CARE_PROVIDER_SITE_OTHER): Payer: PPO | Admitting: Psychology

## 2021-04-17 ENCOUNTER — Other Ambulatory Visit: Payer: Self-pay

## 2021-04-17 DIAGNOSIS — I639 Cerebral infarction, unspecified: Secondary | ICD-10-CM

## 2021-04-17 DIAGNOSIS — M25512 Pain in left shoulder: Secondary | ICD-10-CM | POA: Diagnosis not present

## 2021-04-17 DIAGNOSIS — F0150 Vascular dementia without behavioral disturbance: Secondary | ICD-10-CM | POA: Diagnosis not present

## 2021-04-17 DIAGNOSIS — F330 Major depressive disorder, recurrent, mild: Secondary | ICD-10-CM | POA: Diagnosis not present

## 2021-04-17 DIAGNOSIS — R262 Difficulty in walking, not elsewhere classified: Secondary | ICD-10-CM | POA: Diagnosis not present

## 2021-04-17 DIAGNOSIS — M25511 Pain in right shoulder: Secondary | ICD-10-CM | POA: Diagnosis not present

## 2021-04-17 DIAGNOSIS — R296 Repeated falls: Secondary | ICD-10-CM | POA: Diagnosis not present

## 2021-04-17 DIAGNOSIS — R488 Other symbolic dysfunctions: Secondary | ICD-10-CM | POA: Diagnosis not present

## 2021-04-17 DIAGNOSIS — M6281 Muscle weakness (generalized): Secondary | ICD-10-CM | POA: Diagnosis not present

## 2021-04-17 NOTE — Progress Notes (Signed)
   Neuropsychology Feedback Session Kayla Carrillo. [facility] Department of Neurology  Reason for Referral:   Kayla Carrillo a 85 y.o. right-handed Caucasian female referred by Patrcia Dolly, M.D.,to characterize hercurrent cognitive functioning and assist with diagnostic clarity and treatment planning in the context of subjective cognitive decline.   Feedback:   Kayla Carrillo completed a comprehensive neuropsychological evaluation on 04/10/2021. Please refer to that encounter for the full report and recommendations. Briefly, results suggested prominent impairment surrounding processing speed, executive functioning, and learning and memory. Outside of her copy of a complex figure, visuospatial abilities were additionally impaired, while variability was exhibited across attention/concentration. There is undoubtedly a significant vascular component given that Kayla Carrillo's prior neuroimaging suggested small vessel infarctions in numerous brain regions, as well as extensive ischemic changes. Her pattern across testing is additionally consistent with a primary vascular neurocognitive disorder and it is possible that vascular dementia best characterizes her presentation. However, pure vascular dementia presentations are somewhat uncommon and I cannot rule out the added contribution of a neurodegenerative condition (i.e., "mixed dementia" presentation) such as Alzheimer's disease or Lewy body dementia.  Kayla Carrillo was accompanied by her ex-daughter-in-law Kayla Carrillo during the current feedback session. Content of the current session focused on the results of her neuropsychological evaluation. Kayla Carrillo and Kayla Carrillo were given the opportunity to ask questions and their questions were answered. They were encouraged to reach out should additional questions arise. A copy of her report was provided at the conclusion of the visit.      25 minutes were spent conducting the current  feedback session with Kayla Carrillo, billed as one unit [phone].

## 2021-04-18 DIAGNOSIS — M6281 Muscle weakness (generalized): Secondary | ICD-10-CM | POA: Diagnosis not present

## 2021-04-18 DIAGNOSIS — M25511 Pain in right shoulder: Secondary | ICD-10-CM | POA: Diagnosis not present

## 2021-04-18 DIAGNOSIS — M25512 Pain in left shoulder: Secondary | ICD-10-CM | POA: Diagnosis not present

## 2021-04-18 DIAGNOSIS — R262 Difficulty in walking, not elsewhere classified: Secondary | ICD-10-CM | POA: Diagnosis not present

## 2021-04-18 DIAGNOSIS — R488 Other symbolic dysfunctions: Secondary | ICD-10-CM | POA: Diagnosis not present

## 2021-04-18 DIAGNOSIS — R296 Repeated falls: Secondary | ICD-10-CM | POA: Diagnosis not present

## 2021-04-19 DIAGNOSIS — R350 Frequency of micturition: Secondary | ICD-10-CM | POA: Diagnosis not present

## 2021-04-19 DIAGNOSIS — N3944 Nocturnal enuresis: Secondary | ICD-10-CM | POA: Diagnosis not present

## 2021-04-19 DIAGNOSIS — N3942 Incontinence without sensory awareness: Secondary | ICD-10-CM | POA: Diagnosis not present

## 2021-04-20 DIAGNOSIS — M25512 Pain in left shoulder: Secondary | ICD-10-CM | POA: Diagnosis not present

## 2021-04-20 DIAGNOSIS — R262 Difficulty in walking, not elsewhere classified: Secondary | ICD-10-CM | POA: Diagnosis not present

## 2021-04-20 DIAGNOSIS — M6281 Muscle weakness (generalized): Secondary | ICD-10-CM | POA: Diagnosis not present

## 2021-04-20 DIAGNOSIS — M25511 Pain in right shoulder: Secondary | ICD-10-CM | POA: Diagnosis not present

## 2021-04-20 DIAGNOSIS — R488 Other symbolic dysfunctions: Secondary | ICD-10-CM | POA: Diagnosis not present

## 2021-04-20 DIAGNOSIS — R296 Repeated falls: Secondary | ICD-10-CM | POA: Diagnosis not present

## 2021-04-21 DIAGNOSIS — R296 Repeated falls: Secondary | ICD-10-CM | POA: Diagnosis not present

## 2021-04-21 DIAGNOSIS — M25511 Pain in right shoulder: Secondary | ICD-10-CM | POA: Diagnosis not present

## 2021-04-21 DIAGNOSIS — R488 Other symbolic dysfunctions: Secondary | ICD-10-CM | POA: Diagnosis not present

## 2021-04-21 DIAGNOSIS — M25512 Pain in left shoulder: Secondary | ICD-10-CM | POA: Diagnosis not present

## 2021-04-21 DIAGNOSIS — M6281 Muscle weakness (generalized): Secondary | ICD-10-CM | POA: Diagnosis not present

## 2021-04-21 DIAGNOSIS — R262 Difficulty in walking, not elsewhere classified: Secondary | ICD-10-CM | POA: Diagnosis not present

## 2021-04-22 DIAGNOSIS — M25511 Pain in right shoulder: Secondary | ICD-10-CM | POA: Diagnosis not present

## 2021-04-22 DIAGNOSIS — R296 Repeated falls: Secondary | ICD-10-CM | POA: Diagnosis not present

## 2021-04-22 DIAGNOSIS — R488 Other symbolic dysfunctions: Secondary | ICD-10-CM | POA: Diagnosis not present

## 2021-04-22 DIAGNOSIS — M25512 Pain in left shoulder: Secondary | ICD-10-CM | POA: Diagnosis not present

## 2021-04-22 DIAGNOSIS — M6281 Muscle weakness (generalized): Secondary | ICD-10-CM | POA: Diagnosis not present

## 2021-04-22 DIAGNOSIS — R262 Difficulty in walking, not elsewhere classified: Secondary | ICD-10-CM | POA: Diagnosis not present

## 2021-04-24 DIAGNOSIS — R262 Difficulty in walking, not elsewhere classified: Secondary | ICD-10-CM | POA: Diagnosis not present

## 2021-04-24 DIAGNOSIS — M25511 Pain in right shoulder: Secondary | ICD-10-CM | POA: Diagnosis not present

## 2021-04-24 DIAGNOSIS — M6281 Muscle weakness (generalized): Secondary | ICD-10-CM | POA: Diagnosis not present

## 2021-04-24 DIAGNOSIS — M25512 Pain in left shoulder: Secondary | ICD-10-CM | POA: Diagnosis not present

## 2021-04-24 DIAGNOSIS — R488 Other symbolic dysfunctions: Secondary | ICD-10-CM | POA: Diagnosis not present

## 2021-04-24 DIAGNOSIS — R296 Repeated falls: Secondary | ICD-10-CM | POA: Diagnosis not present

## 2021-04-27 DIAGNOSIS — M6281 Muscle weakness (generalized): Secondary | ICD-10-CM | POA: Diagnosis not present

## 2021-04-27 DIAGNOSIS — M25511 Pain in right shoulder: Secondary | ICD-10-CM | POA: Diagnosis not present

## 2021-04-27 DIAGNOSIS — R488 Other symbolic dysfunctions: Secondary | ICD-10-CM | POA: Diagnosis not present

## 2021-04-27 DIAGNOSIS — R296 Repeated falls: Secondary | ICD-10-CM | POA: Diagnosis not present

## 2021-04-27 DIAGNOSIS — R262 Difficulty in walking, not elsewhere classified: Secondary | ICD-10-CM | POA: Diagnosis not present

## 2021-04-27 DIAGNOSIS — M25512 Pain in left shoulder: Secondary | ICD-10-CM | POA: Diagnosis not present

## 2021-04-30 DIAGNOSIS — R262 Difficulty in walking, not elsewhere classified: Secondary | ICD-10-CM | POA: Diagnosis not present

## 2021-04-30 DIAGNOSIS — R296 Repeated falls: Secondary | ICD-10-CM | POA: Diagnosis not present

## 2021-04-30 DIAGNOSIS — R488 Other symbolic dysfunctions: Secondary | ICD-10-CM | POA: Diagnosis not present

## 2021-04-30 DIAGNOSIS — M25512 Pain in left shoulder: Secondary | ICD-10-CM | POA: Diagnosis not present

## 2021-04-30 DIAGNOSIS — M25511 Pain in right shoulder: Secondary | ICD-10-CM | POA: Diagnosis not present

## 2021-04-30 DIAGNOSIS — M6281 Muscle weakness (generalized): Secondary | ICD-10-CM | POA: Diagnosis not present

## 2021-05-01 DIAGNOSIS — R296 Repeated falls: Secondary | ICD-10-CM | POA: Diagnosis not present

## 2021-05-01 DIAGNOSIS — R262 Difficulty in walking, not elsewhere classified: Secondary | ICD-10-CM | POA: Diagnosis not present

## 2021-05-01 DIAGNOSIS — M25511 Pain in right shoulder: Secondary | ICD-10-CM | POA: Diagnosis not present

## 2021-05-01 DIAGNOSIS — M25512 Pain in left shoulder: Secondary | ICD-10-CM | POA: Diagnosis not present

## 2021-05-01 DIAGNOSIS — M6281 Muscle weakness (generalized): Secondary | ICD-10-CM | POA: Diagnosis not present

## 2021-05-01 DIAGNOSIS — R488 Other symbolic dysfunctions: Secondary | ICD-10-CM | POA: Diagnosis not present

## 2021-05-02 DIAGNOSIS — R296 Repeated falls: Secondary | ICD-10-CM | POA: Diagnosis not present

## 2021-05-02 DIAGNOSIS — M25511 Pain in right shoulder: Secondary | ICD-10-CM | POA: Diagnosis not present

## 2021-05-02 DIAGNOSIS — R488 Other symbolic dysfunctions: Secondary | ICD-10-CM | POA: Diagnosis not present

## 2021-05-02 DIAGNOSIS — R262 Difficulty in walking, not elsewhere classified: Secondary | ICD-10-CM | POA: Diagnosis not present

## 2021-05-02 DIAGNOSIS — H353211 Exudative age-related macular degeneration, right eye, with active choroidal neovascularization: Secondary | ICD-10-CM | POA: Diagnosis not present

## 2021-05-02 DIAGNOSIS — M25512 Pain in left shoulder: Secondary | ICD-10-CM | POA: Diagnosis not present

## 2021-05-02 DIAGNOSIS — M6281 Muscle weakness (generalized): Secondary | ICD-10-CM | POA: Diagnosis not present

## 2021-05-04 DIAGNOSIS — R296 Repeated falls: Secondary | ICD-10-CM | POA: Diagnosis not present

## 2021-05-04 DIAGNOSIS — M6281 Muscle weakness (generalized): Secondary | ICD-10-CM | POA: Diagnosis not present

## 2021-05-04 DIAGNOSIS — M25512 Pain in left shoulder: Secondary | ICD-10-CM | POA: Diagnosis not present

## 2021-05-04 DIAGNOSIS — R488 Other symbolic dysfunctions: Secondary | ICD-10-CM | POA: Diagnosis not present

## 2021-05-04 DIAGNOSIS — R262 Difficulty in walking, not elsewhere classified: Secondary | ICD-10-CM | POA: Diagnosis not present

## 2021-05-04 DIAGNOSIS — M25511 Pain in right shoulder: Secondary | ICD-10-CM | POA: Diagnosis not present

## 2021-05-05 DIAGNOSIS — R488 Other symbolic dysfunctions: Secondary | ICD-10-CM | POA: Diagnosis not present

## 2021-05-05 DIAGNOSIS — M6281 Muscle weakness (generalized): Secondary | ICD-10-CM | POA: Diagnosis not present

## 2021-05-05 DIAGNOSIS — R262 Difficulty in walking, not elsewhere classified: Secondary | ICD-10-CM | POA: Diagnosis not present

## 2021-05-05 DIAGNOSIS — M25511 Pain in right shoulder: Secondary | ICD-10-CM | POA: Diagnosis not present

## 2021-05-05 DIAGNOSIS — M25512 Pain in left shoulder: Secondary | ICD-10-CM | POA: Diagnosis not present

## 2021-05-05 DIAGNOSIS — R296 Repeated falls: Secondary | ICD-10-CM | POA: Diagnosis not present

## 2021-05-07 DIAGNOSIS — M6281 Muscle weakness (generalized): Secondary | ICD-10-CM | POA: Diagnosis not present

## 2021-05-07 DIAGNOSIS — R262 Difficulty in walking, not elsewhere classified: Secondary | ICD-10-CM | POA: Diagnosis not present

## 2021-05-07 DIAGNOSIS — R488 Other symbolic dysfunctions: Secondary | ICD-10-CM | POA: Diagnosis not present

## 2021-05-07 DIAGNOSIS — M25511 Pain in right shoulder: Secondary | ICD-10-CM | POA: Diagnosis not present

## 2021-05-07 DIAGNOSIS — M25512 Pain in left shoulder: Secondary | ICD-10-CM | POA: Diagnosis not present

## 2021-05-07 DIAGNOSIS — R296 Repeated falls: Secondary | ICD-10-CM | POA: Diagnosis not present

## 2021-05-08 DIAGNOSIS — R488 Other symbolic dysfunctions: Secondary | ICD-10-CM | POA: Diagnosis not present

## 2021-05-08 DIAGNOSIS — M25511 Pain in right shoulder: Secondary | ICD-10-CM | POA: Diagnosis not present

## 2021-05-08 DIAGNOSIS — M6281 Muscle weakness (generalized): Secondary | ICD-10-CM | POA: Diagnosis not present

## 2021-05-08 DIAGNOSIS — R296 Repeated falls: Secondary | ICD-10-CM | POA: Diagnosis not present

## 2021-05-08 DIAGNOSIS — R262 Difficulty in walking, not elsewhere classified: Secondary | ICD-10-CM | POA: Diagnosis not present

## 2021-05-08 DIAGNOSIS — M25512 Pain in left shoulder: Secondary | ICD-10-CM | POA: Diagnosis not present

## 2021-05-09 DIAGNOSIS — M25512 Pain in left shoulder: Secondary | ICD-10-CM | POA: Diagnosis not present

## 2021-05-09 DIAGNOSIS — M25511 Pain in right shoulder: Secondary | ICD-10-CM | POA: Diagnosis not present

## 2021-05-09 DIAGNOSIS — R262 Difficulty in walking, not elsewhere classified: Secondary | ICD-10-CM | POA: Diagnosis not present

## 2021-05-09 DIAGNOSIS — R488 Other symbolic dysfunctions: Secondary | ICD-10-CM | POA: Diagnosis not present

## 2021-05-09 DIAGNOSIS — R296 Repeated falls: Secondary | ICD-10-CM | POA: Diagnosis not present

## 2021-05-09 DIAGNOSIS — M6281 Muscle weakness (generalized): Secondary | ICD-10-CM | POA: Diagnosis not present

## 2021-05-11 DIAGNOSIS — M25511 Pain in right shoulder: Secondary | ICD-10-CM | POA: Diagnosis not present

## 2021-05-11 DIAGNOSIS — R262 Difficulty in walking, not elsewhere classified: Secondary | ICD-10-CM | POA: Diagnosis not present

## 2021-05-11 DIAGNOSIS — R488 Other symbolic dysfunctions: Secondary | ICD-10-CM | POA: Diagnosis not present

## 2021-05-11 DIAGNOSIS — M6281 Muscle weakness (generalized): Secondary | ICD-10-CM | POA: Diagnosis not present

## 2021-05-11 DIAGNOSIS — M25512 Pain in left shoulder: Secondary | ICD-10-CM | POA: Diagnosis not present

## 2021-05-11 DIAGNOSIS — R296 Repeated falls: Secondary | ICD-10-CM | POA: Diagnosis not present

## 2021-05-14 DIAGNOSIS — M25511 Pain in right shoulder: Secondary | ICD-10-CM | POA: Diagnosis not present

## 2021-05-14 DIAGNOSIS — R262 Difficulty in walking, not elsewhere classified: Secondary | ICD-10-CM | POA: Diagnosis not present

## 2021-05-14 DIAGNOSIS — R488 Other symbolic dysfunctions: Secondary | ICD-10-CM | POA: Diagnosis not present

## 2021-05-14 DIAGNOSIS — M6281 Muscle weakness (generalized): Secondary | ICD-10-CM | POA: Diagnosis not present

## 2021-05-14 DIAGNOSIS — M25512 Pain in left shoulder: Secondary | ICD-10-CM | POA: Diagnosis not present

## 2021-05-14 DIAGNOSIS — R296 Repeated falls: Secondary | ICD-10-CM | POA: Diagnosis not present

## 2021-05-15 DIAGNOSIS — M25512 Pain in left shoulder: Secondary | ICD-10-CM | POA: Diagnosis not present

## 2021-05-15 DIAGNOSIS — R296 Repeated falls: Secondary | ICD-10-CM | POA: Diagnosis not present

## 2021-05-15 DIAGNOSIS — R262 Difficulty in walking, not elsewhere classified: Secondary | ICD-10-CM | POA: Diagnosis not present

## 2021-05-15 DIAGNOSIS — M25511 Pain in right shoulder: Secondary | ICD-10-CM | POA: Diagnosis not present

## 2021-05-15 DIAGNOSIS — R488 Other symbolic dysfunctions: Secondary | ICD-10-CM | POA: Diagnosis not present

## 2021-05-15 DIAGNOSIS — M6281 Muscle weakness (generalized): Secondary | ICD-10-CM | POA: Diagnosis not present

## 2021-05-16 DIAGNOSIS — M6281 Muscle weakness (generalized): Secondary | ICD-10-CM | POA: Diagnosis not present

## 2021-05-16 DIAGNOSIS — R488 Other symbolic dysfunctions: Secondary | ICD-10-CM | POA: Diagnosis not present

## 2021-05-16 DIAGNOSIS — R262 Difficulty in walking, not elsewhere classified: Secondary | ICD-10-CM | POA: Diagnosis not present

## 2021-05-16 DIAGNOSIS — M25512 Pain in left shoulder: Secondary | ICD-10-CM | POA: Diagnosis not present

## 2021-05-16 DIAGNOSIS — M25511 Pain in right shoulder: Secondary | ICD-10-CM | POA: Diagnosis not present

## 2021-05-16 DIAGNOSIS — R296 Repeated falls: Secondary | ICD-10-CM | POA: Diagnosis not present

## 2021-05-21 DIAGNOSIS — M6281 Muscle weakness (generalized): Secondary | ICD-10-CM | POA: Diagnosis not present

## 2021-05-21 DIAGNOSIS — M25512 Pain in left shoulder: Secondary | ICD-10-CM | POA: Diagnosis not present

## 2021-05-21 DIAGNOSIS — R296 Repeated falls: Secondary | ICD-10-CM | POA: Diagnosis not present

## 2021-05-21 DIAGNOSIS — R262 Difficulty in walking, not elsewhere classified: Secondary | ICD-10-CM | POA: Diagnosis not present

## 2021-05-21 DIAGNOSIS — R488 Other symbolic dysfunctions: Secondary | ICD-10-CM | POA: Diagnosis not present

## 2021-05-21 DIAGNOSIS — M25511 Pain in right shoulder: Secondary | ICD-10-CM | POA: Diagnosis not present

## 2021-05-22 DIAGNOSIS — M25512 Pain in left shoulder: Secondary | ICD-10-CM | POA: Diagnosis not present

## 2021-05-22 DIAGNOSIS — M25511 Pain in right shoulder: Secondary | ICD-10-CM | POA: Diagnosis not present

## 2021-05-22 DIAGNOSIS — R488 Other symbolic dysfunctions: Secondary | ICD-10-CM | POA: Diagnosis not present

## 2021-05-22 DIAGNOSIS — R262 Difficulty in walking, not elsewhere classified: Secondary | ICD-10-CM | POA: Diagnosis not present

## 2021-05-22 DIAGNOSIS — R296 Repeated falls: Secondary | ICD-10-CM | POA: Diagnosis not present

## 2021-05-22 DIAGNOSIS — M6281 Muscle weakness (generalized): Secondary | ICD-10-CM | POA: Diagnosis not present

## 2021-05-23 DIAGNOSIS — M25511 Pain in right shoulder: Secondary | ICD-10-CM | POA: Diagnosis not present

## 2021-05-23 DIAGNOSIS — R488 Other symbolic dysfunctions: Secondary | ICD-10-CM | POA: Diagnosis not present

## 2021-05-23 DIAGNOSIS — M25512 Pain in left shoulder: Secondary | ICD-10-CM | POA: Diagnosis not present

## 2021-05-23 DIAGNOSIS — R262 Difficulty in walking, not elsewhere classified: Secondary | ICD-10-CM | POA: Diagnosis not present

## 2021-05-23 DIAGNOSIS — M6281 Muscle weakness (generalized): Secondary | ICD-10-CM | POA: Diagnosis not present

## 2021-05-23 DIAGNOSIS — R296 Repeated falls: Secondary | ICD-10-CM | POA: Diagnosis not present

## 2021-05-25 DIAGNOSIS — M6281 Muscle weakness (generalized): Secondary | ICD-10-CM | POA: Diagnosis not present

## 2021-05-25 DIAGNOSIS — R296 Repeated falls: Secondary | ICD-10-CM | POA: Diagnosis not present

## 2021-05-25 DIAGNOSIS — R262 Difficulty in walking, not elsewhere classified: Secondary | ICD-10-CM | POA: Diagnosis not present

## 2021-05-25 DIAGNOSIS — R488 Other symbolic dysfunctions: Secondary | ICD-10-CM | POA: Diagnosis not present

## 2021-05-25 DIAGNOSIS — M25511 Pain in right shoulder: Secondary | ICD-10-CM | POA: Diagnosis not present

## 2021-05-25 DIAGNOSIS — M25512 Pain in left shoulder: Secondary | ICD-10-CM | POA: Diagnosis not present

## 2021-05-29 DIAGNOSIS — M25512 Pain in left shoulder: Secondary | ICD-10-CM | POA: Diagnosis not present

## 2021-05-29 DIAGNOSIS — R296 Repeated falls: Secondary | ICD-10-CM | POA: Diagnosis not present

## 2021-05-29 DIAGNOSIS — M6281 Muscle weakness (generalized): Secondary | ICD-10-CM | POA: Diagnosis not present

## 2021-05-29 DIAGNOSIS — M25511 Pain in right shoulder: Secondary | ICD-10-CM | POA: Diagnosis not present

## 2021-05-29 DIAGNOSIS — R262 Difficulty in walking, not elsewhere classified: Secondary | ICD-10-CM | POA: Diagnosis not present

## 2021-05-29 DIAGNOSIS — R488 Other symbolic dysfunctions: Secondary | ICD-10-CM | POA: Diagnosis not present

## 2021-05-30 DIAGNOSIS — R488 Other symbolic dysfunctions: Secondary | ICD-10-CM | POA: Diagnosis not present

## 2021-05-30 DIAGNOSIS — R262 Difficulty in walking, not elsewhere classified: Secondary | ICD-10-CM | POA: Diagnosis not present

## 2021-05-30 DIAGNOSIS — M25511 Pain in right shoulder: Secondary | ICD-10-CM | POA: Diagnosis not present

## 2021-05-30 DIAGNOSIS — M6281 Muscle weakness (generalized): Secondary | ICD-10-CM | POA: Diagnosis not present

## 2021-05-30 DIAGNOSIS — M25512 Pain in left shoulder: Secondary | ICD-10-CM | POA: Diagnosis not present

## 2021-05-30 DIAGNOSIS — R296 Repeated falls: Secondary | ICD-10-CM | POA: Diagnosis not present

## 2021-05-31 DIAGNOSIS — M25512 Pain in left shoulder: Secondary | ICD-10-CM | POA: Diagnosis not present

## 2021-05-31 DIAGNOSIS — M25511 Pain in right shoulder: Secondary | ICD-10-CM | POA: Diagnosis not present

## 2021-05-31 DIAGNOSIS — R262 Difficulty in walking, not elsewhere classified: Secondary | ICD-10-CM | POA: Diagnosis not present

## 2021-05-31 DIAGNOSIS — R488 Other symbolic dysfunctions: Secondary | ICD-10-CM | POA: Diagnosis not present

## 2021-05-31 DIAGNOSIS — R296 Repeated falls: Secondary | ICD-10-CM | POA: Diagnosis not present

## 2021-05-31 DIAGNOSIS — M6281 Muscle weakness (generalized): Secondary | ICD-10-CM | POA: Diagnosis not present

## 2021-06-01 DIAGNOSIS — R296 Repeated falls: Secondary | ICD-10-CM | POA: Diagnosis not present

## 2021-06-01 DIAGNOSIS — M25511 Pain in right shoulder: Secondary | ICD-10-CM | POA: Diagnosis not present

## 2021-06-01 DIAGNOSIS — R488 Other symbolic dysfunctions: Secondary | ICD-10-CM | POA: Diagnosis not present

## 2021-06-01 DIAGNOSIS — M6281 Muscle weakness (generalized): Secondary | ICD-10-CM | POA: Diagnosis not present

## 2021-06-01 DIAGNOSIS — R262 Difficulty in walking, not elsewhere classified: Secondary | ICD-10-CM | POA: Diagnosis not present

## 2021-06-01 DIAGNOSIS — M25512 Pain in left shoulder: Secondary | ICD-10-CM | POA: Diagnosis not present

## 2021-06-04 DIAGNOSIS — M25511 Pain in right shoulder: Secondary | ICD-10-CM | POA: Diagnosis not present

## 2021-06-04 DIAGNOSIS — R488 Other symbolic dysfunctions: Secondary | ICD-10-CM | POA: Diagnosis not present

## 2021-06-04 DIAGNOSIS — M6281 Muscle weakness (generalized): Secondary | ICD-10-CM | POA: Diagnosis not present

## 2021-06-04 DIAGNOSIS — R296 Repeated falls: Secondary | ICD-10-CM | POA: Diagnosis not present

## 2021-06-04 DIAGNOSIS — R262 Difficulty in walking, not elsewhere classified: Secondary | ICD-10-CM | POA: Diagnosis not present

## 2021-06-04 DIAGNOSIS — M25512 Pain in left shoulder: Secondary | ICD-10-CM | POA: Diagnosis not present

## 2021-06-05 DIAGNOSIS — M25511 Pain in right shoulder: Secondary | ICD-10-CM | POA: Diagnosis not present

## 2021-06-05 DIAGNOSIS — M25512 Pain in left shoulder: Secondary | ICD-10-CM | POA: Diagnosis not present

## 2021-06-05 DIAGNOSIS — M6281 Muscle weakness (generalized): Secondary | ICD-10-CM | POA: Diagnosis not present

## 2021-06-05 DIAGNOSIS — R262 Difficulty in walking, not elsewhere classified: Secondary | ICD-10-CM | POA: Diagnosis not present

## 2021-06-05 DIAGNOSIS — R488 Other symbolic dysfunctions: Secondary | ICD-10-CM | POA: Diagnosis not present

## 2021-06-05 DIAGNOSIS — R296 Repeated falls: Secondary | ICD-10-CM | POA: Diagnosis not present

## 2021-06-06 DIAGNOSIS — M25512 Pain in left shoulder: Secondary | ICD-10-CM | POA: Diagnosis not present

## 2021-06-06 DIAGNOSIS — R262 Difficulty in walking, not elsewhere classified: Secondary | ICD-10-CM | POA: Diagnosis not present

## 2021-06-06 DIAGNOSIS — R296 Repeated falls: Secondary | ICD-10-CM | POA: Diagnosis not present

## 2021-06-06 DIAGNOSIS — M6281 Muscle weakness (generalized): Secondary | ICD-10-CM | POA: Diagnosis not present

## 2021-06-06 DIAGNOSIS — M25511 Pain in right shoulder: Secondary | ICD-10-CM | POA: Diagnosis not present

## 2021-06-06 DIAGNOSIS — R488 Other symbolic dysfunctions: Secondary | ICD-10-CM | POA: Diagnosis not present

## 2021-06-11 DIAGNOSIS — M25512 Pain in left shoulder: Secondary | ICD-10-CM | POA: Diagnosis not present

## 2021-06-11 DIAGNOSIS — M6281 Muscle weakness (generalized): Secondary | ICD-10-CM | POA: Diagnosis not present

## 2021-06-11 DIAGNOSIS — R262 Difficulty in walking, not elsewhere classified: Secondary | ICD-10-CM | POA: Diagnosis not present

## 2021-06-11 DIAGNOSIS — R488 Other symbolic dysfunctions: Secondary | ICD-10-CM | POA: Diagnosis not present

## 2021-06-11 DIAGNOSIS — R296 Repeated falls: Secondary | ICD-10-CM | POA: Diagnosis not present

## 2021-06-11 DIAGNOSIS — M25511 Pain in right shoulder: Secondary | ICD-10-CM | POA: Diagnosis not present

## 2021-06-12 DIAGNOSIS — R262 Difficulty in walking, not elsewhere classified: Secondary | ICD-10-CM | POA: Diagnosis not present

## 2021-06-12 DIAGNOSIS — R488 Other symbolic dysfunctions: Secondary | ICD-10-CM | POA: Diagnosis not present

## 2021-06-12 DIAGNOSIS — M25511 Pain in right shoulder: Secondary | ICD-10-CM | POA: Diagnosis not present

## 2021-06-12 DIAGNOSIS — R296 Repeated falls: Secondary | ICD-10-CM | POA: Diagnosis not present

## 2021-06-12 DIAGNOSIS — M25512 Pain in left shoulder: Secondary | ICD-10-CM | POA: Diagnosis not present

## 2021-06-12 DIAGNOSIS — M6281 Muscle weakness (generalized): Secondary | ICD-10-CM | POA: Diagnosis not present

## 2021-06-13 DIAGNOSIS — N3946 Mixed incontinence: Secondary | ICD-10-CM | POA: Diagnosis not present

## 2021-06-13 DIAGNOSIS — M25511 Pain in right shoulder: Secondary | ICD-10-CM | POA: Diagnosis not present

## 2021-06-13 DIAGNOSIS — M6281 Muscle weakness (generalized): Secondary | ICD-10-CM | POA: Diagnosis not present

## 2021-06-13 DIAGNOSIS — M25512 Pain in left shoulder: Secondary | ICD-10-CM | POA: Diagnosis not present

## 2021-06-13 DIAGNOSIS — N3944 Nocturnal enuresis: Secondary | ICD-10-CM | POA: Diagnosis not present

## 2021-06-13 DIAGNOSIS — R262 Difficulty in walking, not elsewhere classified: Secondary | ICD-10-CM | POA: Diagnosis not present

## 2021-06-13 DIAGNOSIS — R296 Repeated falls: Secondary | ICD-10-CM | POA: Diagnosis not present

## 2021-06-13 DIAGNOSIS — R488 Other symbolic dysfunctions: Secondary | ICD-10-CM | POA: Diagnosis not present

## 2021-06-14 DIAGNOSIS — N3000 Acute cystitis without hematuria: Secondary | ICD-10-CM | POA: Diagnosis not present

## 2021-06-14 DIAGNOSIS — M6281 Muscle weakness (generalized): Secondary | ICD-10-CM | POA: Diagnosis not present

## 2021-06-14 DIAGNOSIS — R296 Repeated falls: Secondary | ICD-10-CM | POA: Diagnosis not present

## 2021-06-14 DIAGNOSIS — R488 Other symbolic dysfunctions: Secondary | ICD-10-CM | POA: Diagnosis not present

## 2021-06-14 DIAGNOSIS — R262 Difficulty in walking, not elsewhere classified: Secondary | ICD-10-CM | POA: Diagnosis not present

## 2021-06-14 DIAGNOSIS — M25511 Pain in right shoulder: Secondary | ICD-10-CM | POA: Diagnosis not present

## 2021-06-14 DIAGNOSIS — M25512 Pain in left shoulder: Secondary | ICD-10-CM | POA: Diagnosis not present

## 2021-06-19 DIAGNOSIS — M6281 Muscle weakness (generalized): Secondary | ICD-10-CM | POA: Diagnosis not present

## 2021-06-19 DIAGNOSIS — M25511 Pain in right shoulder: Secondary | ICD-10-CM | POA: Diagnosis not present

## 2021-06-19 DIAGNOSIS — R262 Difficulty in walking, not elsewhere classified: Secondary | ICD-10-CM | POA: Diagnosis not present

## 2021-06-19 DIAGNOSIS — R488 Other symbolic dysfunctions: Secondary | ICD-10-CM | POA: Diagnosis not present

## 2021-06-19 DIAGNOSIS — R296 Repeated falls: Secondary | ICD-10-CM | POA: Diagnosis not present

## 2021-06-19 DIAGNOSIS — M25512 Pain in left shoulder: Secondary | ICD-10-CM | POA: Diagnosis not present

## 2021-06-20 DIAGNOSIS — H353211 Exudative age-related macular degeneration, right eye, with active choroidal neovascularization: Secondary | ICD-10-CM | POA: Diagnosis not present

## 2021-06-21 DIAGNOSIS — M25511 Pain in right shoulder: Secondary | ICD-10-CM | POA: Diagnosis not present

## 2021-06-21 DIAGNOSIS — M25512 Pain in left shoulder: Secondary | ICD-10-CM | POA: Diagnosis not present

## 2021-06-21 DIAGNOSIS — R262 Difficulty in walking, not elsewhere classified: Secondary | ICD-10-CM | POA: Diagnosis not present

## 2021-06-21 DIAGNOSIS — M6281 Muscle weakness (generalized): Secondary | ICD-10-CM | POA: Diagnosis not present

## 2021-06-21 DIAGNOSIS — R488 Other symbolic dysfunctions: Secondary | ICD-10-CM | POA: Diagnosis not present

## 2021-06-21 DIAGNOSIS — R296 Repeated falls: Secondary | ICD-10-CM | POA: Diagnosis not present

## 2021-06-25 DIAGNOSIS — K219 Gastro-esophageal reflux disease without esophagitis: Secondary | ICD-10-CM | POA: Diagnosis not present

## 2021-06-25 DIAGNOSIS — I493 Ventricular premature depolarization: Secondary | ICD-10-CM | POA: Diagnosis not present

## 2021-06-25 DIAGNOSIS — R7989 Other specified abnormal findings of blood chemistry: Secondary | ICD-10-CM | POA: Diagnosis not present

## 2021-06-25 DIAGNOSIS — Z8673 Personal history of transient ischemic attack (TIA), and cerebral infarction without residual deficits: Secondary | ICD-10-CM | POA: Diagnosis not present

## 2021-06-25 DIAGNOSIS — Z96659 Presence of unspecified artificial knee joint: Secondary | ICD-10-CM | POA: Diagnosis not present

## 2021-06-25 DIAGNOSIS — I214 Non-ST elevation (NSTEMI) myocardial infarction: Secondary | ICD-10-CM | POA: Diagnosis not present

## 2021-06-25 DIAGNOSIS — R231 Pallor: Secondary | ICD-10-CM | POA: Diagnosis not present

## 2021-06-25 DIAGNOSIS — Z9049 Acquired absence of other specified parts of digestive tract: Secondary | ICD-10-CM | POA: Diagnosis not present

## 2021-06-25 DIAGNOSIS — J80 Acute respiratory distress syndrome: Secondary | ICD-10-CM | POA: Diagnosis not present

## 2021-06-25 DIAGNOSIS — R0902 Hypoxemia: Secondary | ICD-10-CM | POA: Diagnosis not present

## 2021-06-25 DIAGNOSIS — R0603 Acute respiratory distress: Secondary | ICD-10-CM | POA: Diagnosis not present

## 2021-06-25 DIAGNOSIS — U071 COVID-19: Secondary | ICD-10-CM | POA: Diagnosis not present

## 2021-06-25 DIAGNOSIS — Z79899 Other long term (current) drug therapy: Secondary | ICD-10-CM | POA: Diagnosis not present

## 2021-06-25 DIAGNOSIS — K5791 Diverticulosis of intestine, part unspecified, without perforation or abscess with bleeding: Secondary | ICD-10-CM | POA: Diagnosis not present

## 2021-06-25 DIAGNOSIS — I5021 Acute systolic (congestive) heart failure: Secondary | ICD-10-CM | POA: Diagnosis not present

## 2021-06-25 DIAGNOSIS — I5181 Takotsubo syndrome: Secondary | ICD-10-CM | POA: Diagnosis not present

## 2021-06-25 DIAGNOSIS — Z9981 Dependence on supplemental oxygen: Secondary | ICD-10-CM | POA: Diagnosis not present

## 2021-06-25 DIAGNOSIS — Z791 Long term (current) use of non-steroidal anti-inflammatories (NSAID): Secondary | ICD-10-CM | POA: Diagnosis not present

## 2021-06-25 DIAGNOSIS — Z9119 Patient's noncompliance with other medical treatment and regimen: Secondary | ICD-10-CM | POA: Diagnosis not present

## 2021-06-25 DIAGNOSIS — I110 Hypertensive heart disease with heart failure: Secondary | ICD-10-CM | POA: Diagnosis not present

## 2021-06-25 DIAGNOSIS — T380X5A Adverse effect of glucocorticoids and synthetic analogues, initial encounter: Secondary | ICD-10-CM | POA: Diagnosis not present

## 2021-06-25 DIAGNOSIS — G4733 Obstructive sleep apnea (adult) (pediatric): Secondary | ICD-10-CM | POA: Diagnosis not present

## 2021-06-25 DIAGNOSIS — I10 Essential (primary) hypertension: Secondary | ICD-10-CM | POA: Diagnosis not present

## 2021-06-25 DIAGNOSIS — J1282 Pneumonia due to coronavirus disease 2019: Secondary | ICD-10-CM | POA: Diagnosis not present

## 2021-06-25 DIAGNOSIS — I5022 Chronic systolic (congestive) heart failure: Secondary | ICD-10-CM | POA: Diagnosis not present

## 2021-06-25 DIAGNOSIS — G9340 Encephalopathy, unspecified: Secondary | ICD-10-CM | POA: Diagnosis not present

## 2021-06-25 DIAGNOSIS — R778 Other specified abnormalities of plasma proteins: Secondary | ICD-10-CM | POA: Diagnosis not present

## 2021-06-25 DIAGNOSIS — R0689 Other abnormalities of breathing: Secondary | ICD-10-CM | POA: Diagnosis not present

## 2021-06-25 DIAGNOSIS — F329 Major depressive disorder, single episode, unspecified: Secondary | ICD-10-CM | POA: Diagnosis not present

## 2021-06-25 DIAGNOSIS — R531 Weakness: Secondary | ICD-10-CM | POA: Diagnosis not present

## 2021-06-25 DIAGNOSIS — J9811 Atelectasis: Secondary | ICD-10-CM | POA: Diagnosis not present

## 2021-06-25 DIAGNOSIS — F0150 Vascular dementia without behavioral disturbance: Secondary | ICD-10-CM | POA: Diagnosis not present

## 2021-06-25 DIAGNOSIS — Z87891 Personal history of nicotine dependence: Secondary | ICD-10-CM | POA: Diagnosis not present

## 2021-06-25 DIAGNOSIS — E785 Hyperlipidemia, unspecified: Secondary | ICD-10-CM | POA: Diagnosis not present

## 2021-06-25 DIAGNOSIS — I447 Left bundle-branch block, unspecified: Secondary | ICD-10-CM | POA: Diagnosis not present

## 2021-06-25 DIAGNOSIS — Z7982 Long term (current) use of aspirin: Secondary | ICD-10-CM | POA: Diagnosis not present

## 2021-06-25 DIAGNOSIS — Z9989 Dependence on other enabling machines and devices: Secondary | ICD-10-CM | POA: Diagnosis not present

## 2021-06-25 DIAGNOSIS — R0602 Shortness of breath: Secondary | ICD-10-CM | POA: Diagnosis not present

## 2021-06-25 DIAGNOSIS — J9601 Acute respiratory failure with hypoxia: Secondary | ICD-10-CM | POA: Diagnosis not present

## 2021-06-26 DIAGNOSIS — F0150 Vascular dementia without behavioral disturbance: Secondary | ICD-10-CM | POA: Diagnosis not present

## 2021-06-26 DIAGNOSIS — U071 COVID-19: Secondary | ICD-10-CM | POA: Diagnosis not present

## 2021-06-26 DIAGNOSIS — J9601 Acute respiratory failure with hypoxia: Secondary | ICD-10-CM | POA: Diagnosis not present

## 2021-06-26 DIAGNOSIS — I214 Non-ST elevation (NSTEMI) myocardial infarction: Secondary | ICD-10-CM | POA: Diagnosis not present

## 2021-06-27 DIAGNOSIS — I447 Left bundle-branch block, unspecified: Secondary | ICD-10-CM | POA: Diagnosis not present

## 2021-06-27 DIAGNOSIS — Z9989 Dependence on other enabling machines and devices: Secondary | ICD-10-CM | POA: Diagnosis not present

## 2021-06-27 DIAGNOSIS — R7989 Other specified abnormal findings of blood chemistry: Secondary | ICD-10-CM | POA: Diagnosis not present

## 2021-06-27 DIAGNOSIS — Z9981 Dependence on supplemental oxygen: Secondary | ICD-10-CM | POA: Diagnosis not present

## 2021-06-27 DIAGNOSIS — I5021 Acute systolic (congestive) heart failure: Secondary | ICD-10-CM | POA: Diagnosis not present

## 2021-06-27 DIAGNOSIS — G4733 Obstructive sleep apnea (adult) (pediatric): Secondary | ICD-10-CM | POA: Diagnosis not present

## 2021-06-27 DIAGNOSIS — J9601 Acute respiratory failure with hypoxia: Secondary | ICD-10-CM | POA: Diagnosis not present

## 2021-06-27 DIAGNOSIS — I214 Non-ST elevation (NSTEMI) myocardial infarction: Secondary | ICD-10-CM | POA: Diagnosis not present

## 2021-06-27 DIAGNOSIS — I110 Hypertensive heart disease with heart failure: Secondary | ICD-10-CM | POA: Diagnosis not present

## 2021-06-27 DIAGNOSIS — Z8673 Personal history of transient ischemic attack (TIA), and cerebral infarction without residual deficits: Secondary | ICD-10-CM | POA: Diagnosis not present

## 2021-06-27 DIAGNOSIS — E785 Hyperlipidemia, unspecified: Secondary | ICD-10-CM | POA: Diagnosis not present

## 2021-06-27 DIAGNOSIS — R0602 Shortness of breath: Secondary | ICD-10-CM | POA: Diagnosis not present

## 2021-06-27 DIAGNOSIS — U071 COVID-19: Secondary | ICD-10-CM | POA: Diagnosis not present

## 2021-06-28 DIAGNOSIS — I5022 Chronic systolic (congestive) heart failure: Secondary | ICD-10-CM | POA: Diagnosis not present

## 2021-06-28 DIAGNOSIS — F0150 Vascular dementia without behavioral disturbance: Secondary | ICD-10-CM | POA: Diagnosis not present

## 2021-06-28 DIAGNOSIS — J9601 Acute respiratory failure with hypoxia: Secondary | ICD-10-CM | POA: Diagnosis not present

## 2021-06-28 DIAGNOSIS — U071 COVID-19: Secondary | ICD-10-CM | POA: Diagnosis not present

## 2021-06-28 DIAGNOSIS — J1282 Pneumonia due to coronavirus disease 2019: Secondary | ICD-10-CM | POA: Diagnosis not present

## 2021-07-04 DIAGNOSIS — R296 Repeated falls: Secondary | ICD-10-CM | POA: Diagnosis not present

## 2021-07-04 DIAGNOSIS — R262 Difficulty in walking, not elsewhere classified: Secondary | ICD-10-CM | POA: Diagnosis not present

## 2021-07-04 DIAGNOSIS — M25512 Pain in left shoulder: Secondary | ICD-10-CM | POA: Diagnosis not present

## 2021-07-04 DIAGNOSIS — M25511 Pain in right shoulder: Secondary | ICD-10-CM | POA: Diagnosis not present

## 2021-07-04 DIAGNOSIS — R488 Other symbolic dysfunctions: Secondary | ICD-10-CM | POA: Diagnosis not present

## 2021-07-04 DIAGNOSIS — M6281 Muscle weakness (generalized): Secondary | ICD-10-CM | POA: Diagnosis not present

## 2021-07-05 DIAGNOSIS — R262 Difficulty in walking, not elsewhere classified: Secondary | ICD-10-CM | POA: Diagnosis not present

## 2021-07-05 DIAGNOSIS — R296 Repeated falls: Secondary | ICD-10-CM | POA: Diagnosis not present

## 2021-07-05 DIAGNOSIS — M25512 Pain in left shoulder: Secondary | ICD-10-CM | POA: Diagnosis not present

## 2021-07-05 DIAGNOSIS — M25511 Pain in right shoulder: Secondary | ICD-10-CM | POA: Diagnosis not present

## 2021-07-05 DIAGNOSIS — R488 Other symbolic dysfunctions: Secondary | ICD-10-CM | POA: Diagnosis not present

## 2021-07-05 DIAGNOSIS — M6281 Muscle weakness (generalized): Secondary | ICD-10-CM | POA: Diagnosis not present

## 2021-07-08 DIAGNOSIS — R262 Difficulty in walking, not elsewhere classified: Secondary | ICD-10-CM | POA: Diagnosis not present

## 2021-07-08 DIAGNOSIS — M25511 Pain in right shoulder: Secondary | ICD-10-CM | POA: Diagnosis not present

## 2021-07-08 DIAGNOSIS — R296 Repeated falls: Secondary | ICD-10-CM | POA: Diagnosis not present

## 2021-07-08 DIAGNOSIS — R488 Other symbolic dysfunctions: Secondary | ICD-10-CM | POA: Diagnosis not present

## 2021-07-08 DIAGNOSIS — M6281 Muscle weakness (generalized): Secondary | ICD-10-CM | POA: Diagnosis not present

## 2021-07-08 DIAGNOSIS — M25512 Pain in left shoulder: Secondary | ICD-10-CM | POA: Diagnosis not present

## 2021-07-10 DIAGNOSIS — M25511 Pain in right shoulder: Secondary | ICD-10-CM | POA: Diagnosis not present

## 2021-07-10 DIAGNOSIS — M25512 Pain in left shoulder: Secondary | ICD-10-CM | POA: Diagnosis not present

## 2021-07-10 DIAGNOSIS — R262 Difficulty in walking, not elsewhere classified: Secondary | ICD-10-CM | POA: Diagnosis not present

## 2021-07-10 DIAGNOSIS — R296 Repeated falls: Secondary | ICD-10-CM | POA: Diagnosis not present

## 2021-07-10 DIAGNOSIS — M6281 Muscle weakness (generalized): Secondary | ICD-10-CM | POA: Diagnosis not present

## 2021-07-10 DIAGNOSIS — R488 Other symbolic dysfunctions: Secondary | ICD-10-CM | POA: Diagnosis not present

## 2021-07-11 DIAGNOSIS — M25512 Pain in left shoulder: Secondary | ICD-10-CM | POA: Diagnosis not present

## 2021-07-11 DIAGNOSIS — R300 Dysuria: Secondary | ICD-10-CM | POA: Diagnosis not present

## 2021-07-11 DIAGNOSIS — E7800 Pure hypercholesterolemia, unspecified: Secondary | ICD-10-CM | POA: Diagnosis not present

## 2021-07-11 DIAGNOSIS — R488 Other symbolic dysfunctions: Secondary | ICD-10-CM | POA: Diagnosis not present

## 2021-07-11 DIAGNOSIS — F321 Major depressive disorder, single episode, moderate: Secondary | ICD-10-CM | POA: Diagnosis not present

## 2021-07-11 DIAGNOSIS — M25511 Pain in right shoulder: Secondary | ICD-10-CM | POA: Diagnosis not present

## 2021-07-11 DIAGNOSIS — R262 Difficulty in walking, not elsewhere classified: Secondary | ICD-10-CM | POA: Diagnosis not present

## 2021-07-11 DIAGNOSIS — I5181 Takotsubo syndrome: Secondary | ICD-10-CM | POA: Diagnosis not present

## 2021-07-11 DIAGNOSIS — I5022 Chronic systolic (congestive) heart failure: Secondary | ICD-10-CM | POA: Diagnosis not present

## 2021-07-11 DIAGNOSIS — M6281 Muscle weakness (generalized): Secondary | ICD-10-CM | POA: Diagnosis not present

## 2021-07-11 DIAGNOSIS — R296 Repeated falls: Secondary | ICD-10-CM | POA: Diagnosis not present

## 2021-07-16 DIAGNOSIS — R262 Difficulty in walking, not elsewhere classified: Secondary | ICD-10-CM | POA: Diagnosis not present

## 2021-07-16 DIAGNOSIS — R296 Repeated falls: Secondary | ICD-10-CM | POA: Diagnosis not present

## 2021-07-16 DIAGNOSIS — M25511 Pain in right shoulder: Secondary | ICD-10-CM | POA: Diagnosis not present

## 2021-07-16 DIAGNOSIS — R488 Other symbolic dysfunctions: Secondary | ICD-10-CM | POA: Diagnosis not present

## 2021-07-16 DIAGNOSIS — M25512 Pain in left shoulder: Secondary | ICD-10-CM | POA: Diagnosis not present

## 2021-07-16 DIAGNOSIS — M6281 Muscle weakness (generalized): Secondary | ICD-10-CM | POA: Diagnosis not present

## 2021-07-17 DIAGNOSIS — M25511 Pain in right shoulder: Secondary | ICD-10-CM | POA: Diagnosis not present

## 2021-07-17 DIAGNOSIS — M6281 Muscle weakness (generalized): Secondary | ICD-10-CM | POA: Diagnosis not present

## 2021-07-17 DIAGNOSIS — R262 Difficulty in walking, not elsewhere classified: Secondary | ICD-10-CM | POA: Diagnosis not present

## 2021-07-17 DIAGNOSIS — R296 Repeated falls: Secondary | ICD-10-CM | POA: Diagnosis not present

## 2021-07-17 DIAGNOSIS — M25512 Pain in left shoulder: Secondary | ICD-10-CM | POA: Diagnosis not present

## 2021-07-17 DIAGNOSIS — R488 Other symbolic dysfunctions: Secondary | ICD-10-CM | POA: Diagnosis not present

## 2021-07-18 DIAGNOSIS — M6281 Muscle weakness (generalized): Secondary | ICD-10-CM | POA: Diagnosis not present

## 2021-07-18 DIAGNOSIS — R296 Repeated falls: Secondary | ICD-10-CM | POA: Diagnosis not present

## 2021-07-18 DIAGNOSIS — R262 Difficulty in walking, not elsewhere classified: Secondary | ICD-10-CM | POA: Diagnosis not present

## 2021-07-18 DIAGNOSIS — M25512 Pain in left shoulder: Secondary | ICD-10-CM | POA: Diagnosis not present

## 2021-07-18 DIAGNOSIS — R488 Other symbolic dysfunctions: Secondary | ICD-10-CM | POA: Diagnosis not present

## 2021-07-18 DIAGNOSIS — M25511 Pain in right shoulder: Secondary | ICD-10-CM | POA: Diagnosis not present

## 2021-07-20 DIAGNOSIS — M25511 Pain in right shoulder: Secondary | ICD-10-CM | POA: Diagnosis not present

## 2021-07-20 DIAGNOSIS — M25512 Pain in left shoulder: Secondary | ICD-10-CM | POA: Diagnosis not present

## 2021-07-20 DIAGNOSIS — R262 Difficulty in walking, not elsewhere classified: Secondary | ICD-10-CM | POA: Diagnosis not present

## 2021-07-20 DIAGNOSIS — M6281 Muscle weakness (generalized): Secondary | ICD-10-CM | POA: Diagnosis not present

## 2021-07-20 DIAGNOSIS — R488 Other symbolic dysfunctions: Secondary | ICD-10-CM | POA: Diagnosis not present

## 2021-07-20 DIAGNOSIS — R296 Repeated falls: Secondary | ICD-10-CM | POA: Diagnosis not present

## 2021-07-23 DIAGNOSIS — M6281 Muscle weakness (generalized): Secondary | ICD-10-CM | POA: Diagnosis not present

## 2021-07-23 DIAGNOSIS — R262 Difficulty in walking, not elsewhere classified: Secondary | ICD-10-CM | POA: Diagnosis not present

## 2021-07-23 DIAGNOSIS — M25512 Pain in left shoulder: Secondary | ICD-10-CM | POA: Diagnosis not present

## 2021-07-23 DIAGNOSIS — R488 Other symbolic dysfunctions: Secondary | ICD-10-CM | POA: Diagnosis not present

## 2021-07-23 DIAGNOSIS — R296 Repeated falls: Secondary | ICD-10-CM | POA: Diagnosis not present

## 2021-07-23 DIAGNOSIS — M25511 Pain in right shoulder: Secondary | ICD-10-CM | POA: Diagnosis not present

## 2021-07-24 DIAGNOSIS — M25512 Pain in left shoulder: Secondary | ICD-10-CM | POA: Diagnosis not present

## 2021-07-24 DIAGNOSIS — M25511 Pain in right shoulder: Secondary | ICD-10-CM | POA: Diagnosis not present

## 2021-07-24 DIAGNOSIS — R262 Difficulty in walking, not elsewhere classified: Secondary | ICD-10-CM | POA: Diagnosis not present

## 2021-07-24 DIAGNOSIS — R488 Other symbolic dysfunctions: Secondary | ICD-10-CM | POA: Diagnosis not present

## 2021-07-24 DIAGNOSIS — R296 Repeated falls: Secondary | ICD-10-CM | POA: Diagnosis not present

## 2021-07-24 DIAGNOSIS — M6281 Muscle weakness (generalized): Secondary | ICD-10-CM | POA: Diagnosis not present

## 2021-07-25 DIAGNOSIS — N3946 Mixed incontinence: Secondary | ICD-10-CM | POA: Diagnosis not present

## 2021-07-25 DIAGNOSIS — R488 Other symbolic dysfunctions: Secondary | ICD-10-CM | POA: Diagnosis not present

## 2021-07-25 DIAGNOSIS — M25512 Pain in left shoulder: Secondary | ICD-10-CM | POA: Diagnosis not present

## 2021-07-25 DIAGNOSIS — R262 Difficulty in walking, not elsewhere classified: Secondary | ICD-10-CM | POA: Diagnosis not present

## 2021-07-25 DIAGNOSIS — R296 Repeated falls: Secondary | ICD-10-CM | POA: Diagnosis not present

## 2021-07-25 DIAGNOSIS — M6281 Muscle weakness (generalized): Secondary | ICD-10-CM | POA: Diagnosis not present

## 2021-07-25 DIAGNOSIS — M25511 Pain in right shoulder: Secondary | ICD-10-CM | POA: Diagnosis not present

## 2021-07-26 DIAGNOSIS — M25512 Pain in left shoulder: Secondary | ICD-10-CM | POA: Diagnosis not present

## 2021-07-26 DIAGNOSIS — R488 Other symbolic dysfunctions: Secondary | ICD-10-CM | POA: Diagnosis not present

## 2021-07-26 DIAGNOSIS — M6281 Muscle weakness (generalized): Secondary | ICD-10-CM | POA: Diagnosis not present

## 2021-07-26 DIAGNOSIS — R296 Repeated falls: Secondary | ICD-10-CM | POA: Diagnosis not present

## 2021-07-26 DIAGNOSIS — R262 Difficulty in walking, not elsewhere classified: Secondary | ICD-10-CM | POA: Diagnosis not present

## 2021-07-26 DIAGNOSIS — M25511 Pain in right shoulder: Secondary | ICD-10-CM | POA: Diagnosis not present

## 2021-07-27 DIAGNOSIS — M6281 Muscle weakness (generalized): Secondary | ICD-10-CM | POA: Diagnosis not present

## 2021-07-27 DIAGNOSIS — R262 Difficulty in walking, not elsewhere classified: Secondary | ICD-10-CM | POA: Diagnosis not present

## 2021-07-27 DIAGNOSIS — M25512 Pain in left shoulder: Secondary | ICD-10-CM | POA: Diagnosis not present

## 2021-07-27 DIAGNOSIS — R488 Other symbolic dysfunctions: Secondary | ICD-10-CM | POA: Diagnosis not present

## 2021-07-27 DIAGNOSIS — M25511 Pain in right shoulder: Secondary | ICD-10-CM | POA: Diagnosis not present

## 2021-07-27 DIAGNOSIS — R296 Repeated falls: Secondary | ICD-10-CM | POA: Diagnosis not present

## 2021-07-30 DIAGNOSIS — M6281 Muscle weakness (generalized): Secondary | ICD-10-CM | POA: Diagnosis not present

## 2021-07-30 DIAGNOSIS — M25512 Pain in left shoulder: Secondary | ICD-10-CM | POA: Diagnosis not present

## 2021-07-30 DIAGNOSIS — R296 Repeated falls: Secondary | ICD-10-CM | POA: Diagnosis not present

## 2021-07-30 DIAGNOSIS — M25511 Pain in right shoulder: Secondary | ICD-10-CM | POA: Diagnosis not present

## 2021-07-30 DIAGNOSIS — R262 Difficulty in walking, not elsewhere classified: Secondary | ICD-10-CM | POA: Diagnosis not present

## 2021-07-30 DIAGNOSIS — R488 Other symbolic dysfunctions: Secondary | ICD-10-CM | POA: Diagnosis not present

## 2021-07-31 DIAGNOSIS — M6281 Muscle weakness (generalized): Secondary | ICD-10-CM | POA: Diagnosis not present

## 2021-07-31 DIAGNOSIS — M25511 Pain in right shoulder: Secondary | ICD-10-CM | POA: Diagnosis not present

## 2021-07-31 DIAGNOSIS — M25512 Pain in left shoulder: Secondary | ICD-10-CM | POA: Diagnosis not present

## 2021-07-31 DIAGNOSIS — R488 Other symbolic dysfunctions: Secondary | ICD-10-CM | POA: Diagnosis not present

## 2021-07-31 DIAGNOSIS — R296 Repeated falls: Secondary | ICD-10-CM | POA: Diagnosis not present

## 2021-07-31 DIAGNOSIS — R262 Difficulty in walking, not elsewhere classified: Secondary | ICD-10-CM | POA: Diagnosis not present

## 2021-08-01 DIAGNOSIS — H353211 Exudative age-related macular degeneration, right eye, with active choroidal neovascularization: Secondary | ICD-10-CM | POA: Diagnosis not present

## 2021-08-02 DIAGNOSIS — R262 Difficulty in walking, not elsewhere classified: Secondary | ICD-10-CM | POA: Diagnosis not present

## 2021-08-02 DIAGNOSIS — R296 Repeated falls: Secondary | ICD-10-CM | POA: Diagnosis not present

## 2021-08-02 DIAGNOSIS — M25512 Pain in left shoulder: Secondary | ICD-10-CM | POA: Diagnosis not present

## 2021-08-02 DIAGNOSIS — R488 Other symbolic dysfunctions: Secondary | ICD-10-CM | POA: Diagnosis not present

## 2021-08-02 DIAGNOSIS — M6281 Muscle weakness (generalized): Secondary | ICD-10-CM | POA: Diagnosis not present

## 2021-08-02 DIAGNOSIS — M25511 Pain in right shoulder: Secondary | ICD-10-CM | POA: Diagnosis not present

## 2021-08-06 DIAGNOSIS — R262 Difficulty in walking, not elsewhere classified: Secondary | ICD-10-CM | POA: Diagnosis not present

## 2021-08-06 DIAGNOSIS — M25512 Pain in left shoulder: Secondary | ICD-10-CM | POA: Diagnosis not present

## 2021-08-06 DIAGNOSIS — R488 Other symbolic dysfunctions: Secondary | ICD-10-CM | POA: Diagnosis not present

## 2021-08-06 DIAGNOSIS — R296 Repeated falls: Secondary | ICD-10-CM | POA: Diagnosis not present

## 2021-08-06 DIAGNOSIS — M6281 Muscle weakness (generalized): Secondary | ICD-10-CM | POA: Diagnosis not present

## 2021-08-06 DIAGNOSIS — M25511 Pain in right shoulder: Secondary | ICD-10-CM | POA: Diagnosis not present

## 2021-08-07 DIAGNOSIS — R488 Other symbolic dysfunctions: Secondary | ICD-10-CM | POA: Diagnosis not present

## 2021-08-07 DIAGNOSIS — R262 Difficulty in walking, not elsewhere classified: Secondary | ICD-10-CM | POA: Diagnosis not present

## 2021-08-07 DIAGNOSIS — I5022 Chronic systolic (congestive) heart failure: Secondary | ICD-10-CM | POA: Diagnosis not present

## 2021-08-07 DIAGNOSIS — M25512 Pain in left shoulder: Secondary | ICD-10-CM | POA: Diagnosis not present

## 2021-08-07 DIAGNOSIS — M6281 Muscle weakness (generalized): Secondary | ICD-10-CM | POA: Diagnosis not present

## 2021-08-07 DIAGNOSIS — Z79899 Other long term (current) drug therapy: Secondary | ICD-10-CM | POA: Diagnosis not present

## 2021-08-07 DIAGNOSIS — R296 Repeated falls: Secondary | ICD-10-CM | POA: Diagnosis not present

## 2021-08-07 DIAGNOSIS — Z23 Encounter for immunization: Secondary | ICD-10-CM | POA: Diagnosis not present

## 2021-08-07 DIAGNOSIS — M25511 Pain in right shoulder: Secondary | ICD-10-CM | POA: Diagnosis not present

## 2021-08-07 DIAGNOSIS — I5181 Takotsubo syndrome: Secondary | ICD-10-CM | POA: Diagnosis not present

## 2021-08-07 DIAGNOSIS — F0390 Unspecified dementia without behavioral disturbance: Secondary | ICD-10-CM | POA: Diagnosis not present

## 2021-08-07 DIAGNOSIS — E7800 Pure hypercholesterolemia, unspecified: Secondary | ICD-10-CM | POA: Diagnosis not present

## 2021-08-07 DIAGNOSIS — K219 Gastro-esophageal reflux disease without esophagitis: Secondary | ICD-10-CM | POA: Diagnosis not present

## 2021-08-07 DIAGNOSIS — F321 Major depressive disorder, single episode, moderate: Secondary | ICD-10-CM | POA: Diagnosis not present

## 2021-08-07 DIAGNOSIS — Z0001 Encounter for general adult medical examination with abnormal findings: Secondary | ICD-10-CM | POA: Diagnosis not present

## 2021-08-08 DIAGNOSIS — R488 Other symbolic dysfunctions: Secondary | ICD-10-CM | POA: Diagnosis not present

## 2021-08-08 DIAGNOSIS — M25511 Pain in right shoulder: Secondary | ICD-10-CM | POA: Diagnosis not present

## 2021-08-08 DIAGNOSIS — R262 Difficulty in walking, not elsewhere classified: Secondary | ICD-10-CM | POA: Diagnosis not present

## 2021-08-08 DIAGNOSIS — M25512 Pain in left shoulder: Secondary | ICD-10-CM | POA: Diagnosis not present

## 2021-08-08 DIAGNOSIS — M6281 Muscle weakness (generalized): Secondary | ICD-10-CM | POA: Diagnosis not present

## 2021-08-08 DIAGNOSIS — R296 Repeated falls: Secondary | ICD-10-CM | POA: Diagnosis not present

## 2021-08-09 DIAGNOSIS — R262 Difficulty in walking, not elsewhere classified: Secondary | ICD-10-CM | POA: Diagnosis not present

## 2021-08-09 DIAGNOSIS — R488 Other symbolic dysfunctions: Secondary | ICD-10-CM | POA: Diagnosis not present

## 2021-08-09 DIAGNOSIS — R296 Repeated falls: Secondary | ICD-10-CM | POA: Diagnosis not present

## 2021-08-09 DIAGNOSIS — M25511 Pain in right shoulder: Secondary | ICD-10-CM | POA: Diagnosis not present

## 2021-08-09 DIAGNOSIS — M25512 Pain in left shoulder: Secondary | ICD-10-CM | POA: Diagnosis not present

## 2021-08-09 DIAGNOSIS — M6281 Muscle weakness (generalized): Secondary | ICD-10-CM | POA: Diagnosis not present

## 2021-08-10 DIAGNOSIS — M25511 Pain in right shoulder: Secondary | ICD-10-CM | POA: Diagnosis not present

## 2021-08-10 DIAGNOSIS — R262 Difficulty in walking, not elsewhere classified: Secondary | ICD-10-CM | POA: Diagnosis not present

## 2021-08-10 DIAGNOSIS — R488 Other symbolic dysfunctions: Secondary | ICD-10-CM | POA: Diagnosis not present

## 2021-08-10 DIAGNOSIS — M6281 Muscle weakness (generalized): Secondary | ICD-10-CM | POA: Diagnosis not present

## 2021-08-10 DIAGNOSIS — R296 Repeated falls: Secondary | ICD-10-CM | POA: Diagnosis not present

## 2021-08-10 DIAGNOSIS — M25512 Pain in left shoulder: Secondary | ICD-10-CM | POA: Diagnosis not present

## 2021-08-14 DIAGNOSIS — R262 Difficulty in walking, not elsewhere classified: Secondary | ICD-10-CM | POA: Diagnosis not present

## 2021-08-14 DIAGNOSIS — R296 Repeated falls: Secondary | ICD-10-CM | POA: Diagnosis not present

## 2021-08-14 DIAGNOSIS — M25511 Pain in right shoulder: Secondary | ICD-10-CM | POA: Diagnosis not present

## 2021-08-14 DIAGNOSIS — R488 Other symbolic dysfunctions: Secondary | ICD-10-CM | POA: Diagnosis not present

## 2021-08-14 DIAGNOSIS — M25512 Pain in left shoulder: Secondary | ICD-10-CM | POA: Diagnosis not present

## 2021-08-14 DIAGNOSIS — M6281 Muscle weakness (generalized): Secondary | ICD-10-CM | POA: Diagnosis not present

## 2021-08-15 DIAGNOSIS — M25512 Pain in left shoulder: Secondary | ICD-10-CM | POA: Diagnosis not present

## 2021-08-15 DIAGNOSIS — R296 Repeated falls: Secondary | ICD-10-CM | POA: Diagnosis not present

## 2021-08-15 DIAGNOSIS — M25511 Pain in right shoulder: Secondary | ICD-10-CM | POA: Diagnosis not present

## 2021-08-15 DIAGNOSIS — M6281 Muscle weakness (generalized): Secondary | ICD-10-CM | POA: Diagnosis not present

## 2021-08-15 DIAGNOSIS — R262 Difficulty in walking, not elsewhere classified: Secondary | ICD-10-CM | POA: Diagnosis not present

## 2021-08-15 DIAGNOSIS — R488 Other symbolic dysfunctions: Secondary | ICD-10-CM | POA: Diagnosis not present

## 2021-08-16 DIAGNOSIS — R262 Difficulty in walking, not elsewhere classified: Secondary | ICD-10-CM | POA: Diagnosis not present

## 2021-08-16 DIAGNOSIS — M25512 Pain in left shoulder: Secondary | ICD-10-CM | POA: Diagnosis not present

## 2021-08-16 DIAGNOSIS — R296 Repeated falls: Secondary | ICD-10-CM | POA: Diagnosis not present

## 2021-08-16 DIAGNOSIS — M25511 Pain in right shoulder: Secondary | ICD-10-CM | POA: Diagnosis not present

## 2021-08-16 DIAGNOSIS — M6281 Muscle weakness (generalized): Secondary | ICD-10-CM | POA: Diagnosis not present

## 2021-08-16 DIAGNOSIS — R488 Other symbolic dysfunctions: Secondary | ICD-10-CM | POA: Diagnosis not present

## 2021-08-17 DIAGNOSIS — R488 Other symbolic dysfunctions: Secondary | ICD-10-CM | POA: Diagnosis not present

## 2021-08-17 DIAGNOSIS — R262 Difficulty in walking, not elsewhere classified: Secondary | ICD-10-CM | POA: Diagnosis not present

## 2021-08-17 DIAGNOSIS — R296 Repeated falls: Secondary | ICD-10-CM | POA: Diagnosis not present

## 2021-08-17 DIAGNOSIS — M25512 Pain in left shoulder: Secondary | ICD-10-CM | POA: Diagnosis not present

## 2021-08-17 DIAGNOSIS — M25511 Pain in right shoulder: Secondary | ICD-10-CM | POA: Diagnosis not present

## 2021-08-17 DIAGNOSIS — M6281 Muscle weakness (generalized): Secondary | ICD-10-CM | POA: Diagnosis not present

## 2021-08-21 DIAGNOSIS — R488 Other symbolic dysfunctions: Secondary | ICD-10-CM | POA: Diagnosis not present

## 2021-08-21 DIAGNOSIS — R262 Difficulty in walking, not elsewhere classified: Secondary | ICD-10-CM | POA: Diagnosis not present

## 2021-08-21 DIAGNOSIS — H353211 Exudative age-related macular degeneration, right eye, with active choroidal neovascularization: Secondary | ICD-10-CM | POA: Diagnosis not present

## 2021-08-21 DIAGNOSIS — M25511 Pain in right shoulder: Secondary | ICD-10-CM | POA: Diagnosis not present

## 2021-08-21 DIAGNOSIS — H35453 Secondary pigmentary degeneration, bilateral: Secondary | ICD-10-CM | POA: Diagnosis not present

## 2021-08-21 DIAGNOSIS — M25512 Pain in left shoulder: Secondary | ICD-10-CM | POA: Diagnosis not present

## 2021-08-21 DIAGNOSIS — H353121 Nonexudative age-related macular degeneration, left eye, early dry stage: Secondary | ICD-10-CM | POA: Diagnosis not present

## 2021-08-21 DIAGNOSIS — H35721 Serous detachment of retinal pigment epithelium, right eye: Secondary | ICD-10-CM | POA: Diagnosis not present

## 2021-08-21 DIAGNOSIS — H35363 Drusen (degenerative) of macula, bilateral: Secondary | ICD-10-CM | POA: Diagnosis not present

## 2021-08-21 DIAGNOSIS — M6281 Muscle weakness (generalized): Secondary | ICD-10-CM | POA: Diagnosis not present

## 2021-08-21 DIAGNOSIS — Z961 Presence of intraocular lens: Secondary | ICD-10-CM | POA: Diagnosis not present

## 2021-08-21 DIAGNOSIS — R296 Repeated falls: Secondary | ICD-10-CM | POA: Diagnosis not present

## 2021-08-22 DIAGNOSIS — R296 Repeated falls: Secondary | ICD-10-CM | POA: Diagnosis not present

## 2021-08-22 DIAGNOSIS — R488 Other symbolic dysfunctions: Secondary | ICD-10-CM | POA: Diagnosis not present

## 2021-08-22 DIAGNOSIS — M25511 Pain in right shoulder: Secondary | ICD-10-CM | POA: Diagnosis not present

## 2021-08-22 DIAGNOSIS — R262 Difficulty in walking, not elsewhere classified: Secondary | ICD-10-CM | POA: Diagnosis not present

## 2021-08-22 DIAGNOSIS — M25512 Pain in left shoulder: Secondary | ICD-10-CM | POA: Diagnosis not present

## 2021-08-22 DIAGNOSIS — M6281 Muscle weakness (generalized): Secondary | ICD-10-CM | POA: Diagnosis not present

## 2021-08-23 DIAGNOSIS — M6281 Muscle weakness (generalized): Secondary | ICD-10-CM | POA: Diagnosis not present

## 2021-08-23 DIAGNOSIS — M25512 Pain in left shoulder: Secondary | ICD-10-CM | POA: Diagnosis not present

## 2021-08-23 DIAGNOSIS — R296 Repeated falls: Secondary | ICD-10-CM | POA: Diagnosis not present

## 2021-08-23 DIAGNOSIS — R488 Other symbolic dysfunctions: Secondary | ICD-10-CM | POA: Diagnosis not present

## 2021-08-23 DIAGNOSIS — R262 Difficulty in walking, not elsewhere classified: Secondary | ICD-10-CM | POA: Diagnosis not present

## 2021-08-23 DIAGNOSIS — M25511 Pain in right shoulder: Secondary | ICD-10-CM | POA: Diagnosis not present

## 2021-08-24 DIAGNOSIS — R296 Repeated falls: Secondary | ICD-10-CM | POA: Diagnosis not present

## 2021-08-24 DIAGNOSIS — R262 Difficulty in walking, not elsewhere classified: Secondary | ICD-10-CM | POA: Diagnosis not present

## 2021-08-24 DIAGNOSIS — M6281 Muscle weakness (generalized): Secondary | ICD-10-CM | POA: Diagnosis not present

## 2021-08-24 DIAGNOSIS — M25511 Pain in right shoulder: Secondary | ICD-10-CM | POA: Diagnosis not present

## 2021-08-24 DIAGNOSIS — M25512 Pain in left shoulder: Secondary | ICD-10-CM | POA: Diagnosis not present

## 2021-08-24 DIAGNOSIS — R488 Other symbolic dysfunctions: Secondary | ICD-10-CM | POA: Diagnosis not present

## 2021-08-25 ENCOUNTER — Other Ambulatory Visit: Payer: Self-pay | Admitting: Neurology

## 2021-08-27 DIAGNOSIS — R296 Repeated falls: Secondary | ICD-10-CM | POA: Diagnosis not present

## 2021-08-27 DIAGNOSIS — M6281 Muscle weakness (generalized): Secondary | ICD-10-CM | POA: Diagnosis not present

## 2021-08-27 DIAGNOSIS — R488 Other symbolic dysfunctions: Secondary | ICD-10-CM | POA: Diagnosis not present

## 2021-08-27 DIAGNOSIS — M25512 Pain in left shoulder: Secondary | ICD-10-CM | POA: Diagnosis not present

## 2021-08-27 DIAGNOSIS — R262 Difficulty in walking, not elsewhere classified: Secondary | ICD-10-CM | POA: Diagnosis not present

## 2021-08-27 DIAGNOSIS — M25511 Pain in right shoulder: Secondary | ICD-10-CM | POA: Diagnosis not present

## 2021-08-28 DIAGNOSIS — M25512 Pain in left shoulder: Secondary | ICD-10-CM | POA: Diagnosis not present

## 2021-08-28 DIAGNOSIS — R262 Difficulty in walking, not elsewhere classified: Secondary | ICD-10-CM | POA: Diagnosis not present

## 2021-08-28 DIAGNOSIS — M6281 Muscle weakness (generalized): Secondary | ICD-10-CM | POA: Diagnosis not present

## 2021-08-28 DIAGNOSIS — R488 Other symbolic dysfunctions: Secondary | ICD-10-CM | POA: Diagnosis not present

## 2021-08-28 DIAGNOSIS — M25511 Pain in right shoulder: Secondary | ICD-10-CM | POA: Diagnosis not present

## 2021-08-28 DIAGNOSIS — R296 Repeated falls: Secondary | ICD-10-CM | POA: Diagnosis not present

## 2021-08-30 DIAGNOSIS — M25512 Pain in left shoulder: Secondary | ICD-10-CM | POA: Diagnosis not present

## 2021-08-30 DIAGNOSIS — R296 Repeated falls: Secondary | ICD-10-CM | POA: Diagnosis not present

## 2021-08-30 DIAGNOSIS — R262 Difficulty in walking, not elsewhere classified: Secondary | ICD-10-CM | POA: Diagnosis not present

## 2021-08-30 DIAGNOSIS — M25511 Pain in right shoulder: Secondary | ICD-10-CM | POA: Diagnosis not present

## 2021-08-30 DIAGNOSIS — M6281 Muscle weakness (generalized): Secondary | ICD-10-CM | POA: Diagnosis not present

## 2021-08-30 DIAGNOSIS — R488 Other symbolic dysfunctions: Secondary | ICD-10-CM | POA: Diagnosis not present

## 2021-08-31 ENCOUNTER — Other Ambulatory Visit: Payer: Self-pay

## 2021-08-31 ENCOUNTER — Ambulatory Visit: Payer: PPO | Admitting: Neurology

## 2021-08-31 ENCOUNTER — Encounter: Payer: Self-pay | Admitting: Neurology

## 2021-08-31 VITALS — BP 108/66 | HR 77 | Ht 63.0 in | Wt 188.0 lb

## 2021-08-31 DIAGNOSIS — F0150 Vascular dementia without behavioral disturbance: Secondary | ICD-10-CM

## 2021-08-31 MED ORDER — MEMANTINE HCL 10 MG PO TABS: ORAL_TABLET | ORAL | 3 refills | Status: AC

## 2021-08-31 NOTE — Progress Notes (Signed)
NEUROLOGY FOLLOW UP OFFICE NOTE  Kayla Carrillo [id number] 1936-05-24  HISTORY OF PRESENT ILLNESS: I had the pleasure of seeing Kayla Carrillo in follow-up in the neurology clinic on 09/11/2021.  The patient was last seen 7 months ago for memory loss. She is accompanied by her son who helps supplement the history today.  Records and images were personally reviewed where available. I personally reviewed MRI brain without contrast done 01/2021 which did not show any acute changes. There was extensive chronic microvascular disease. There was note of chronic degenerative arthropathy at the C1-2 articulation. Neuropsychological evaluation in 03/2021 indicated Major Neurocognitive Disorder (ie dementia). Etiology likely mixed vascular and Alzheimer's. Concern for LBD also raised, however she does not have any other clinical features of LBD. She is on Memantine 10mg  BID without side effects (avoiding cholinesterase inhibitors due to cardiac issues). She feels her memory is pretty good, but the past week there were a couple of days she would forget things, sees it in her head but can't find the words. Her son says she is doing okay, "not seeing her getting worse." She is in [facility] at [facility]. Kayla fixes her pillbox and staff comes twice a day to administer her medications. She is not driving. Family manages finances. She can get in the shower but most mornings staff is on standby. She can do almost everything with dressing but needs help pulling pants back up. She has been doing physical therapy due to falls/equilibrium off, and her son feels she is a little bit better mobility-wise.She denies any headaches, dizziness, vision changes. She had some dysuria and states she was prescribed something for her bladder recently. She usually gets 8 hours of sleep. She states she gets "the blues a little bit, sometimes I could use a boost," her son denies any significant personality changes, paranoia, or  hallucinations. She ambulates with a walker, last fall was a month ago (without walker) when she slid on the floor.    History on Initial Assessment 01/30/2021: This is a pleasant 85 year old right-handed woman with a history of hyperlipidemia, CHF,Takotsubo cardiomyopathy, depression, urinary incontinence, presenting for evaluation of memory loss. Her ex-daughter-in-law Kayla is present to provide additional information. When asked about her memory, she states that she cannot remember names, making her mad at herself. She has word-finding difficulties also noted on today's visit. Kayla started noticing changes in 2019 while she was living in [place]. There was a lot of stress at that time, she was in the middle of a divorce at age 68 and had to sell her house. She moved in with her ex-son-in-law in [place] but had stress when her daughter also came to stay. She moved to [facility] 6 months ago, there is less stress, however Kayla continues to note cognitive issues. She has gotten lost driving and called Kayla 3 times in the past 2 years. She got lost driving to her great grandson's birthday 7 months ago. The last time she drove in September 2021 she was on the wrong side of the road. She forgets her medications, Kayla started fixing her pillbox and noticed this morning that she took her night medications in the morning yesterday. Most of her bills are on autopay. She does not cook. She states she gets depressed and that maybe her Zoloft helps. No paranoia or hallucinations.   She has bladder and bowel incontinence, stating she can "pee up a storm." She wears adult diapers. She washes her sheets daily due to  the incontinence, no issues using the washing machine. She has dizziness when standing. She has had a lot of falls, she She had a fall in 09/2020 and admitted for left elbow and right humerus fracture s/p ORIF in 09/2020. At that time she had a head CT without contrast which I  personally reviewed, no acute changes, there was mild diffuse atrophy and moderate chronic microvascular disease. She has occasional double vision. Her neck is stiff sometimes. She has numbness in her right hand and soles of both feet. She states she had lost her sense of smell after taking cough medication years ago. No headaches, dysarthria/dysphagia, back pain, tremors. No family history of dementia. She has had falls where she hit her head, no loss of consciousness. She rarely drinks alcohol.   Laboratory Data: 07/2020: TSH 1.83, B12 330  PAST MEDICAL HISTORY: Past Medical History:  Diagnosis Date   Abnormal EKG 02/16/2019   Acute lower GI bleeding 04/06/2018   Cataract, left eye 11/02/2013   Chronic systolic heart failure    Closed displaced transcondylar fracture of right humerus 10/21/2020   sustained in a mechanical fall; ORIF surgical repair 10/23/2020   Closed olecranon fracture, left 10/21/2020   mechanical fall; ORIF 10/23/2020   Cystoid macular edema of right eye 04/25/2020   OD with minor CME nasal to the foveal avascular zone this is a surrogate for active CNVM OD   Diverticulosis of colon with hemorrhage 04/06/2018   Essential hypertension 10/21/2020   Exudative age-related macular degeneration of right eye with active choroidal neovascularization 04/25/2020   Gastroesophageal reflux disease    Hyperlipidemia 10/10/2010   Intermediate stage nonexudative age-related macular degeneration of left eye 04/25/2020   Leukocytosis 04/06/2018   White blood count varied from 11,600 up to 12,600 without left shift.  No signs of infection present.  Leukocytosis is attributed to stress of the fall, fractures and surgery.   Localized osteoarthritis of lower leg 04/16/2011   right knee   LVH (left ventricular hypertrophy) 12/10/2020   Major depressive disorder    Obstructive sleep apnea 04/09/2011   Not using CPAP   Osteoarthritis of shoulder region 11/12/2010   Pure  hypercholesterolemia    Stroke    02/17/2021 brain MRI - A few old small vessel cerebellar infarctions were noted, as well as small vessel infarctions in the thalami and basal ganglia.    Takotsubo cardiomyopathy 06/17/2013   Urinary incontinence    Vascular dementia 04/10/2021    MEDICATIONS: Current Outpatient Medications on File Prior to Visit  Medication Sig Dispense Refill   carvedilol (COREG) 6.25 MG tablet Take 1 tablet (6.25 mg total) by mouth 2 (two) times daily with a meal. 60 tablet 0   cholecalciferol (VITAMIN D) 25 MCG tablet Take 2 tablets (2,000 Units total) by mouth daily.     hydrALAZINE (APRESOLINE) 25 MG tablet Take 1 tablet (25 mg total) by mouth in the morning and at bedtime. 180 tablet 3   nitrofurantoin (MACRODANTIN) 100 MG capsule Take 1 capsule (100 mg total) by mouth at bedtime. 30 capsule 0   omeprazole (PRILOSEC) 20 MG capsule Take 1 capsule (20 mg total) by mouth daily. 30 capsule 0   oxybutynin (DITROPAN-XL) 10 MG 24 hr tablet Take 1 tablet (10 mg total) by mouth daily. 30 tablet 0   sertraline (ZOLOFT) 50 MG tablet Take 1.5 tablets (75 mg total) by mouth daily. Give 1.5 tablets to = 75 mg 45 tablet 0   simvastatin (ZOCOR) 40 MG tablet Take  1 tablet (40 mg total) by mouth daily. 30 tablet 0   tolterodine (DETROL LA) 4 MG 24 hr capsule Take 4 mg by mouth daily.     Ensure (ENSURE) Take 237 mLs by mouth 2 (two) times daily between meals. To prevent malnutrition (Patient not taking: Reported on 08/31/2021)     No current facility-administered medications on file prior to visit.    ALLERGIES: Allergies  Allergen Reactions   Codeine Sulfate [Codeine] Other (See Comments)    drowsey   Sulfa Antibiotics Other (See Comments)    drowsey    FAMILY HISTORY: Family History  Problem Relation Age of Onset   Bipolar disorder Daughter     SOCIAL HISTORY: Social History   Socioeconomic History   Marital status: Single    Spouse name: Not on file   Number of  children: Not on file   Years of education: 12   Highest education level: High school graduate  Occupational History   Occupation: Retired  Tobacco Use   Smoking status: Never   Smokeless tobacco: Never  Vaping Use   Vaping Use: Never used  Substance and Sexual Activity   Alcohol use: Not Currently    Comment: rare   Drug use: Never   Sexual activity: Not on file  Other Topics Concern   Not on file  Social History Narrative   Four children. Husband lives with [place] with his daughters.     Right handed    Lives alone at home   Social Determinants of Health   Financial Resource Strain: Not on file  Food Insecurity: Not on file  Transportation Needs: Not on file  Physical Activity: Not on file  Stress: Not on file  Social Connections: Not on file  Intimate Partner Violence: Not on file     PHYSICAL EXAM: Vitals:   08/31/21 1123  BP: 108/66  Pulse: 77  SpO2: 96%   General: No acute distress Head:  Normocephalic/atraumatic Skin/Extremities: No rash, no edema Neurological Exam: alert and oriented to person, place, and time. No aphasia or dysarthria. Fund of knowledge is appropriate.  Recent and remote memory are intact.  Attention and concentration are normal.   Cranial nerves: Pupils equal, round. Extraocular movements intact with no nystagmus. Visual fields full.  No facial asymmetry.  Motor: Bulk and tone normal, muscle strength 5/5 throughout with no pronator drift.   Finger to nose testing intact.  Gait narrow-based and steady, able to tandem walk adequately.  Romberg negative.   IMPRESSION: This is a pleasant 85 year old right-handed woman with a history of hyperlipidemia, CHF,Takotsubo cardiomyopathy, depression, urinary incontinence, with mild dementia, likely mixed vascular and AD. No clinical signs of LBD. MRI brain showed extensive chronic microvascular disease. There was note of chronic degenerative arthropathy at the C1-2 articulation. Neuropsychological evaluation  in 03/2021 indicated Major Neurocognitive Disorder (ie dementia). Etiology likely mixed vascular and Alzheimer's. Concern for LBD also raised, however she does not have any other clinical features of LBD. She is on Memantine 10mg  BID, symptoms overall stable, no behavioral changes. She was encouraged to join activities at her facility, continue regular exercise. Continue control of vascular risk factors. Continue close supervision. She does not drive. Follow-up in 6 months, they know to call for any changes.   Thank you for allowing me to participate in her care.  Please do not hesitate to call for any questions or concerns.    , M.D.   CC: Dr. Patrcia Dolly

## 2021-08-31 NOTE — Patient Instructions (Signed)
Continue Memantine 10mg  twice a day  2. Continue close supervision  3. Follow-up in 6 months, call for any changes   FALL PRECAUTIONS: Be cautious when walking. Scan the area for obstacles that may increase the risk of trips and falls. When getting up in the mornings, sit up at the edge of the bed for a few minutes before getting out of bed. Consider elevating the bed at the head end to avoid drop of blood pressure when getting up. Walk always in a well-lit room (use night lights in the walls). Avoid area rugs or power cords from appliances in the middle of the walkways. Use a walker or a cane if necessary and consider physical therapy for balance exercise. Get your eyesight checked regularly.  FINANCIAL OVERSIGHT: Supervision, especially oversight when making financial decisions or transactions is also recommended.  HOME SAFETY: Consider the safety of the kitchen when operating appliances like stoves, microwave oven, and blender. Consider having supervision and share cooking responsibilities until no longer able to participate in those. Accidents with firearms and other hazards in the house should be identified and addressed as well.  DRIVING: Regarding driving, in patients with progressive memory problems, driving will be impaired. We advise to have someone else do the driving if trouble finding directions or if minor accidents are reported. Independent driving assessment is available to determine safety of driving.  ABILITY TO BE LEFT ALONE: If patient is unable to contact 911 operator, consider using LifeLine, or when the need is there, arrange for someone to stay with patients. Smoking is a fire hazard, consider supervision or cessation. Risk of wandering should be assessed by caregiver and if detected at any point, supervision and safe proof recommendations should be instituted.  MEDICATION SUPERVISION: Inability to self-administer medication needs to be constantly addressed. Implement a  mechanism to ensure safe administration of the medications.  RECOMMENDATIONS FOR ALL PATIENTS WITH MEMORY PROBLEMS: 1. Continue to exercise (Recommend 30 minutes of walking everyday, or 3 hours every week) 2. Increase social interactions - continue going to [place] and enjoy social gatherings with friends and family 3. Eat healthy, avoid fried foods and eat more fruits and vegetables 4. Maintain adequate blood pressure, blood sugar, and blood cholesterol level. Reducing the risk of stroke and cardiovascular disease also helps promoting better memory. 5. Avoid stressful situations. Live a simple life and avoid aggravations. Organize your time and prepare for the next day in anticipation. 6. Sleep well, avoid any interruptions of sleep and avoid any distractions in the bedroom that may interfere with adequate sleep quality 7. Avoid sugar, avoid sweets as there is a strong link between excessive sugar intake, diabetes, and cognitive impairment We discussed the Mediterranean diet, which has been shown to help patients reduce the risk of progressive memory disorders and reduces cardiovascular risk. This includes eating fish, eat fruits and green leafy vegetables, nuts like almonds and hazelnuts, walnuts, and also use olive oil. Avoid fast foods and fried foods as much as possible. Avoid sweets and sugar as sugar use has been linked to worsening of memory function.  There is always a concern of gradual progression of memory problems. If this is the case, then we may need to adjust level of care according to patient needs. Support, both to the patient and caregiver, should then be put into place.      Mediterranean Diet  Why follow it? Research shows. Those who follow the Mediterranean diet have a reduced risk of heart disease  The diet  is associated with a reduced incidence of Parkinson's and Alzheimer's diseases People following the diet may have longer life expectancies and lower rates of chronic  diseases  The Dietary Guidelines for Americans recommends the Mediterranean diet as an eating plan to promote health and prevent disease  What Is the Mediterranean Diet?  Healthy eating plan based on typical foods and recipes of Mediterranean-style cooking The diet is primarily a plant based diet; these foods should make up a majority of meals   Starches - Plant based foods should make up a majority of meals - They are an important sources of vitamins, minerals, energy, antioxidants, and fiber - Choose whole grains, foods high in fiber and minimally processed items  - Typical grain sources include wheat, oats, barley, corn, brown rice, bulgar, farro, millet, polenta, couscous  - Various types of beans include chickpeas, lentils, fava beans, black beans, white beans   Fruits  Veggies - Large quantities of antioxidant rich fruits & veggies; 6 or more servings  - Vegetables can be eaten raw or lightly drizzled with oil and cooked  - Vegetables common to the traditional Mediterranean Diet include: artichokes, arugula, beets, broccoli, brussel sprouts, cabbage, carrots, celery, collard greens, cucumbers, eggplant, kale, leeks, lemons, lettuce, mushrooms, okra, onions, peas, peppers, potatoes, pumpkin, radishes, rutabaga, shallots, spinach, sweet potatoes, turnips, zucchini - Fruits common to the Mediterranean Diet include: apples, apricots, avocados, cherries, clementines, dates, figs, grapefruits, grapes, melons, nectarines, oranges, peaches, pears, pomegranates, strawberries, tangerines  Fats - Replace butter and margarine with healthy oils, such as olive oil, canola oil, and tahini  - Limit nuts to no more than a handful a day  - Nuts include walnuts, almonds, pecans, pistachios, pine nuts  - Limit or avoid candied, honey roasted or heavily salted nuts - Olives are central to the [employer] - can be eaten whole or used in a variety of dishes   Meats Protein - Limiting red meat: no more  than a few times a month - When eating red meat: choose lean cuts and keep the portion to the size of deck of cards - Eggs: approx. 0 to 4 times a week  - Fish and lean poultry: at least 2 a week  - Healthy protein sources include, chicken, [place], lean beef, lamb - Increase intake of seafood such as tuna, salmon, trout, mackerel, shrimp, scallops - Avoid or limit high fat processed meats such as sausage and bacon  Dairy - Include moderate amounts of low fat dairy products  - Focus on healthy dairy such as fat free yogurt, skim milk, low or reduced fat cheese - Limit dairy products higher in fat such as whole or 2% milk, cheese, ice cream  Alcohol - Moderate amounts of red wine is ok  - No more than 5 oz daily for women (all ages) and men older than age 50  - No more than 10 oz of wine daily for men younger than 28  Other - Limit sweets and other desserts  - Use herbs and spices instead of salt to flavor foods  - Herbs and spices common to the traditional Mediterranean Diet include: basil, bay leaves, chives, cloves, cumin, fennel, garlic, lavender, marjoram, mint, oregano, parsley, pepper, rosemary, sage, savory, sumac, tarragon, thyme   It's not just a diet, it's a lifestyle:  The Mediterranean diet includes lifestyle factors typical of those in the region  Foods, drinks and meals are best eaten with others and savored Daily physical activity is important for overall  good health This could be strenuous exercise like running and aerobics This could also be more leisurely activities such as walking, housework, yard-work, or taking the stairs Moderation is the key; a balanced and healthy diet accommodates most foods and drinks Consider portion sizes and frequency of consumption of certain foods   Meal Ideas & Options:  Breakfast:  Whole wheat toast or whole wheat English muffins with peanut butter & hard boiled egg Steel cut oats topped with apples & cinnamon and skim milk  Fresh fruit:  banana, strawberries, melon, berries, peaches  Smoothies: strawberries, bananas, greek yogurt, peanut butter Low fat greek yogurt with blueberries and granola  Egg white omelet with spinach and mushrooms Breakfast couscous: whole wheat couscous, apricots, skim milk, cranberries  Sandwiches:  Hummus and grilled vegetables (peppers, zucchini, squash) on whole wheat bread   Grilled chicken on whole wheat pita with lettuce, tomatoes, cucumbers or tzatziki  [place] salad on whole wheat bread: tuna salad made with greek yogurt, olives, red peppers, capers, green onions Garlic rosemary lamb pita: lamb sauted with garlic, rosemary, salt & pepper; add lettuce, cucumber, greek yogurt to pita - flavor with lemon juice and black pepper  Seafood:  Mediterranean grilled salmon, seasoned with garlic, basil, parsley, lemon juice and black pepper Shrimp, lemon, and spinach whole-grain pasta salad made with low fat greek yogurt  Seared scallops with lemon orzo  Seared tuna steaks seasoned salt, pepper, coriander topped with tomato mixture of olives, tomatoes, olive oil, minced garlic, parsley, green onions and cappers  Meats:  Herbed greek chicken salad with kalamata olives, cucumber, feta  Red bell peppers stuffed with spinach, bulgur, lean ground beef (or lentils) & topped with feta   Kebabs: skewers of chicken, tomatoes, onions, zucchini, squash  [place] burgers: made with red onions, mint, dill, lemon juice, feta cheese topped with roasted red peppers Vegetarian Cucumber salad: cucumbers, artichoke hearts, celery, red onion, feta cheese, tossed in olive oil & lemon juice  Hummus and whole grain pita points with a greek salad (lettuce, tomato, feta, olives, cucumbers, red onion) Lentil soup with celery, carrots made with vegetable broth, garlic, salt and pepper  Tabouli salad: parsley, bulgur, mint, scallions, cucumbers, tomato, radishes, lemon juice, olive oil, salt and pepper.

## 2021-09-04 DIAGNOSIS — R296 Repeated falls: Secondary | ICD-10-CM | POA: Diagnosis not present

## 2021-09-04 DIAGNOSIS — R488 Other symbolic dysfunctions: Secondary | ICD-10-CM | POA: Diagnosis not present

## 2021-09-04 DIAGNOSIS — M25512 Pain in left shoulder: Secondary | ICD-10-CM | POA: Diagnosis not present

## 2021-09-04 DIAGNOSIS — M6281 Muscle weakness (generalized): Secondary | ICD-10-CM | POA: Diagnosis not present

## 2021-09-04 DIAGNOSIS — R262 Difficulty in walking, not elsewhere classified: Secondary | ICD-10-CM | POA: Diagnosis not present

## 2021-09-04 DIAGNOSIS — M25511 Pain in right shoulder: Secondary | ICD-10-CM | POA: Diagnosis not present

## 2021-09-05 DIAGNOSIS — R262 Difficulty in walking, not elsewhere classified: Secondary | ICD-10-CM | POA: Diagnosis not present

## 2021-09-05 DIAGNOSIS — M6281 Muscle weakness (generalized): Secondary | ICD-10-CM | POA: Diagnosis not present

## 2021-09-05 DIAGNOSIS — M25511 Pain in right shoulder: Secondary | ICD-10-CM | POA: Diagnosis not present

## 2021-09-05 DIAGNOSIS — R296 Repeated falls: Secondary | ICD-10-CM | POA: Diagnosis not present

## 2021-09-05 DIAGNOSIS — M25512 Pain in left shoulder: Secondary | ICD-10-CM | POA: Diagnosis not present

## 2021-09-05 DIAGNOSIS — R488 Other symbolic dysfunctions: Secondary | ICD-10-CM | POA: Diagnosis not present

## 2021-09-06 DIAGNOSIS — M6281 Muscle weakness (generalized): Secondary | ICD-10-CM | POA: Diagnosis not present

## 2021-09-06 DIAGNOSIS — M25512 Pain in left shoulder: Secondary | ICD-10-CM | POA: Diagnosis not present

## 2021-09-06 DIAGNOSIS — R262 Difficulty in walking, not elsewhere classified: Secondary | ICD-10-CM | POA: Diagnosis not present

## 2021-09-06 DIAGNOSIS — M25511 Pain in right shoulder: Secondary | ICD-10-CM | POA: Diagnosis not present

## 2021-09-06 DIAGNOSIS — R488 Other symbolic dysfunctions: Secondary | ICD-10-CM | POA: Diagnosis not present

## 2021-09-06 DIAGNOSIS — R296 Repeated falls: Secondary | ICD-10-CM | POA: Diagnosis not present

## 2021-09-10 DIAGNOSIS — M25511 Pain in right shoulder: Secondary | ICD-10-CM | POA: Diagnosis not present

## 2021-09-10 DIAGNOSIS — M25512 Pain in left shoulder: Secondary | ICD-10-CM | POA: Diagnosis not present

## 2021-09-10 DIAGNOSIS — R488 Other symbolic dysfunctions: Secondary | ICD-10-CM | POA: Diagnosis not present

## 2021-09-10 DIAGNOSIS — R262 Difficulty in walking, not elsewhere classified: Secondary | ICD-10-CM | POA: Diagnosis not present

## 2021-09-10 DIAGNOSIS — R296 Repeated falls: Secondary | ICD-10-CM | POA: Diagnosis not present

## 2021-09-10 DIAGNOSIS — M6281 Muscle weakness (generalized): Secondary | ICD-10-CM | POA: Diagnosis not present

## 2021-09-11 DIAGNOSIS — M25511 Pain in right shoulder: Secondary | ICD-10-CM | POA: Diagnosis not present

## 2021-09-11 DIAGNOSIS — N3942 Incontinence without sensory awareness: Secondary | ICD-10-CM | POA: Diagnosis not present

## 2021-09-11 DIAGNOSIS — R488 Other symbolic dysfunctions: Secondary | ICD-10-CM | POA: Diagnosis not present

## 2021-09-11 DIAGNOSIS — N3944 Nocturnal enuresis: Secondary | ICD-10-CM | POA: Diagnosis not present

## 2021-09-11 DIAGNOSIS — R262 Difficulty in walking, not elsewhere classified: Secondary | ICD-10-CM | POA: Diagnosis not present

## 2021-09-11 DIAGNOSIS — M6281 Muscle weakness (generalized): Secondary | ICD-10-CM | POA: Diagnosis not present

## 2021-09-11 DIAGNOSIS — M25512 Pain in left shoulder: Secondary | ICD-10-CM | POA: Diagnosis not present

## 2021-09-11 DIAGNOSIS — R296 Repeated falls: Secondary | ICD-10-CM | POA: Diagnosis not present

## 2021-09-12 DIAGNOSIS — M6281 Muscle weakness (generalized): Secondary | ICD-10-CM | POA: Diagnosis not present

## 2021-09-12 DIAGNOSIS — M25512 Pain in left shoulder: Secondary | ICD-10-CM | POA: Diagnosis not present

## 2021-09-12 DIAGNOSIS — M25511 Pain in right shoulder: Secondary | ICD-10-CM | POA: Diagnosis not present

## 2021-09-12 DIAGNOSIS — H353211 Exudative age-related macular degeneration, right eye, with active choroidal neovascularization: Secondary | ICD-10-CM | POA: Diagnosis not present

## 2021-09-12 DIAGNOSIS — R262 Difficulty in walking, not elsewhere classified: Secondary | ICD-10-CM | POA: Diagnosis not present

## 2021-09-12 DIAGNOSIS — R296 Repeated falls: Secondary | ICD-10-CM | POA: Diagnosis not present

## 2021-09-12 DIAGNOSIS — R488 Other symbolic dysfunctions: Secondary | ICD-10-CM | POA: Diagnosis not present

## 2021-09-14 DIAGNOSIS — R488 Other symbolic dysfunctions: Secondary | ICD-10-CM | POA: Diagnosis not present

## 2021-09-14 DIAGNOSIS — M25512 Pain in left shoulder: Secondary | ICD-10-CM | POA: Diagnosis not present

## 2021-09-14 DIAGNOSIS — R296 Repeated falls: Secondary | ICD-10-CM | POA: Diagnosis not present

## 2021-09-14 DIAGNOSIS — M6281 Muscle weakness (generalized): Secondary | ICD-10-CM | POA: Diagnosis not present

## 2021-09-14 DIAGNOSIS — M25511 Pain in right shoulder: Secondary | ICD-10-CM | POA: Diagnosis not present

## 2021-09-14 DIAGNOSIS — R262 Difficulty in walking, not elsewhere classified: Secondary | ICD-10-CM | POA: Diagnosis not present

## 2021-09-17 DIAGNOSIS — M25511 Pain in right shoulder: Secondary | ICD-10-CM | POA: Diagnosis not present

## 2021-09-17 DIAGNOSIS — M25512 Pain in left shoulder: Secondary | ICD-10-CM | POA: Diagnosis not present

## 2021-09-17 DIAGNOSIS — R296 Repeated falls: Secondary | ICD-10-CM | POA: Diagnosis not present

## 2021-09-17 DIAGNOSIS — R262 Difficulty in walking, not elsewhere classified: Secondary | ICD-10-CM | POA: Diagnosis not present

## 2021-09-17 DIAGNOSIS — M6281 Muscle weakness (generalized): Secondary | ICD-10-CM | POA: Diagnosis not present

## 2021-09-17 DIAGNOSIS — R488 Other symbolic dysfunctions: Secondary | ICD-10-CM | POA: Diagnosis not present

## 2021-09-18 DIAGNOSIS — M6281 Muscle weakness (generalized): Secondary | ICD-10-CM | POA: Diagnosis not present

## 2021-09-18 DIAGNOSIS — R488 Other symbolic dysfunctions: Secondary | ICD-10-CM | POA: Diagnosis not present

## 2021-09-18 DIAGNOSIS — R262 Difficulty in walking, not elsewhere classified: Secondary | ICD-10-CM | POA: Diagnosis not present

## 2021-09-18 DIAGNOSIS — M25511 Pain in right shoulder: Secondary | ICD-10-CM | POA: Diagnosis not present

## 2021-09-18 DIAGNOSIS — R296 Repeated falls: Secondary | ICD-10-CM | POA: Diagnosis not present

## 2021-09-18 DIAGNOSIS — M25512 Pain in left shoulder: Secondary | ICD-10-CM | POA: Diagnosis not present

## 2021-09-19 DIAGNOSIS — R488 Other symbolic dysfunctions: Secondary | ICD-10-CM | POA: Diagnosis not present

## 2021-09-19 DIAGNOSIS — M25512 Pain in left shoulder: Secondary | ICD-10-CM | POA: Diagnosis not present

## 2021-09-19 DIAGNOSIS — R262 Difficulty in walking, not elsewhere classified: Secondary | ICD-10-CM | POA: Diagnosis not present

## 2021-09-19 DIAGNOSIS — M6281 Muscle weakness (generalized): Secondary | ICD-10-CM | POA: Diagnosis not present

## 2021-09-19 DIAGNOSIS — R296 Repeated falls: Secondary | ICD-10-CM | POA: Diagnosis not present

## 2021-09-19 DIAGNOSIS — M25511 Pain in right shoulder: Secondary | ICD-10-CM | POA: Diagnosis not present

## 2021-09-21 DIAGNOSIS — R488 Other symbolic dysfunctions: Secondary | ICD-10-CM | POA: Diagnosis not present

## 2021-09-21 DIAGNOSIS — M25511 Pain in right shoulder: Secondary | ICD-10-CM | POA: Diagnosis not present

## 2021-09-21 DIAGNOSIS — M25512 Pain in left shoulder: Secondary | ICD-10-CM | POA: Diagnosis not present

## 2021-09-21 DIAGNOSIS — R296 Repeated falls: Secondary | ICD-10-CM | POA: Diagnosis not present

## 2021-09-21 DIAGNOSIS — R262 Difficulty in walking, not elsewhere classified: Secondary | ICD-10-CM | POA: Diagnosis not present

## 2021-09-21 DIAGNOSIS — M6281 Muscle weakness (generalized): Secondary | ICD-10-CM | POA: Diagnosis not present

## 2021-09-26 DIAGNOSIS — M25511 Pain in right shoulder: Secondary | ICD-10-CM | POA: Diagnosis not present

## 2021-09-26 DIAGNOSIS — M6281 Muscle weakness (generalized): Secondary | ICD-10-CM | POA: Diagnosis not present

## 2021-09-26 DIAGNOSIS — R488 Other symbolic dysfunctions: Secondary | ICD-10-CM | POA: Diagnosis not present

## 2021-09-26 DIAGNOSIS — M25512 Pain in left shoulder: Secondary | ICD-10-CM | POA: Diagnosis not present

## 2021-09-26 DIAGNOSIS — R296 Repeated falls: Secondary | ICD-10-CM | POA: Diagnosis not present

## 2021-09-26 DIAGNOSIS — R262 Difficulty in walking, not elsewhere classified: Secondary | ICD-10-CM | POA: Diagnosis not present

## 2021-09-27 DIAGNOSIS — R296 Repeated falls: Secondary | ICD-10-CM | POA: Diagnosis not present

## 2021-09-27 DIAGNOSIS — M6281 Muscle weakness (generalized): Secondary | ICD-10-CM | POA: Diagnosis not present

## 2021-09-27 DIAGNOSIS — M25511 Pain in right shoulder: Secondary | ICD-10-CM | POA: Diagnosis not present

## 2021-09-27 DIAGNOSIS — M25512 Pain in left shoulder: Secondary | ICD-10-CM | POA: Diagnosis not present

## 2021-09-27 DIAGNOSIS — R488 Other symbolic dysfunctions: Secondary | ICD-10-CM | POA: Diagnosis not present

## 2021-09-27 DIAGNOSIS — R262 Difficulty in walking, not elsewhere classified: Secondary | ICD-10-CM | POA: Diagnosis not present

## 2021-10-02 DIAGNOSIS — R488 Other symbolic dysfunctions: Secondary | ICD-10-CM | POA: Diagnosis not present

## 2021-10-02 DIAGNOSIS — M25512 Pain in left shoulder: Secondary | ICD-10-CM | POA: Diagnosis not present

## 2021-10-02 DIAGNOSIS — R262 Difficulty in walking, not elsewhere classified: Secondary | ICD-10-CM | POA: Diagnosis not present

## 2021-10-02 DIAGNOSIS — M25511 Pain in right shoulder: Secondary | ICD-10-CM | POA: Diagnosis not present

## 2021-10-02 DIAGNOSIS — M6281 Muscle weakness (generalized): Secondary | ICD-10-CM | POA: Diagnosis not present

## 2021-10-02 DIAGNOSIS — R296 Repeated falls: Secondary | ICD-10-CM | POA: Diagnosis not present

## 2021-10-03 DIAGNOSIS — R262 Difficulty in walking, not elsewhere classified: Secondary | ICD-10-CM | POA: Diagnosis not present

## 2021-10-03 DIAGNOSIS — M6281 Muscle weakness (generalized): Secondary | ICD-10-CM | POA: Diagnosis not present

## 2021-10-03 DIAGNOSIS — R488 Other symbolic dysfunctions: Secondary | ICD-10-CM | POA: Diagnosis not present

## 2021-10-03 DIAGNOSIS — M25511 Pain in right shoulder: Secondary | ICD-10-CM | POA: Diagnosis not present

## 2021-10-03 DIAGNOSIS — R296 Repeated falls: Secondary | ICD-10-CM | POA: Diagnosis not present

## 2021-10-03 DIAGNOSIS — M25512 Pain in left shoulder: Secondary | ICD-10-CM | POA: Diagnosis not present

## 2021-10-04 DIAGNOSIS — R488 Other symbolic dysfunctions: Secondary | ICD-10-CM | POA: Diagnosis not present

## 2021-10-04 DIAGNOSIS — R262 Difficulty in walking, not elsewhere classified: Secondary | ICD-10-CM | POA: Diagnosis not present

## 2021-10-04 DIAGNOSIS — R296 Repeated falls: Secondary | ICD-10-CM | POA: Diagnosis not present

## 2021-10-04 DIAGNOSIS — M25512 Pain in left shoulder: Secondary | ICD-10-CM | POA: Diagnosis not present

## 2021-10-04 DIAGNOSIS — M25511 Pain in right shoulder: Secondary | ICD-10-CM | POA: Diagnosis not present

## 2021-10-04 DIAGNOSIS — M6281 Muscle weakness (generalized): Secondary | ICD-10-CM | POA: Diagnosis not present

## 2021-10-08 DIAGNOSIS — R296 Repeated falls: Secondary | ICD-10-CM | POA: Diagnosis not present

## 2021-10-08 DIAGNOSIS — M6281 Muscle weakness (generalized): Secondary | ICD-10-CM | POA: Diagnosis not present

## 2021-10-08 DIAGNOSIS — R488 Other symbolic dysfunctions: Secondary | ICD-10-CM | POA: Diagnosis not present

## 2021-10-08 DIAGNOSIS — M25511 Pain in right shoulder: Secondary | ICD-10-CM | POA: Diagnosis not present

## 2021-10-08 DIAGNOSIS — R262 Difficulty in walking, not elsewhere classified: Secondary | ICD-10-CM | POA: Diagnosis not present

## 2021-10-08 DIAGNOSIS — M25512 Pain in left shoulder: Secondary | ICD-10-CM | POA: Diagnosis not present

## 2021-10-09 DIAGNOSIS — R488 Other symbolic dysfunctions: Secondary | ICD-10-CM | POA: Diagnosis not present

## 2021-10-09 DIAGNOSIS — M6281 Muscle weakness (generalized): Secondary | ICD-10-CM | POA: Diagnosis not present

## 2021-10-09 DIAGNOSIS — R262 Difficulty in walking, not elsewhere classified: Secondary | ICD-10-CM | POA: Diagnosis not present

## 2021-10-09 DIAGNOSIS — M25511 Pain in right shoulder: Secondary | ICD-10-CM | POA: Diagnosis not present

## 2021-10-09 DIAGNOSIS — M25512 Pain in left shoulder: Secondary | ICD-10-CM | POA: Diagnosis not present

## 2021-10-09 DIAGNOSIS — R296 Repeated falls: Secondary | ICD-10-CM | POA: Diagnosis not present

## 2021-10-11 DIAGNOSIS — R262 Difficulty in walking, not elsewhere classified: Secondary | ICD-10-CM | POA: Diagnosis not present

## 2021-10-11 DIAGNOSIS — R296 Repeated falls: Secondary | ICD-10-CM | POA: Diagnosis not present

## 2021-10-11 DIAGNOSIS — R488 Other symbolic dysfunctions: Secondary | ICD-10-CM | POA: Diagnosis not present

## 2021-10-11 DIAGNOSIS — M25512 Pain in left shoulder: Secondary | ICD-10-CM | POA: Diagnosis not present

## 2021-10-11 DIAGNOSIS — M25511 Pain in right shoulder: Secondary | ICD-10-CM | POA: Diagnosis not present

## 2021-10-11 DIAGNOSIS — M6281 Muscle weakness (generalized): Secondary | ICD-10-CM | POA: Diagnosis not present

## 2021-10-15 DIAGNOSIS — R488 Other symbolic dysfunctions: Secondary | ICD-10-CM | POA: Diagnosis not present

## 2021-10-15 DIAGNOSIS — R262 Difficulty in walking, not elsewhere classified: Secondary | ICD-10-CM | POA: Diagnosis not present

## 2021-10-15 DIAGNOSIS — M6281 Muscle weakness (generalized): Secondary | ICD-10-CM | POA: Diagnosis not present

## 2021-10-15 DIAGNOSIS — M25512 Pain in left shoulder: Secondary | ICD-10-CM | POA: Diagnosis not present

## 2021-10-15 DIAGNOSIS — R296 Repeated falls: Secondary | ICD-10-CM | POA: Diagnosis not present

## 2021-10-15 DIAGNOSIS — M25511 Pain in right shoulder: Secondary | ICD-10-CM | POA: Diagnosis not present

## 2021-10-16 DIAGNOSIS — M25512 Pain in left shoulder: Secondary | ICD-10-CM | POA: Diagnosis not present

## 2021-10-16 DIAGNOSIS — R262 Difficulty in walking, not elsewhere classified: Secondary | ICD-10-CM | POA: Diagnosis not present

## 2021-10-16 DIAGNOSIS — M25511 Pain in right shoulder: Secondary | ICD-10-CM | POA: Diagnosis not present

## 2021-10-16 DIAGNOSIS — R296 Repeated falls: Secondary | ICD-10-CM | POA: Diagnosis not present

## 2021-10-16 DIAGNOSIS — R488 Other symbolic dysfunctions: Secondary | ICD-10-CM | POA: Diagnosis not present

## 2021-10-16 DIAGNOSIS — M6281 Muscle weakness (generalized): Secondary | ICD-10-CM | POA: Diagnosis not present

## 2021-10-19 DIAGNOSIS — R296 Repeated falls: Secondary | ICD-10-CM | POA: Diagnosis not present

## 2021-10-19 DIAGNOSIS — M6281 Muscle weakness (generalized): Secondary | ICD-10-CM | POA: Diagnosis not present

## 2021-10-19 DIAGNOSIS — R262 Difficulty in walking, not elsewhere classified: Secondary | ICD-10-CM | POA: Diagnosis not present

## 2021-10-19 DIAGNOSIS — R488 Other symbolic dysfunctions: Secondary | ICD-10-CM | POA: Diagnosis not present

## 2021-10-19 DIAGNOSIS — M25511 Pain in right shoulder: Secondary | ICD-10-CM | POA: Diagnosis not present

## 2021-10-19 DIAGNOSIS — M25512 Pain in left shoulder: Secondary | ICD-10-CM | POA: Diagnosis not present

## 2021-10-22 DIAGNOSIS — R488 Other symbolic dysfunctions: Secondary | ICD-10-CM | POA: Diagnosis not present

## 2021-10-22 DIAGNOSIS — M25512 Pain in left shoulder: Secondary | ICD-10-CM | POA: Diagnosis not present

## 2021-10-22 DIAGNOSIS — R296 Repeated falls: Secondary | ICD-10-CM | POA: Diagnosis not present

## 2021-10-22 DIAGNOSIS — R262 Difficulty in walking, not elsewhere classified: Secondary | ICD-10-CM | POA: Diagnosis not present

## 2021-10-22 DIAGNOSIS — M6281 Muscle weakness (generalized): Secondary | ICD-10-CM | POA: Diagnosis not present

## 2021-10-22 DIAGNOSIS — M25511 Pain in right shoulder: Secondary | ICD-10-CM | POA: Diagnosis not present

## 2021-10-24 DIAGNOSIS — H11431 Conjunctival hyperemia, right eye: Secondary | ICD-10-CM | POA: Diagnosis not present

## 2021-10-24 DIAGNOSIS — H0012 Chalazion right lower eyelid: Secondary | ICD-10-CM | POA: Diagnosis not present

## 2021-10-24 DIAGNOSIS — M6281 Muscle weakness (generalized): Secondary | ICD-10-CM | POA: Diagnosis not present

## 2021-10-24 DIAGNOSIS — H01002 Unspecified blepharitis right lower eyelid: Secondary | ICD-10-CM | POA: Diagnosis not present

## 2021-10-24 DIAGNOSIS — R262 Difficulty in walking, not elsewhere classified: Secondary | ICD-10-CM | POA: Diagnosis not present

## 2021-10-24 DIAGNOSIS — M25512 Pain in left shoulder: Secondary | ICD-10-CM | POA: Diagnosis not present

## 2021-10-24 DIAGNOSIS — R296 Repeated falls: Secondary | ICD-10-CM | POA: Diagnosis not present

## 2021-10-24 DIAGNOSIS — M25511 Pain in right shoulder: Secondary | ICD-10-CM | POA: Diagnosis not present

## 2021-10-24 DIAGNOSIS — R488 Other symbolic dysfunctions: Secondary | ICD-10-CM | POA: Diagnosis not present

## 2021-10-26 DIAGNOSIS — R488 Other symbolic dysfunctions: Secondary | ICD-10-CM | POA: Diagnosis not present

## 2021-10-26 DIAGNOSIS — R262 Difficulty in walking, not elsewhere classified: Secondary | ICD-10-CM | POA: Diagnosis not present

## 2021-10-26 DIAGNOSIS — M25512 Pain in left shoulder: Secondary | ICD-10-CM | POA: Diagnosis not present

## 2021-10-26 DIAGNOSIS — R296 Repeated falls: Secondary | ICD-10-CM | POA: Diagnosis not present

## 2021-10-26 DIAGNOSIS — M25511 Pain in right shoulder: Secondary | ICD-10-CM | POA: Diagnosis not present

## 2021-10-26 DIAGNOSIS — M6281 Muscle weakness (generalized): Secondary | ICD-10-CM | POA: Diagnosis not present

## 2021-10-29 DIAGNOSIS — R488 Other symbolic dysfunctions: Secondary | ICD-10-CM | POA: Diagnosis not present

## 2021-10-29 DIAGNOSIS — M6281 Muscle weakness (generalized): Secondary | ICD-10-CM | POA: Diagnosis not present

## 2021-10-29 DIAGNOSIS — R296 Repeated falls: Secondary | ICD-10-CM | POA: Diagnosis not present

## 2021-10-29 DIAGNOSIS — R262 Difficulty in walking, not elsewhere classified: Secondary | ICD-10-CM | POA: Diagnosis not present

## 2021-10-29 DIAGNOSIS — M25512 Pain in left shoulder: Secondary | ICD-10-CM | POA: Diagnosis not present

## 2021-10-29 DIAGNOSIS — M25511 Pain in right shoulder: Secondary | ICD-10-CM | POA: Diagnosis not present

## 2021-10-31 DIAGNOSIS — R296 Repeated falls: Secondary | ICD-10-CM | POA: Diagnosis not present

## 2021-10-31 DIAGNOSIS — M25511 Pain in right shoulder: Secondary | ICD-10-CM | POA: Diagnosis not present

## 2021-10-31 DIAGNOSIS — M6281 Muscle weakness (generalized): Secondary | ICD-10-CM | POA: Diagnosis not present

## 2021-10-31 DIAGNOSIS — R262 Difficulty in walking, not elsewhere classified: Secondary | ICD-10-CM | POA: Diagnosis not present

## 2021-10-31 DIAGNOSIS — N3946 Mixed incontinence: Secondary | ICD-10-CM | POA: Diagnosis not present

## 2021-10-31 DIAGNOSIS — M25512 Pain in left shoulder: Secondary | ICD-10-CM | POA: Diagnosis not present

## 2021-11-01 DIAGNOSIS — H353211 Exudative age-related macular degeneration, right eye, with active choroidal neovascularization: Secondary | ICD-10-CM | POA: Diagnosis not present

## 2021-11-02 DIAGNOSIS — N3946 Mixed incontinence: Secondary | ICD-10-CM | POA: Diagnosis not present

## 2021-11-02 DIAGNOSIS — R296 Repeated falls: Secondary | ICD-10-CM | POA: Diagnosis not present

## 2021-11-02 DIAGNOSIS — R262 Difficulty in walking, not elsewhere classified: Secondary | ICD-10-CM | POA: Diagnosis not present

## 2021-11-02 DIAGNOSIS — M25511 Pain in right shoulder: Secondary | ICD-10-CM | POA: Diagnosis not present

## 2021-11-02 DIAGNOSIS — M25512 Pain in left shoulder: Secondary | ICD-10-CM | POA: Diagnosis not present

## 2021-11-02 DIAGNOSIS — M6281 Muscle weakness (generalized): Secondary | ICD-10-CM | POA: Diagnosis not present

## 2021-11-05 DIAGNOSIS — M25512 Pain in left shoulder: Secondary | ICD-10-CM | POA: Diagnosis not present

## 2021-11-05 DIAGNOSIS — R262 Difficulty in walking, not elsewhere classified: Secondary | ICD-10-CM | POA: Diagnosis not present

## 2021-11-05 DIAGNOSIS — R296 Repeated falls: Secondary | ICD-10-CM | POA: Diagnosis not present

## 2021-11-05 DIAGNOSIS — N3946 Mixed incontinence: Secondary | ICD-10-CM | POA: Diagnosis not present

## 2021-11-05 DIAGNOSIS — M6281 Muscle weakness (generalized): Secondary | ICD-10-CM | POA: Diagnosis not present

## 2021-11-05 DIAGNOSIS — M25511 Pain in right shoulder: Secondary | ICD-10-CM | POA: Diagnosis not present

## 2021-11-07 DIAGNOSIS — N3946 Mixed incontinence: Secondary | ICD-10-CM | POA: Diagnosis not present

## 2021-11-07 DIAGNOSIS — R262 Difficulty in walking, not elsewhere classified: Secondary | ICD-10-CM | POA: Diagnosis not present

## 2021-11-07 DIAGNOSIS — R296 Repeated falls: Secondary | ICD-10-CM | POA: Diagnosis not present

## 2021-11-07 DIAGNOSIS — M25511 Pain in right shoulder: Secondary | ICD-10-CM | POA: Diagnosis not present

## 2021-11-07 DIAGNOSIS — M6281 Muscle weakness (generalized): Secondary | ICD-10-CM | POA: Diagnosis not present

## 2021-11-07 DIAGNOSIS — M25512 Pain in left shoulder: Secondary | ICD-10-CM | POA: Diagnosis not present

## 2021-11-09 DIAGNOSIS — R296 Repeated falls: Secondary | ICD-10-CM | POA: Diagnosis not present

## 2021-11-09 DIAGNOSIS — R262 Difficulty in walking, not elsewhere classified: Secondary | ICD-10-CM | POA: Diagnosis not present

## 2021-11-09 DIAGNOSIS — N3946 Mixed incontinence: Secondary | ICD-10-CM | POA: Diagnosis not present

## 2021-11-09 DIAGNOSIS — M6281 Muscle weakness (generalized): Secondary | ICD-10-CM | POA: Diagnosis not present

## 2021-11-09 DIAGNOSIS — M25512 Pain in left shoulder: Secondary | ICD-10-CM | POA: Diagnosis not present

## 2021-11-09 DIAGNOSIS — M25511 Pain in right shoulder: Secondary | ICD-10-CM | POA: Diagnosis not present

## 2021-11-12 DIAGNOSIS — M6281 Muscle weakness (generalized): Secondary | ICD-10-CM | POA: Diagnosis not present

## 2021-11-12 DIAGNOSIS — M25512 Pain in left shoulder: Secondary | ICD-10-CM | POA: Diagnosis not present

## 2021-11-12 DIAGNOSIS — M25511 Pain in right shoulder: Secondary | ICD-10-CM | POA: Diagnosis not present

## 2021-11-12 DIAGNOSIS — N3946 Mixed incontinence: Secondary | ICD-10-CM | POA: Diagnosis not present

## 2021-11-12 DIAGNOSIS — R262 Difficulty in walking, not elsewhere classified: Secondary | ICD-10-CM | POA: Diagnosis not present

## 2021-11-12 DIAGNOSIS — R296 Repeated falls: Secondary | ICD-10-CM | POA: Diagnosis not present

## 2021-11-13 DIAGNOSIS — N3946 Mixed incontinence: Secondary | ICD-10-CM | POA: Diagnosis not present

## 2021-11-13 DIAGNOSIS — M25511 Pain in right shoulder: Secondary | ICD-10-CM | POA: Diagnosis not present

## 2021-11-13 DIAGNOSIS — M25512 Pain in left shoulder: Secondary | ICD-10-CM | POA: Diagnosis not present

## 2021-11-13 DIAGNOSIS — M6281 Muscle weakness (generalized): Secondary | ICD-10-CM | POA: Diagnosis not present

## 2021-11-13 DIAGNOSIS — R296 Repeated falls: Secondary | ICD-10-CM | POA: Diagnosis not present

## 2021-11-13 DIAGNOSIS — R262 Difficulty in walking, not elsewhere classified: Secondary | ICD-10-CM | POA: Diagnosis not present

## 2021-11-14 DIAGNOSIS — R262 Difficulty in walking, not elsewhere classified: Secondary | ICD-10-CM | POA: Diagnosis not present

## 2021-11-14 DIAGNOSIS — R296 Repeated falls: Secondary | ICD-10-CM | POA: Diagnosis not present

## 2021-11-14 DIAGNOSIS — M25512 Pain in left shoulder: Secondary | ICD-10-CM | POA: Diagnosis not present

## 2021-11-14 DIAGNOSIS — M25511 Pain in right shoulder: Secondary | ICD-10-CM | POA: Diagnosis not present

## 2021-11-14 DIAGNOSIS — N3946 Mixed incontinence: Secondary | ICD-10-CM | POA: Diagnosis not present

## 2021-11-14 DIAGNOSIS — N907 Vulvar cyst: Secondary | ICD-10-CM | POA: Diagnosis not present

## 2021-11-14 DIAGNOSIS — M6281 Muscle weakness (generalized): Secondary | ICD-10-CM | POA: Diagnosis not present

## 2021-11-14 DIAGNOSIS — N760 Acute vaginitis: Secondary | ICD-10-CM | POA: Diagnosis not present

## 2021-11-16 DIAGNOSIS — M25511 Pain in right shoulder: Secondary | ICD-10-CM | POA: Diagnosis not present

## 2021-11-16 DIAGNOSIS — M25512 Pain in left shoulder: Secondary | ICD-10-CM | POA: Diagnosis not present

## 2021-11-16 DIAGNOSIS — N3946 Mixed incontinence: Secondary | ICD-10-CM | POA: Diagnosis not present

## 2021-11-16 DIAGNOSIS — R296 Repeated falls: Secondary | ICD-10-CM | POA: Diagnosis not present

## 2021-11-16 DIAGNOSIS — R262 Difficulty in walking, not elsewhere classified: Secondary | ICD-10-CM | POA: Diagnosis not present

## 2021-11-16 DIAGNOSIS — M6281 Muscle weakness (generalized): Secondary | ICD-10-CM | POA: Diagnosis not present

## 2021-11-19 DIAGNOSIS — R296 Repeated falls: Secondary | ICD-10-CM | POA: Diagnosis not present

## 2021-11-19 DIAGNOSIS — N3946 Mixed incontinence: Secondary | ICD-10-CM | POA: Diagnosis not present

## 2021-11-19 DIAGNOSIS — M6281 Muscle weakness (generalized): Secondary | ICD-10-CM | POA: Diagnosis not present

## 2021-11-19 DIAGNOSIS — M25511 Pain in right shoulder: Secondary | ICD-10-CM | POA: Diagnosis not present

## 2021-11-19 DIAGNOSIS — R262 Difficulty in walking, not elsewhere classified: Secondary | ICD-10-CM | POA: Diagnosis not present

## 2021-11-19 DIAGNOSIS — M25512 Pain in left shoulder: Secondary | ICD-10-CM | POA: Diagnosis not present

## 2021-11-20 DIAGNOSIS — R296 Repeated falls: Secondary | ICD-10-CM | POA: Diagnosis not present

## 2021-11-20 DIAGNOSIS — M25511 Pain in right shoulder: Secondary | ICD-10-CM | POA: Diagnosis not present

## 2021-11-20 DIAGNOSIS — N3946 Mixed incontinence: Secondary | ICD-10-CM | POA: Diagnosis not present

## 2021-11-20 DIAGNOSIS — R262 Difficulty in walking, not elsewhere classified: Secondary | ICD-10-CM | POA: Diagnosis not present

## 2021-11-20 DIAGNOSIS — M25512 Pain in left shoulder: Secondary | ICD-10-CM | POA: Diagnosis not present

## 2021-11-20 DIAGNOSIS — M6281 Muscle weakness (generalized): Secondary | ICD-10-CM | POA: Diagnosis not present

## 2021-11-21 DIAGNOSIS — M25512 Pain in left shoulder: Secondary | ICD-10-CM | POA: Diagnosis not present

## 2021-11-21 DIAGNOSIS — M25511 Pain in right shoulder: Secondary | ICD-10-CM | POA: Diagnosis not present

## 2021-11-21 DIAGNOSIS — R262 Difficulty in walking, not elsewhere classified: Secondary | ICD-10-CM | POA: Diagnosis not present

## 2021-11-21 DIAGNOSIS — R296 Repeated falls: Secondary | ICD-10-CM | POA: Diagnosis not present

## 2021-11-21 DIAGNOSIS — N3946 Mixed incontinence: Secondary | ICD-10-CM | POA: Diagnosis not present

## 2021-11-21 DIAGNOSIS — M6281 Muscle weakness (generalized): Secondary | ICD-10-CM | POA: Diagnosis not present

## 2021-11-23 DIAGNOSIS — N3946 Mixed incontinence: Secondary | ICD-10-CM | POA: Diagnosis not present

## 2021-11-23 DIAGNOSIS — R262 Difficulty in walking, not elsewhere classified: Secondary | ICD-10-CM | POA: Diagnosis not present

## 2021-11-23 DIAGNOSIS — M25511 Pain in right shoulder: Secondary | ICD-10-CM | POA: Diagnosis not present

## 2021-11-23 DIAGNOSIS — R296 Repeated falls: Secondary | ICD-10-CM | POA: Diagnosis not present

## 2021-11-23 DIAGNOSIS — M6281 Muscle weakness (generalized): Secondary | ICD-10-CM | POA: Diagnosis not present

## 2021-11-23 DIAGNOSIS — M25512 Pain in left shoulder: Secondary | ICD-10-CM | POA: Diagnosis not present

## 2021-11-25 DIAGNOSIS — R296 Repeated falls: Secondary | ICD-10-CM | POA: Diagnosis not present

## 2021-11-25 DIAGNOSIS — M25511 Pain in right shoulder: Secondary | ICD-10-CM | POA: Diagnosis not present

## 2021-11-25 DIAGNOSIS — N3946 Mixed incontinence: Secondary | ICD-10-CM | POA: Diagnosis not present

## 2021-11-25 DIAGNOSIS — R262 Difficulty in walking, not elsewhere classified: Secondary | ICD-10-CM | POA: Diagnosis not present

## 2021-11-25 DIAGNOSIS — M6281 Muscle weakness (generalized): Secondary | ICD-10-CM | POA: Diagnosis not present

## 2021-11-25 DIAGNOSIS — M25512 Pain in left shoulder: Secondary | ICD-10-CM | POA: Diagnosis not present

## 2021-11-26 DIAGNOSIS — M25511 Pain in right shoulder: Secondary | ICD-10-CM | POA: Diagnosis not present

## 2021-11-26 DIAGNOSIS — R296 Repeated falls: Secondary | ICD-10-CM | POA: Diagnosis not present

## 2021-11-26 DIAGNOSIS — M6281 Muscle weakness (generalized): Secondary | ICD-10-CM | POA: Diagnosis not present

## 2021-11-26 DIAGNOSIS — M25512 Pain in left shoulder: Secondary | ICD-10-CM | POA: Diagnosis not present

## 2021-11-26 DIAGNOSIS — N3946 Mixed incontinence: Secondary | ICD-10-CM | POA: Diagnosis not present

## 2021-11-26 DIAGNOSIS — R262 Difficulty in walking, not elsewhere classified: Secondary | ICD-10-CM | POA: Diagnosis not present

## 2021-11-27 DIAGNOSIS — N3946 Mixed incontinence: Secondary | ICD-10-CM | POA: Diagnosis not present

## 2021-11-27 DIAGNOSIS — M25511 Pain in right shoulder: Secondary | ICD-10-CM | POA: Diagnosis not present

## 2021-11-27 DIAGNOSIS — R296 Repeated falls: Secondary | ICD-10-CM | POA: Diagnosis not present

## 2021-11-27 DIAGNOSIS — M6281 Muscle weakness (generalized): Secondary | ICD-10-CM | POA: Diagnosis not present

## 2021-11-27 DIAGNOSIS — M25512 Pain in left shoulder: Secondary | ICD-10-CM | POA: Diagnosis not present

## 2021-11-27 DIAGNOSIS — R262 Difficulty in walking, not elsewhere classified: Secondary | ICD-10-CM | POA: Diagnosis not present

## 2021-11-30 DIAGNOSIS — N3946 Mixed incontinence: Secondary | ICD-10-CM | POA: Diagnosis not present

## 2021-11-30 DIAGNOSIS — R296 Repeated falls: Secondary | ICD-10-CM | POA: Diagnosis not present

## 2021-11-30 DIAGNOSIS — M25511 Pain in right shoulder: Secondary | ICD-10-CM | POA: Diagnosis not present

## 2021-11-30 DIAGNOSIS — M25512 Pain in left shoulder: Secondary | ICD-10-CM | POA: Diagnosis not present

## 2021-11-30 DIAGNOSIS — R262 Difficulty in walking, not elsewhere classified: Secondary | ICD-10-CM | POA: Diagnosis not present

## 2021-11-30 DIAGNOSIS — M6281 Muscle weakness (generalized): Secondary | ICD-10-CM | POA: Diagnosis not present

## 2021-12-03 DIAGNOSIS — M25512 Pain in left shoulder: Secondary | ICD-10-CM | POA: Diagnosis not present

## 2021-12-03 DIAGNOSIS — N3946 Mixed incontinence: Secondary | ICD-10-CM | POA: Diagnosis not present

## 2021-12-03 DIAGNOSIS — M6281 Muscle weakness (generalized): Secondary | ICD-10-CM | POA: Diagnosis not present

## 2021-12-03 DIAGNOSIS — R262 Difficulty in walking, not elsewhere classified: Secondary | ICD-10-CM | POA: Diagnosis not present

## 2021-12-03 DIAGNOSIS — M25511 Pain in right shoulder: Secondary | ICD-10-CM | POA: Diagnosis not present

## 2021-12-03 DIAGNOSIS — R296 Repeated falls: Secondary | ICD-10-CM | POA: Diagnosis not present

## 2021-12-04 DIAGNOSIS — M6281 Muscle weakness (generalized): Secondary | ICD-10-CM | POA: Diagnosis not present

## 2021-12-04 DIAGNOSIS — M25511 Pain in right shoulder: Secondary | ICD-10-CM | POA: Diagnosis not present

## 2021-12-04 DIAGNOSIS — M25512 Pain in left shoulder: Secondary | ICD-10-CM | POA: Diagnosis not present

## 2021-12-04 DIAGNOSIS — N3946 Mixed incontinence: Secondary | ICD-10-CM | POA: Diagnosis not present

## 2021-12-04 DIAGNOSIS — R262 Difficulty in walking, not elsewhere classified: Secondary | ICD-10-CM | POA: Diagnosis not present

## 2021-12-04 DIAGNOSIS — R296 Repeated falls: Secondary | ICD-10-CM | POA: Diagnosis not present

## 2021-12-05 DIAGNOSIS — M25512 Pain in left shoulder: Secondary | ICD-10-CM | POA: Diagnosis not present

## 2021-12-05 DIAGNOSIS — M6281 Muscle weakness (generalized): Secondary | ICD-10-CM | POA: Diagnosis not present

## 2021-12-05 DIAGNOSIS — N3946 Mixed incontinence: Secondary | ICD-10-CM | POA: Diagnosis not present

## 2021-12-05 DIAGNOSIS — R262 Difficulty in walking, not elsewhere classified: Secondary | ICD-10-CM | POA: Diagnosis not present

## 2021-12-05 DIAGNOSIS — M25511 Pain in right shoulder: Secondary | ICD-10-CM | POA: Diagnosis not present

## 2021-12-05 DIAGNOSIS — R296 Repeated falls: Secondary | ICD-10-CM | POA: Diagnosis not present

## 2021-12-06 DIAGNOSIS — M25511 Pain in right shoulder: Secondary | ICD-10-CM | POA: Diagnosis not present

## 2021-12-06 DIAGNOSIS — R296 Repeated falls: Secondary | ICD-10-CM | POA: Diagnosis not present

## 2021-12-06 DIAGNOSIS — N3946 Mixed incontinence: Secondary | ICD-10-CM | POA: Diagnosis not present

## 2021-12-06 DIAGNOSIS — R262 Difficulty in walking, not elsewhere classified: Secondary | ICD-10-CM | POA: Diagnosis not present

## 2021-12-06 DIAGNOSIS — M25512 Pain in left shoulder: Secondary | ICD-10-CM | POA: Diagnosis not present

## 2021-12-06 DIAGNOSIS — M6281 Muscle weakness (generalized): Secondary | ICD-10-CM | POA: Diagnosis not present

## 2021-12-07 DIAGNOSIS — M6281 Muscle weakness (generalized): Secondary | ICD-10-CM | POA: Diagnosis not present

## 2021-12-07 DIAGNOSIS — M25512 Pain in left shoulder: Secondary | ICD-10-CM | POA: Diagnosis not present

## 2021-12-07 DIAGNOSIS — R296 Repeated falls: Secondary | ICD-10-CM | POA: Diagnosis not present

## 2021-12-07 DIAGNOSIS — N3946 Mixed incontinence: Secondary | ICD-10-CM | POA: Diagnosis not present

## 2021-12-07 DIAGNOSIS — R262 Difficulty in walking, not elsewhere classified: Secondary | ICD-10-CM | POA: Diagnosis not present

## 2021-12-07 DIAGNOSIS — M25511 Pain in right shoulder: Secondary | ICD-10-CM | POA: Diagnosis not present

## 2021-12-10 DIAGNOSIS — N3946 Mixed incontinence: Secondary | ICD-10-CM | POA: Diagnosis not present

## 2021-12-10 DIAGNOSIS — M25511 Pain in right shoulder: Secondary | ICD-10-CM | POA: Diagnosis not present

## 2021-12-10 DIAGNOSIS — R262 Difficulty in walking, not elsewhere classified: Secondary | ICD-10-CM | POA: Diagnosis not present

## 2021-12-10 DIAGNOSIS — M6281 Muscle weakness (generalized): Secondary | ICD-10-CM | POA: Diagnosis not present

## 2021-12-10 DIAGNOSIS — R296 Repeated falls: Secondary | ICD-10-CM | POA: Diagnosis not present

## 2021-12-10 DIAGNOSIS — M25512 Pain in left shoulder: Secondary | ICD-10-CM | POA: Diagnosis not present

## 2021-12-12 DIAGNOSIS — H353211 Exudative age-related macular degeneration, right eye, with active choroidal neovascularization: Secondary | ICD-10-CM | POA: Diagnosis not present

## 2021-12-12 DIAGNOSIS — M6281 Muscle weakness (generalized): Secondary | ICD-10-CM | POA: Diagnosis not present

## 2021-12-12 DIAGNOSIS — R262 Difficulty in walking, not elsewhere classified: Secondary | ICD-10-CM | POA: Diagnosis not present

## 2021-12-12 DIAGNOSIS — N3946 Mixed incontinence: Secondary | ICD-10-CM | POA: Diagnosis not present

## 2021-12-12 DIAGNOSIS — M25512 Pain in left shoulder: Secondary | ICD-10-CM | POA: Diagnosis not present

## 2021-12-12 DIAGNOSIS — R296 Repeated falls: Secondary | ICD-10-CM | POA: Diagnosis not present

## 2021-12-12 DIAGNOSIS — M25511 Pain in right shoulder: Secondary | ICD-10-CM | POA: Diagnosis not present

## 2021-12-14 DIAGNOSIS — M6281 Muscle weakness (generalized): Secondary | ICD-10-CM | POA: Diagnosis not present

## 2021-12-14 DIAGNOSIS — M25511 Pain in right shoulder: Secondary | ICD-10-CM | POA: Diagnosis not present

## 2021-12-14 DIAGNOSIS — R262 Difficulty in walking, not elsewhere classified: Secondary | ICD-10-CM | POA: Diagnosis not present

## 2021-12-14 DIAGNOSIS — R296 Repeated falls: Secondary | ICD-10-CM | POA: Diagnosis not present

## 2021-12-14 DIAGNOSIS — N3946 Mixed incontinence: Secondary | ICD-10-CM | POA: Diagnosis not present

## 2021-12-14 DIAGNOSIS — M25512 Pain in left shoulder: Secondary | ICD-10-CM | POA: Diagnosis not present

## 2021-12-16 DIAGNOSIS — R262 Difficulty in walking, not elsewhere classified: Secondary | ICD-10-CM | POA: Diagnosis not present

## 2021-12-16 DIAGNOSIS — M25511 Pain in right shoulder: Secondary | ICD-10-CM | POA: Diagnosis not present

## 2021-12-16 DIAGNOSIS — N3946 Mixed incontinence: Secondary | ICD-10-CM | POA: Diagnosis not present

## 2021-12-16 DIAGNOSIS — M25512 Pain in left shoulder: Secondary | ICD-10-CM | POA: Diagnosis not present

## 2021-12-16 DIAGNOSIS — R296 Repeated falls: Secondary | ICD-10-CM | POA: Diagnosis not present

## 2021-12-16 DIAGNOSIS — M6281 Muscle weakness (generalized): Secondary | ICD-10-CM | POA: Diagnosis not present

## 2021-12-17 DIAGNOSIS — M25512 Pain in left shoulder: Secondary | ICD-10-CM | POA: Diagnosis not present

## 2021-12-17 DIAGNOSIS — M25511 Pain in right shoulder: Secondary | ICD-10-CM | POA: Diagnosis not present

## 2021-12-17 DIAGNOSIS — N3946 Mixed incontinence: Secondary | ICD-10-CM | POA: Diagnosis not present

## 2021-12-17 DIAGNOSIS — R296 Repeated falls: Secondary | ICD-10-CM | POA: Diagnosis not present

## 2021-12-17 DIAGNOSIS — M6281 Muscle weakness (generalized): Secondary | ICD-10-CM | POA: Diagnosis not present

## 2021-12-17 DIAGNOSIS — R262 Difficulty in walking, not elsewhere classified: Secondary | ICD-10-CM | POA: Diagnosis not present

## 2021-12-18 DIAGNOSIS — N3946 Mixed incontinence: Secondary | ICD-10-CM | POA: Diagnosis not present

## 2021-12-18 DIAGNOSIS — M25512 Pain in left shoulder: Secondary | ICD-10-CM | POA: Diagnosis not present

## 2021-12-18 DIAGNOSIS — M25511 Pain in right shoulder: Secondary | ICD-10-CM | POA: Diagnosis not present

## 2021-12-18 DIAGNOSIS — R262 Difficulty in walking, not elsewhere classified: Secondary | ICD-10-CM | POA: Diagnosis not present

## 2021-12-18 DIAGNOSIS — M6281 Muscle weakness (generalized): Secondary | ICD-10-CM | POA: Diagnosis not present

## 2021-12-18 DIAGNOSIS — R296 Repeated falls: Secondary | ICD-10-CM | POA: Diagnosis not present

## 2021-12-19 DIAGNOSIS — M25512 Pain in left shoulder: Secondary | ICD-10-CM | POA: Diagnosis not present

## 2021-12-19 DIAGNOSIS — N3946 Mixed incontinence: Secondary | ICD-10-CM | POA: Diagnosis not present

## 2021-12-19 DIAGNOSIS — M25511 Pain in right shoulder: Secondary | ICD-10-CM | POA: Diagnosis not present

## 2021-12-19 DIAGNOSIS — M6281 Muscle weakness (generalized): Secondary | ICD-10-CM | POA: Diagnosis not present

## 2021-12-19 DIAGNOSIS — R296 Repeated falls: Secondary | ICD-10-CM | POA: Diagnosis not present

## 2021-12-19 DIAGNOSIS — R262 Difficulty in walking, not elsewhere classified: Secondary | ICD-10-CM | POA: Diagnosis not present

## 2021-12-21 DIAGNOSIS — M25512 Pain in left shoulder: Secondary | ICD-10-CM | POA: Diagnosis not present

## 2021-12-21 DIAGNOSIS — R296 Repeated falls: Secondary | ICD-10-CM | POA: Diagnosis not present

## 2021-12-21 DIAGNOSIS — N3946 Mixed incontinence: Secondary | ICD-10-CM | POA: Diagnosis not present

## 2021-12-21 DIAGNOSIS — M6281 Muscle weakness (generalized): Secondary | ICD-10-CM | POA: Diagnosis not present

## 2021-12-21 DIAGNOSIS — M25511 Pain in right shoulder: Secondary | ICD-10-CM | POA: Diagnosis not present

## 2021-12-21 DIAGNOSIS — R262 Difficulty in walking, not elsewhere classified: Secondary | ICD-10-CM | POA: Diagnosis not present

## 2021-12-25 DIAGNOSIS — N3946 Mixed incontinence: Secondary | ICD-10-CM | POA: Diagnosis not present

## 2021-12-25 DIAGNOSIS — R262 Difficulty in walking, not elsewhere classified: Secondary | ICD-10-CM | POA: Diagnosis not present

## 2021-12-25 DIAGNOSIS — M6281 Muscle weakness (generalized): Secondary | ICD-10-CM | POA: Diagnosis not present

## 2021-12-25 DIAGNOSIS — M25511 Pain in right shoulder: Secondary | ICD-10-CM | POA: Diagnosis not present

## 2021-12-25 DIAGNOSIS — M25512 Pain in left shoulder: Secondary | ICD-10-CM | POA: Diagnosis not present

## 2021-12-25 DIAGNOSIS — R296 Repeated falls: Secondary | ICD-10-CM | POA: Diagnosis not present

## 2021-12-26 DIAGNOSIS — R262 Difficulty in walking, not elsewhere classified: Secondary | ICD-10-CM | POA: Diagnosis not present

## 2021-12-26 DIAGNOSIS — R296 Repeated falls: Secondary | ICD-10-CM | POA: Diagnosis not present

## 2021-12-26 DIAGNOSIS — M6281 Muscle weakness (generalized): Secondary | ICD-10-CM | POA: Diagnosis not present

## 2021-12-26 DIAGNOSIS — N3946 Mixed incontinence: Secondary | ICD-10-CM | POA: Diagnosis not present

## 2021-12-26 DIAGNOSIS — M25511 Pain in right shoulder: Secondary | ICD-10-CM | POA: Diagnosis not present

## 2021-12-26 DIAGNOSIS — M25512 Pain in left shoulder: Secondary | ICD-10-CM | POA: Diagnosis not present

## 2021-12-27 DIAGNOSIS — R262 Difficulty in walking, not elsewhere classified: Secondary | ICD-10-CM | POA: Diagnosis not present

## 2021-12-27 DIAGNOSIS — N3946 Mixed incontinence: Secondary | ICD-10-CM | POA: Diagnosis not present

## 2021-12-27 DIAGNOSIS — M25512 Pain in left shoulder: Secondary | ICD-10-CM | POA: Diagnosis not present

## 2021-12-27 DIAGNOSIS — R296 Repeated falls: Secondary | ICD-10-CM | POA: Diagnosis not present

## 2021-12-27 DIAGNOSIS — M6281 Muscle weakness (generalized): Secondary | ICD-10-CM | POA: Diagnosis not present

## 2021-12-27 DIAGNOSIS — M25511 Pain in right shoulder: Secondary | ICD-10-CM | POA: Diagnosis not present

## 2021-12-29 DIAGNOSIS — R296 Repeated falls: Secondary | ICD-10-CM | POA: Diagnosis not present

## 2021-12-29 DIAGNOSIS — N3946 Mixed incontinence: Secondary | ICD-10-CM | POA: Diagnosis not present

## 2021-12-29 DIAGNOSIS — M25511 Pain in right shoulder: Secondary | ICD-10-CM | POA: Diagnosis not present

## 2021-12-29 DIAGNOSIS — R262 Difficulty in walking, not elsewhere classified: Secondary | ICD-10-CM | POA: Diagnosis not present

## 2021-12-29 DIAGNOSIS — M6281 Muscle weakness (generalized): Secondary | ICD-10-CM | POA: Diagnosis not present

## 2021-12-29 DIAGNOSIS — M25512 Pain in left shoulder: Secondary | ICD-10-CM | POA: Diagnosis not present

## 2022-01-01 DIAGNOSIS — R296 Repeated falls: Secondary | ICD-10-CM | POA: Diagnosis not present

## 2022-01-01 DIAGNOSIS — M6281 Muscle weakness (generalized): Secondary | ICD-10-CM | POA: Diagnosis not present

## 2022-01-01 DIAGNOSIS — M25511 Pain in right shoulder: Secondary | ICD-10-CM | POA: Diagnosis not present

## 2022-01-01 DIAGNOSIS — M25512 Pain in left shoulder: Secondary | ICD-10-CM | POA: Diagnosis not present

## 2022-01-01 DIAGNOSIS — N3946 Mixed incontinence: Secondary | ICD-10-CM | POA: Diagnosis not present

## 2022-01-01 DIAGNOSIS — R262 Difficulty in walking, not elsewhere classified: Secondary | ICD-10-CM | POA: Diagnosis not present

## 2022-01-02 DIAGNOSIS — M6281 Muscle weakness (generalized): Secondary | ICD-10-CM | POA: Diagnosis not present

## 2022-01-02 DIAGNOSIS — R262 Difficulty in walking, not elsewhere classified: Secondary | ICD-10-CM | POA: Diagnosis not present

## 2022-01-02 DIAGNOSIS — M25511 Pain in right shoulder: Secondary | ICD-10-CM | POA: Diagnosis not present

## 2022-01-02 DIAGNOSIS — N3946 Mixed incontinence: Secondary | ICD-10-CM | POA: Diagnosis not present

## 2022-01-02 DIAGNOSIS — N390 Urinary tract infection, site not specified: Secondary | ICD-10-CM | POA: Diagnosis not present

## 2022-01-02 DIAGNOSIS — R296 Repeated falls: Secondary | ICD-10-CM | POA: Diagnosis not present

## 2022-01-02 DIAGNOSIS — M25512 Pain in left shoulder: Secondary | ICD-10-CM | POA: Diagnosis not present

## 2022-01-02 DIAGNOSIS — R300 Dysuria: Secondary | ICD-10-CM | POA: Diagnosis not present

## 2022-01-03 DIAGNOSIS — M25511 Pain in right shoulder: Secondary | ICD-10-CM | POA: Diagnosis not present

## 2022-01-03 DIAGNOSIS — N3946 Mixed incontinence: Secondary | ICD-10-CM | POA: Diagnosis not present

## 2022-01-03 DIAGNOSIS — R262 Difficulty in walking, not elsewhere classified: Secondary | ICD-10-CM | POA: Diagnosis not present

## 2022-01-03 DIAGNOSIS — M25512 Pain in left shoulder: Secondary | ICD-10-CM | POA: Diagnosis not present

## 2022-01-03 DIAGNOSIS — R296 Repeated falls: Secondary | ICD-10-CM | POA: Diagnosis not present

## 2022-01-03 DIAGNOSIS — M6281 Muscle weakness (generalized): Secondary | ICD-10-CM | POA: Diagnosis not present

## 2022-01-04 DIAGNOSIS — N3946 Mixed incontinence: Secondary | ICD-10-CM | POA: Diagnosis not present

## 2022-01-04 DIAGNOSIS — M6281 Muscle weakness (generalized): Secondary | ICD-10-CM | POA: Diagnosis not present

## 2022-01-04 DIAGNOSIS — M25511 Pain in right shoulder: Secondary | ICD-10-CM | POA: Diagnosis not present

## 2022-01-04 DIAGNOSIS — M25512 Pain in left shoulder: Secondary | ICD-10-CM | POA: Diagnosis not present

## 2022-01-04 DIAGNOSIS — R262 Difficulty in walking, not elsewhere classified: Secondary | ICD-10-CM | POA: Diagnosis not present

## 2022-01-04 DIAGNOSIS — R296 Repeated falls: Secondary | ICD-10-CM | POA: Diagnosis not present

## 2022-01-06 DIAGNOSIS — M25511 Pain in right shoulder: Secondary | ICD-10-CM | POA: Diagnosis not present

## 2022-01-06 DIAGNOSIS — M6281 Muscle weakness (generalized): Secondary | ICD-10-CM | POA: Diagnosis not present

## 2022-01-06 DIAGNOSIS — M25512 Pain in left shoulder: Secondary | ICD-10-CM | POA: Diagnosis not present

## 2022-01-06 DIAGNOSIS — R262 Difficulty in walking, not elsewhere classified: Secondary | ICD-10-CM | POA: Diagnosis not present

## 2022-01-06 DIAGNOSIS — R296 Repeated falls: Secondary | ICD-10-CM | POA: Diagnosis not present

## 2022-01-06 DIAGNOSIS — N3946 Mixed incontinence: Secondary | ICD-10-CM | POA: Diagnosis not present

## 2022-01-07 DIAGNOSIS — R262 Difficulty in walking, not elsewhere classified: Secondary | ICD-10-CM | POA: Diagnosis not present

## 2022-01-07 DIAGNOSIS — R296 Repeated falls: Secondary | ICD-10-CM | POA: Diagnosis not present

## 2022-01-07 DIAGNOSIS — N3946 Mixed incontinence: Secondary | ICD-10-CM | POA: Diagnosis not present

## 2022-01-07 DIAGNOSIS — M25512 Pain in left shoulder: Secondary | ICD-10-CM | POA: Diagnosis not present

## 2022-01-07 DIAGNOSIS — M6281 Muscle weakness (generalized): Secondary | ICD-10-CM | POA: Diagnosis not present

## 2022-01-07 DIAGNOSIS — M25511 Pain in right shoulder: Secondary | ICD-10-CM | POA: Diagnosis not present

## 2022-01-08 DIAGNOSIS — M6281 Muscle weakness (generalized): Secondary | ICD-10-CM | POA: Diagnosis not present

## 2022-01-08 DIAGNOSIS — M25512 Pain in left shoulder: Secondary | ICD-10-CM | POA: Diagnosis not present

## 2022-01-08 DIAGNOSIS — R296 Repeated falls: Secondary | ICD-10-CM | POA: Diagnosis not present

## 2022-01-08 DIAGNOSIS — R262 Difficulty in walking, not elsewhere classified: Secondary | ICD-10-CM | POA: Diagnosis not present

## 2022-01-08 DIAGNOSIS — M25511 Pain in right shoulder: Secondary | ICD-10-CM | POA: Diagnosis not present

## 2022-01-08 DIAGNOSIS — N3946 Mixed incontinence: Secondary | ICD-10-CM | POA: Diagnosis not present

## 2022-01-09 DIAGNOSIS — M6281 Muscle weakness (generalized): Secondary | ICD-10-CM | POA: Diagnosis not present

## 2022-01-09 DIAGNOSIS — M25512 Pain in left shoulder: Secondary | ICD-10-CM | POA: Diagnosis not present

## 2022-01-09 DIAGNOSIS — M25511 Pain in right shoulder: Secondary | ICD-10-CM | POA: Diagnosis not present

## 2022-01-09 DIAGNOSIS — R296 Repeated falls: Secondary | ICD-10-CM | POA: Diagnosis not present

## 2022-01-09 DIAGNOSIS — R262 Difficulty in walking, not elsewhere classified: Secondary | ICD-10-CM | POA: Diagnosis not present

## 2022-01-09 DIAGNOSIS — N3946 Mixed incontinence: Secondary | ICD-10-CM | POA: Diagnosis not present

## 2022-01-10 DIAGNOSIS — R262 Difficulty in walking, not elsewhere classified: Secondary | ICD-10-CM | POA: Diagnosis not present

## 2022-01-10 DIAGNOSIS — N3946 Mixed incontinence: Secondary | ICD-10-CM | POA: Diagnosis not present

## 2022-01-10 DIAGNOSIS — R296 Repeated falls: Secondary | ICD-10-CM | POA: Diagnosis not present

## 2022-01-10 DIAGNOSIS — M6281 Muscle weakness (generalized): Secondary | ICD-10-CM | POA: Diagnosis not present

## 2022-01-10 DIAGNOSIS — M25511 Pain in right shoulder: Secondary | ICD-10-CM | POA: Diagnosis not present

## 2022-01-10 DIAGNOSIS — M25512 Pain in left shoulder: Secondary | ICD-10-CM | POA: Diagnosis not present

## 2022-01-11 DIAGNOSIS — R296 Repeated falls: Secondary | ICD-10-CM | POA: Diagnosis not present

## 2022-01-11 DIAGNOSIS — N3946 Mixed incontinence: Secondary | ICD-10-CM | POA: Diagnosis not present

## 2022-01-11 DIAGNOSIS — M25511 Pain in right shoulder: Secondary | ICD-10-CM | POA: Diagnosis not present

## 2022-01-11 DIAGNOSIS — R262 Difficulty in walking, not elsewhere classified: Secondary | ICD-10-CM | POA: Diagnosis not present

## 2022-01-11 DIAGNOSIS — M25512 Pain in left shoulder: Secondary | ICD-10-CM | POA: Diagnosis not present

## 2022-01-11 DIAGNOSIS — M6281 Muscle weakness (generalized): Secondary | ICD-10-CM | POA: Diagnosis not present

## 2022-01-14 DIAGNOSIS — N3946 Mixed incontinence: Secondary | ICD-10-CM | POA: Diagnosis not present

## 2022-01-14 DIAGNOSIS — M6281 Muscle weakness (generalized): Secondary | ICD-10-CM | POA: Diagnosis not present

## 2022-01-14 DIAGNOSIS — R296 Repeated falls: Secondary | ICD-10-CM | POA: Diagnosis not present

## 2022-01-14 DIAGNOSIS — M25512 Pain in left shoulder: Secondary | ICD-10-CM | POA: Diagnosis not present

## 2022-01-14 DIAGNOSIS — M25511 Pain in right shoulder: Secondary | ICD-10-CM | POA: Diagnosis not present

## 2022-01-14 DIAGNOSIS — R262 Difficulty in walking, not elsewhere classified: Secondary | ICD-10-CM | POA: Diagnosis not present

## 2022-01-15 DIAGNOSIS — M25512 Pain in left shoulder: Secondary | ICD-10-CM | POA: Diagnosis not present

## 2022-01-15 DIAGNOSIS — N3946 Mixed incontinence: Secondary | ICD-10-CM | POA: Diagnosis not present

## 2022-01-15 DIAGNOSIS — M25511 Pain in right shoulder: Secondary | ICD-10-CM | POA: Diagnosis not present

## 2022-01-15 DIAGNOSIS — R296 Repeated falls: Secondary | ICD-10-CM | POA: Diagnosis not present

## 2022-01-15 DIAGNOSIS — M6281 Muscle weakness (generalized): Secondary | ICD-10-CM | POA: Diagnosis not present

## 2022-01-15 DIAGNOSIS — R262 Difficulty in walking, not elsewhere classified: Secondary | ICD-10-CM | POA: Diagnosis not present

## 2022-01-17 DIAGNOSIS — M25512 Pain in left shoulder: Secondary | ICD-10-CM | POA: Diagnosis not present

## 2022-01-17 DIAGNOSIS — R262 Difficulty in walking, not elsewhere classified: Secondary | ICD-10-CM | POA: Diagnosis not present

## 2022-01-17 DIAGNOSIS — M25511 Pain in right shoulder: Secondary | ICD-10-CM | POA: Diagnosis not present

## 2022-01-17 DIAGNOSIS — N3946 Mixed incontinence: Secondary | ICD-10-CM | POA: Diagnosis not present

## 2022-01-17 DIAGNOSIS — R296 Repeated falls: Secondary | ICD-10-CM | POA: Diagnosis not present

## 2022-01-17 DIAGNOSIS — M6281 Muscle weakness (generalized): Secondary | ICD-10-CM | POA: Diagnosis not present

## 2022-01-18 DIAGNOSIS — R262 Difficulty in walking, not elsewhere classified: Secondary | ICD-10-CM | POA: Diagnosis not present

## 2022-01-18 DIAGNOSIS — N3946 Mixed incontinence: Secondary | ICD-10-CM | POA: Diagnosis not present

## 2022-01-18 DIAGNOSIS — M25511 Pain in right shoulder: Secondary | ICD-10-CM | POA: Diagnosis not present

## 2022-01-18 DIAGNOSIS — R296 Repeated falls: Secondary | ICD-10-CM | POA: Diagnosis not present

## 2022-01-18 DIAGNOSIS — M6281 Muscle weakness (generalized): Secondary | ICD-10-CM | POA: Diagnosis not present

## 2022-01-18 DIAGNOSIS — M25512 Pain in left shoulder: Secondary | ICD-10-CM | POA: Diagnosis not present

## 2022-01-19 DIAGNOSIS — R262 Difficulty in walking, not elsewhere classified: Secondary | ICD-10-CM | POA: Diagnosis not present

## 2022-01-19 DIAGNOSIS — M25511 Pain in right shoulder: Secondary | ICD-10-CM | POA: Diagnosis not present

## 2022-01-19 DIAGNOSIS — M6281 Muscle weakness (generalized): Secondary | ICD-10-CM | POA: Diagnosis not present

## 2022-01-19 DIAGNOSIS — N3946 Mixed incontinence: Secondary | ICD-10-CM | POA: Diagnosis not present

## 2022-01-19 DIAGNOSIS — R296 Repeated falls: Secondary | ICD-10-CM | POA: Diagnosis not present

## 2022-01-19 DIAGNOSIS — M25512 Pain in left shoulder: Secondary | ICD-10-CM | POA: Diagnosis not present

## 2022-01-20 DIAGNOSIS — M6281 Muscle weakness (generalized): Secondary | ICD-10-CM | POA: Diagnosis not present

## 2022-01-20 DIAGNOSIS — R296 Repeated falls: Secondary | ICD-10-CM | POA: Diagnosis not present

## 2022-01-20 DIAGNOSIS — N3946 Mixed incontinence: Secondary | ICD-10-CM | POA: Diagnosis not present

## 2022-01-20 DIAGNOSIS — M25511 Pain in right shoulder: Secondary | ICD-10-CM | POA: Diagnosis not present

## 2022-01-20 DIAGNOSIS — R262 Difficulty in walking, not elsewhere classified: Secondary | ICD-10-CM | POA: Diagnosis not present

## 2022-01-20 DIAGNOSIS — M25512 Pain in left shoulder: Secondary | ICD-10-CM | POA: Diagnosis not present

## 2022-01-21 DIAGNOSIS — R296 Repeated falls: Secondary | ICD-10-CM | POA: Diagnosis not present

## 2022-01-21 DIAGNOSIS — M25512 Pain in left shoulder: Secondary | ICD-10-CM | POA: Diagnosis not present

## 2022-01-21 DIAGNOSIS — R262 Difficulty in walking, not elsewhere classified: Secondary | ICD-10-CM | POA: Diagnosis not present

## 2022-01-21 DIAGNOSIS — M25511 Pain in right shoulder: Secondary | ICD-10-CM | POA: Diagnosis not present

## 2022-01-21 DIAGNOSIS — N3946 Mixed incontinence: Secondary | ICD-10-CM | POA: Diagnosis not present

## 2022-01-21 DIAGNOSIS — M6281 Muscle weakness (generalized): Secondary | ICD-10-CM | POA: Diagnosis not present

## 2022-01-22 DIAGNOSIS — M6281 Muscle weakness (generalized): Secondary | ICD-10-CM | POA: Diagnosis not present

## 2022-01-22 DIAGNOSIS — R296 Repeated falls: Secondary | ICD-10-CM | POA: Diagnosis not present

## 2022-01-22 DIAGNOSIS — N3946 Mixed incontinence: Secondary | ICD-10-CM | POA: Diagnosis not present

## 2022-01-22 DIAGNOSIS — M25512 Pain in left shoulder: Secondary | ICD-10-CM | POA: Diagnosis not present

## 2022-01-22 DIAGNOSIS — R262 Difficulty in walking, not elsewhere classified: Secondary | ICD-10-CM | POA: Diagnosis not present

## 2022-01-22 DIAGNOSIS — M25511 Pain in right shoulder: Secondary | ICD-10-CM | POA: Diagnosis not present

## 2022-01-23 DIAGNOSIS — M25511 Pain in right shoulder: Secondary | ICD-10-CM | POA: Diagnosis not present

## 2022-01-23 DIAGNOSIS — R262 Difficulty in walking, not elsewhere classified: Secondary | ICD-10-CM | POA: Diagnosis not present

## 2022-01-23 DIAGNOSIS — N3946 Mixed incontinence: Secondary | ICD-10-CM | POA: Diagnosis not present

## 2022-01-23 DIAGNOSIS — M25512 Pain in left shoulder: Secondary | ICD-10-CM | POA: Diagnosis not present

## 2022-01-23 DIAGNOSIS — R296 Repeated falls: Secondary | ICD-10-CM | POA: Diagnosis not present

## 2022-01-23 DIAGNOSIS — M6281 Muscle weakness (generalized): Secondary | ICD-10-CM | POA: Diagnosis not present

## 2022-01-24 DIAGNOSIS — M25511 Pain in right shoulder: Secondary | ICD-10-CM | POA: Diagnosis not present

## 2022-01-24 DIAGNOSIS — R296 Repeated falls: Secondary | ICD-10-CM | POA: Diagnosis not present

## 2022-01-24 DIAGNOSIS — R262 Difficulty in walking, not elsewhere classified: Secondary | ICD-10-CM | POA: Diagnosis not present

## 2022-01-24 DIAGNOSIS — M25512 Pain in left shoulder: Secondary | ICD-10-CM | POA: Diagnosis not present

## 2022-01-24 DIAGNOSIS — N3946 Mixed incontinence: Secondary | ICD-10-CM | POA: Diagnosis not present

## 2022-01-24 DIAGNOSIS — M6281 Muscle weakness (generalized): Secondary | ICD-10-CM | POA: Diagnosis not present

## 2022-01-28 DIAGNOSIS — M25512 Pain in left shoulder: Secondary | ICD-10-CM | POA: Diagnosis not present

## 2022-01-28 DIAGNOSIS — N3946 Mixed incontinence: Secondary | ICD-10-CM | POA: Diagnosis not present

## 2022-01-28 DIAGNOSIS — M6281 Muscle weakness (generalized): Secondary | ICD-10-CM | POA: Diagnosis not present

## 2022-01-28 DIAGNOSIS — R262 Difficulty in walking, not elsewhere classified: Secondary | ICD-10-CM | POA: Diagnosis not present

## 2022-01-28 DIAGNOSIS — R296 Repeated falls: Secondary | ICD-10-CM | POA: Diagnosis not present

## 2022-01-28 DIAGNOSIS — M25511 Pain in right shoulder: Secondary | ICD-10-CM | POA: Diagnosis not present

## 2022-01-29 DIAGNOSIS — R296 Repeated falls: Secondary | ICD-10-CM | POA: Diagnosis not present

## 2022-01-29 DIAGNOSIS — M25512 Pain in left shoulder: Secondary | ICD-10-CM | POA: Diagnosis not present

## 2022-01-29 DIAGNOSIS — N3946 Mixed incontinence: Secondary | ICD-10-CM | POA: Diagnosis not present

## 2022-01-29 DIAGNOSIS — M25511 Pain in right shoulder: Secondary | ICD-10-CM | POA: Diagnosis not present

## 2022-01-29 DIAGNOSIS — M6281 Muscle weakness (generalized): Secondary | ICD-10-CM | POA: Diagnosis not present

## 2022-01-29 DIAGNOSIS — R262 Difficulty in walking, not elsewhere classified: Secondary | ICD-10-CM | POA: Diagnosis not present

## 2022-01-30 DIAGNOSIS — M6281 Muscle weakness (generalized): Secondary | ICD-10-CM | POA: Diagnosis not present

## 2022-01-30 DIAGNOSIS — M25512 Pain in left shoulder: Secondary | ICD-10-CM | POA: Diagnosis not present

## 2022-01-30 DIAGNOSIS — N3946 Mixed incontinence: Secondary | ICD-10-CM | POA: Diagnosis not present

## 2022-01-30 DIAGNOSIS — M25511 Pain in right shoulder: Secondary | ICD-10-CM | POA: Diagnosis not present

## 2022-01-30 DIAGNOSIS — R262 Difficulty in walking, not elsewhere classified: Secondary | ICD-10-CM | POA: Diagnosis not present

## 2022-01-30 DIAGNOSIS — R296 Repeated falls: Secondary | ICD-10-CM | POA: Diagnosis not present

## 2022-01-31 DIAGNOSIS — M25512 Pain in left shoulder: Secondary | ICD-10-CM | POA: Diagnosis not present

## 2022-01-31 DIAGNOSIS — R262 Difficulty in walking, not elsewhere classified: Secondary | ICD-10-CM | POA: Diagnosis not present

## 2022-01-31 DIAGNOSIS — M6281 Muscle weakness (generalized): Secondary | ICD-10-CM | POA: Diagnosis not present

## 2022-01-31 DIAGNOSIS — N3946 Mixed incontinence: Secondary | ICD-10-CM | POA: Diagnosis not present

## 2022-01-31 DIAGNOSIS — H353211 Exudative age-related macular degeneration, right eye, with active choroidal neovascularization: Secondary | ICD-10-CM | POA: Diagnosis not present

## 2022-01-31 DIAGNOSIS — M25511 Pain in right shoulder: Secondary | ICD-10-CM | POA: Diagnosis not present

## 2022-01-31 DIAGNOSIS — R296 Repeated falls: Secondary | ICD-10-CM | POA: Diagnosis not present

## 2022-02-04 DIAGNOSIS — M6281 Muscle weakness (generalized): Secondary | ICD-10-CM | POA: Diagnosis not present

## 2022-02-04 DIAGNOSIS — M25511 Pain in right shoulder: Secondary | ICD-10-CM | POA: Diagnosis not present

## 2022-02-04 DIAGNOSIS — R262 Difficulty in walking, not elsewhere classified: Secondary | ICD-10-CM | POA: Diagnosis not present

## 2022-02-04 DIAGNOSIS — R296 Repeated falls: Secondary | ICD-10-CM | POA: Diagnosis not present

## 2022-02-04 DIAGNOSIS — N3946 Mixed incontinence: Secondary | ICD-10-CM | POA: Diagnosis not present

## 2022-02-04 DIAGNOSIS — M25512 Pain in left shoulder: Secondary | ICD-10-CM | POA: Diagnosis not present

## 2022-02-05 DIAGNOSIS — M25512 Pain in left shoulder: Secondary | ICD-10-CM | POA: Diagnosis not present

## 2022-02-05 DIAGNOSIS — N3946 Mixed incontinence: Secondary | ICD-10-CM | POA: Diagnosis not present

## 2022-02-05 DIAGNOSIS — M6281 Muscle weakness (generalized): Secondary | ICD-10-CM | POA: Diagnosis not present

## 2022-02-05 DIAGNOSIS — R296 Repeated falls: Secondary | ICD-10-CM | POA: Diagnosis not present

## 2022-02-05 DIAGNOSIS — R262 Difficulty in walking, not elsewhere classified: Secondary | ICD-10-CM | POA: Diagnosis not present

## 2022-02-05 DIAGNOSIS — M25511 Pain in right shoulder: Secondary | ICD-10-CM | POA: Diagnosis not present

## 2022-02-06 DIAGNOSIS — M25511 Pain in right shoulder: Secondary | ICD-10-CM | POA: Diagnosis not present

## 2022-02-06 DIAGNOSIS — R296 Repeated falls: Secondary | ICD-10-CM | POA: Diagnosis not present

## 2022-02-06 DIAGNOSIS — M25512 Pain in left shoulder: Secondary | ICD-10-CM | POA: Diagnosis not present

## 2022-02-06 DIAGNOSIS — N3946 Mixed incontinence: Secondary | ICD-10-CM | POA: Diagnosis not present

## 2022-02-06 DIAGNOSIS — M6281 Muscle weakness (generalized): Secondary | ICD-10-CM | POA: Diagnosis not present

## 2022-02-06 DIAGNOSIS — R262 Difficulty in walking, not elsewhere classified: Secondary | ICD-10-CM | POA: Diagnosis not present

## 2022-02-08 DIAGNOSIS — N3946 Mixed incontinence: Secondary | ICD-10-CM | POA: Diagnosis not present

## 2022-02-08 DIAGNOSIS — R262 Difficulty in walking, not elsewhere classified: Secondary | ICD-10-CM | POA: Diagnosis not present

## 2022-02-08 DIAGNOSIS — M6281 Muscle weakness (generalized): Secondary | ICD-10-CM | POA: Diagnosis not present

## 2022-02-08 DIAGNOSIS — M25511 Pain in right shoulder: Secondary | ICD-10-CM | POA: Diagnosis not present

## 2022-02-08 DIAGNOSIS — R296 Repeated falls: Secondary | ICD-10-CM | POA: Diagnosis not present

## 2022-02-08 DIAGNOSIS — M25512 Pain in left shoulder: Secondary | ICD-10-CM | POA: Diagnosis not present

## 2022-02-09 DIAGNOSIS — M25511 Pain in right shoulder: Secondary | ICD-10-CM | POA: Diagnosis not present

## 2022-02-09 DIAGNOSIS — M6281 Muscle weakness (generalized): Secondary | ICD-10-CM | POA: Diagnosis not present

## 2022-02-09 DIAGNOSIS — N3946 Mixed incontinence: Secondary | ICD-10-CM | POA: Diagnosis not present

## 2022-02-09 DIAGNOSIS — R296 Repeated falls: Secondary | ICD-10-CM | POA: Diagnosis not present

## 2022-02-09 DIAGNOSIS — R262 Difficulty in walking, not elsewhere classified: Secondary | ICD-10-CM | POA: Diagnosis not present

## 2022-02-09 DIAGNOSIS — M25512 Pain in left shoulder: Secondary | ICD-10-CM | POA: Diagnosis not present

## 2022-02-10 DIAGNOSIS — R262 Difficulty in walking, not elsewhere classified: Secondary | ICD-10-CM | POA: Diagnosis not present

## 2022-02-10 DIAGNOSIS — R296 Repeated falls: Secondary | ICD-10-CM | POA: Diagnosis not present

## 2022-02-10 DIAGNOSIS — M6281 Muscle weakness (generalized): Secondary | ICD-10-CM | POA: Diagnosis not present

## 2022-02-10 DIAGNOSIS — M25512 Pain in left shoulder: Secondary | ICD-10-CM | POA: Diagnosis not present

## 2022-02-10 DIAGNOSIS — N3946 Mixed incontinence: Secondary | ICD-10-CM | POA: Diagnosis not present

## 2022-02-10 DIAGNOSIS — M25511 Pain in right shoulder: Secondary | ICD-10-CM | POA: Diagnosis not present

## 2022-02-11 DIAGNOSIS — N3946 Mixed incontinence: Secondary | ICD-10-CM | POA: Diagnosis not present

## 2022-02-11 DIAGNOSIS — R262 Difficulty in walking, not elsewhere classified: Secondary | ICD-10-CM | POA: Diagnosis not present

## 2022-02-11 DIAGNOSIS — M6281 Muscle weakness (generalized): Secondary | ICD-10-CM | POA: Diagnosis not present

## 2022-02-11 DIAGNOSIS — M25511 Pain in right shoulder: Secondary | ICD-10-CM | POA: Diagnosis not present

## 2022-02-11 DIAGNOSIS — R296 Repeated falls: Secondary | ICD-10-CM | POA: Diagnosis not present

## 2022-02-11 DIAGNOSIS — M25512 Pain in left shoulder: Secondary | ICD-10-CM | POA: Diagnosis not present

## 2022-02-12 DIAGNOSIS — M6281 Muscle weakness (generalized): Secondary | ICD-10-CM | POA: Diagnosis not present

## 2022-02-12 DIAGNOSIS — M25511 Pain in right shoulder: Secondary | ICD-10-CM | POA: Diagnosis not present

## 2022-02-12 DIAGNOSIS — R262 Difficulty in walking, not elsewhere classified: Secondary | ICD-10-CM | POA: Diagnosis not present

## 2022-02-12 DIAGNOSIS — M25512 Pain in left shoulder: Secondary | ICD-10-CM | POA: Diagnosis not present

## 2022-02-12 DIAGNOSIS — N3946 Mixed incontinence: Secondary | ICD-10-CM | POA: Diagnosis not present

## 2022-02-12 DIAGNOSIS — R296 Repeated falls: Secondary | ICD-10-CM | POA: Diagnosis not present

## 2022-02-13 DIAGNOSIS — M6281 Muscle weakness (generalized): Secondary | ICD-10-CM | POA: Diagnosis not present

## 2022-02-13 DIAGNOSIS — M25511 Pain in right shoulder: Secondary | ICD-10-CM | POA: Diagnosis not present

## 2022-02-13 DIAGNOSIS — N3946 Mixed incontinence: Secondary | ICD-10-CM | POA: Diagnosis not present

## 2022-02-13 DIAGNOSIS — M25512 Pain in left shoulder: Secondary | ICD-10-CM | POA: Diagnosis not present

## 2022-02-13 DIAGNOSIS — R296 Repeated falls: Secondary | ICD-10-CM | POA: Diagnosis not present

## 2022-02-13 DIAGNOSIS — R262 Difficulty in walking, not elsewhere classified: Secondary | ICD-10-CM | POA: Diagnosis not present

## 2022-02-14 DIAGNOSIS — M25512 Pain in left shoulder: Secondary | ICD-10-CM | POA: Diagnosis not present

## 2022-02-14 DIAGNOSIS — R262 Difficulty in walking, not elsewhere classified: Secondary | ICD-10-CM | POA: Diagnosis not present

## 2022-02-14 DIAGNOSIS — R296 Repeated falls: Secondary | ICD-10-CM | POA: Diagnosis not present

## 2022-02-14 DIAGNOSIS — M6281 Muscle weakness (generalized): Secondary | ICD-10-CM | POA: Diagnosis not present

## 2022-02-14 DIAGNOSIS — N3946 Mixed incontinence: Secondary | ICD-10-CM | POA: Diagnosis not present

## 2022-02-14 DIAGNOSIS — M25511 Pain in right shoulder: Secondary | ICD-10-CM | POA: Diagnosis not present

## 2022-02-15 DIAGNOSIS — N3942 Incontinence without sensory awareness: Secondary | ICD-10-CM | POA: Diagnosis not present

## 2022-02-15 DIAGNOSIS — R300 Dysuria: Secondary | ICD-10-CM | POA: Diagnosis not present

## 2022-02-15 DIAGNOSIS — R3916 Straining to void: Secondary | ICD-10-CM | POA: Diagnosis not present

## 2022-02-18 DIAGNOSIS — R296 Repeated falls: Secondary | ICD-10-CM | POA: Diagnosis not present

## 2022-02-18 DIAGNOSIS — R262 Difficulty in walking, not elsewhere classified: Secondary | ICD-10-CM | POA: Diagnosis not present

## 2022-02-18 DIAGNOSIS — M25511 Pain in right shoulder: Secondary | ICD-10-CM | POA: Diagnosis not present

## 2022-02-18 DIAGNOSIS — M6281 Muscle weakness (generalized): Secondary | ICD-10-CM | POA: Diagnosis not present

## 2022-02-18 DIAGNOSIS — M25512 Pain in left shoulder: Secondary | ICD-10-CM | POA: Diagnosis not present

## 2022-02-18 DIAGNOSIS — N3946 Mixed incontinence: Secondary | ICD-10-CM | POA: Diagnosis not present

## 2022-02-19 DIAGNOSIS — R296 Repeated falls: Secondary | ICD-10-CM | POA: Diagnosis not present

## 2022-02-19 DIAGNOSIS — R262 Difficulty in walking, not elsewhere classified: Secondary | ICD-10-CM | POA: Diagnosis not present

## 2022-02-19 DIAGNOSIS — M25512 Pain in left shoulder: Secondary | ICD-10-CM | POA: Diagnosis not present

## 2022-02-19 DIAGNOSIS — M25511 Pain in right shoulder: Secondary | ICD-10-CM | POA: Diagnosis not present

## 2022-02-19 DIAGNOSIS — M6281 Muscle weakness (generalized): Secondary | ICD-10-CM | POA: Diagnosis not present

## 2022-02-19 DIAGNOSIS — N3946 Mixed incontinence: Secondary | ICD-10-CM | POA: Diagnosis not present

## 2022-02-20 DIAGNOSIS — M6281 Muscle weakness (generalized): Secondary | ICD-10-CM | POA: Diagnosis not present

## 2022-02-20 DIAGNOSIS — M25511 Pain in right shoulder: Secondary | ICD-10-CM | POA: Diagnosis not present

## 2022-02-20 DIAGNOSIS — M25512 Pain in left shoulder: Secondary | ICD-10-CM | POA: Diagnosis not present

## 2022-02-20 DIAGNOSIS — R262 Difficulty in walking, not elsewhere classified: Secondary | ICD-10-CM | POA: Diagnosis not present

## 2022-02-20 DIAGNOSIS — R296 Repeated falls: Secondary | ICD-10-CM | POA: Diagnosis not present

## 2022-02-20 DIAGNOSIS — N3946 Mixed incontinence: Secondary | ICD-10-CM | POA: Diagnosis not present

## 2022-02-21 DIAGNOSIS — M25512 Pain in left shoulder: Secondary | ICD-10-CM | POA: Diagnosis not present

## 2022-02-21 DIAGNOSIS — N3946 Mixed incontinence: Secondary | ICD-10-CM | POA: Diagnosis not present

## 2022-02-21 DIAGNOSIS — M6281 Muscle weakness (generalized): Secondary | ICD-10-CM | POA: Diagnosis not present

## 2022-02-21 DIAGNOSIS — M25511 Pain in right shoulder: Secondary | ICD-10-CM | POA: Diagnosis not present

## 2022-02-21 DIAGNOSIS — R262 Difficulty in walking, not elsewhere classified: Secondary | ICD-10-CM | POA: Diagnosis not present

## 2022-02-21 DIAGNOSIS — R296 Repeated falls: Secondary | ICD-10-CM | POA: Diagnosis not present

## 2022-02-23 DIAGNOSIS — M25511 Pain in right shoulder: Secondary | ICD-10-CM | POA: Diagnosis not present

## 2022-02-23 DIAGNOSIS — R262 Difficulty in walking, not elsewhere classified: Secondary | ICD-10-CM | POA: Diagnosis not present

## 2022-02-23 DIAGNOSIS — N3946 Mixed incontinence: Secondary | ICD-10-CM | POA: Diagnosis not present

## 2022-02-23 DIAGNOSIS — M6281 Muscle weakness (generalized): Secondary | ICD-10-CM | POA: Diagnosis not present

## 2022-02-23 DIAGNOSIS — R296 Repeated falls: Secondary | ICD-10-CM | POA: Diagnosis not present

## 2022-02-23 DIAGNOSIS — M25512 Pain in left shoulder: Secondary | ICD-10-CM | POA: Diagnosis not present

## 2022-02-25 DIAGNOSIS — R262 Difficulty in walking, not elsewhere classified: Secondary | ICD-10-CM | POA: Diagnosis not present

## 2022-02-25 DIAGNOSIS — M6281 Muscle weakness (generalized): Secondary | ICD-10-CM | POA: Diagnosis not present

## 2022-02-25 DIAGNOSIS — N3946 Mixed incontinence: Secondary | ICD-10-CM | POA: Diagnosis not present

## 2022-02-25 DIAGNOSIS — M25511 Pain in right shoulder: Secondary | ICD-10-CM | POA: Diagnosis not present

## 2022-02-25 DIAGNOSIS — M25512 Pain in left shoulder: Secondary | ICD-10-CM | POA: Diagnosis not present

## 2022-02-25 DIAGNOSIS — R296 Repeated falls: Secondary | ICD-10-CM | POA: Diagnosis not present

## 2022-02-26 DIAGNOSIS — M25511 Pain in right shoulder: Secondary | ICD-10-CM | POA: Diagnosis not present

## 2022-02-26 DIAGNOSIS — N3946 Mixed incontinence: Secondary | ICD-10-CM | POA: Diagnosis not present

## 2022-02-26 DIAGNOSIS — R296 Repeated falls: Secondary | ICD-10-CM | POA: Diagnosis not present

## 2022-02-26 DIAGNOSIS — M6281 Muscle weakness (generalized): Secondary | ICD-10-CM | POA: Diagnosis not present

## 2022-02-26 DIAGNOSIS — M25512 Pain in left shoulder: Secondary | ICD-10-CM | POA: Diagnosis not present

## 2022-02-26 DIAGNOSIS — R262 Difficulty in walking, not elsewhere classified: Secondary | ICD-10-CM | POA: Diagnosis not present

## 2022-02-27 DIAGNOSIS — M6281 Muscle weakness (generalized): Secondary | ICD-10-CM | POA: Diagnosis not present

## 2022-02-27 DIAGNOSIS — M25511 Pain in right shoulder: Secondary | ICD-10-CM | POA: Diagnosis not present

## 2022-02-27 DIAGNOSIS — R262 Difficulty in walking, not elsewhere classified: Secondary | ICD-10-CM | POA: Diagnosis not present

## 2022-02-27 DIAGNOSIS — N3946 Mixed incontinence: Secondary | ICD-10-CM | POA: Diagnosis not present

## 2022-02-27 DIAGNOSIS — M25512 Pain in left shoulder: Secondary | ICD-10-CM | POA: Diagnosis not present

## 2022-02-27 DIAGNOSIS — R296 Repeated falls: Secondary | ICD-10-CM | POA: Diagnosis not present

## 2022-02-28 DIAGNOSIS — R296 Repeated falls: Secondary | ICD-10-CM | POA: Diagnosis not present

## 2022-02-28 DIAGNOSIS — N3946 Mixed incontinence: Secondary | ICD-10-CM | POA: Diagnosis not present

## 2022-02-28 DIAGNOSIS — M6281 Muscle weakness (generalized): Secondary | ICD-10-CM | POA: Diagnosis not present

## 2022-02-28 DIAGNOSIS — R262 Difficulty in walking, not elsewhere classified: Secondary | ICD-10-CM | POA: Diagnosis not present

## 2022-02-28 DIAGNOSIS — M25512 Pain in left shoulder: Secondary | ICD-10-CM | POA: Diagnosis not present

## 2022-02-28 DIAGNOSIS — M25511 Pain in right shoulder: Secondary | ICD-10-CM | POA: Diagnosis not present

## 2022-03-03 DIAGNOSIS — M25511 Pain in right shoulder: Secondary | ICD-10-CM | POA: Diagnosis not present

## 2022-03-03 DIAGNOSIS — N3946 Mixed incontinence: Secondary | ICD-10-CM | POA: Diagnosis not present

## 2022-03-03 DIAGNOSIS — M25512 Pain in left shoulder: Secondary | ICD-10-CM | POA: Diagnosis not present

## 2022-03-03 DIAGNOSIS — R296 Repeated falls: Secondary | ICD-10-CM | POA: Diagnosis not present

## 2022-03-03 DIAGNOSIS — R262 Difficulty in walking, not elsewhere classified: Secondary | ICD-10-CM | POA: Diagnosis not present

## 2022-03-03 DIAGNOSIS — M6281 Muscle weakness (generalized): Secondary | ICD-10-CM | POA: Diagnosis not present

## 2022-03-05 DIAGNOSIS — R296 Repeated falls: Secondary | ICD-10-CM | POA: Diagnosis not present

## 2022-03-05 DIAGNOSIS — R262 Difficulty in walking, not elsewhere classified: Secondary | ICD-10-CM | POA: Diagnosis not present

## 2022-03-05 DIAGNOSIS — M25512 Pain in left shoulder: Secondary | ICD-10-CM | POA: Diagnosis not present

## 2022-03-05 DIAGNOSIS — N3946 Mixed incontinence: Secondary | ICD-10-CM | POA: Diagnosis not present

## 2022-03-05 DIAGNOSIS — M25511 Pain in right shoulder: Secondary | ICD-10-CM | POA: Diagnosis not present

## 2022-03-05 DIAGNOSIS — M6281 Muscle weakness (generalized): Secondary | ICD-10-CM | POA: Diagnosis not present

## 2022-03-06 ENCOUNTER — Other Ambulatory Visit: Payer: Self-pay

## 2022-03-06 ENCOUNTER — Ambulatory Visit: Payer: PPO | Admitting: Physician Assistant

## 2022-03-06 ENCOUNTER — Encounter: Payer: Self-pay | Admitting: Physician Assistant

## 2022-03-06 VITALS — BP 141/87 | HR 96 | Resp 20 | Ht 63.0 in | Wt 190.0 lb

## 2022-03-06 DIAGNOSIS — F0150 Vascular dementia without behavioral disturbance: Secondary | ICD-10-CM | POA: Diagnosis not present

## 2022-03-06 NOTE — Patient Instructions (Signed)
Continue Memantine 10mg  twice a day  2. Continue close supervision  3. Follow-up in 6 months, call for any changes   FALL PRECAUTIONS: Be cautious when walking. Scan the area for obstacles that may increase the risk of trips and falls. When getting up in the mornings, sit up at the edge of the bed for a few minutes before getting out of bed. Consider elevating the bed at the head end to avoid drop of blood pressure when getting up. Walk always in a well-lit room (use night lights in the walls). Avoid area rugs or power cords from appliances in the middle of the walkways. Use a walker or a cane if necessary and consider physical therapy for balance exercise. Get your eyesight checked regularly.  FINANCIAL OVERSIGHT: Supervision, especially oversight when making financial decisions or transactions is also recommended.  HOME SAFETY: Consider the safety of the kitchen when operating appliances like stoves, microwave oven, and blender. Consider having supervision and share cooking responsibilities until no longer able to participate in those. Accidents with firearms and other hazards in the house should be identified and addressed as well.  DRIVING: Regarding driving, in patients with progressive memory problems, driving will be impaired. We advise to have someone else do the driving if trouble finding directions or if minor accidents are reported. Independent driving assessment is available to determine safety of driving.  ABILITY TO BE LEFT ALONE: If patient is unable to contact 911 operator, consider using LifeLine, or when the need is there, arrange for someone to stay with patients. Smoking is a fire hazard, consider supervision or cessation. Risk of wandering should be assessed by caregiver and if detected at any point, supervision and safe proof recommendations should be instituted.  MEDICATION SUPERVISION: Inability to self-administer medication needs to be constantly addressed. Implement a  mechanism to ensure safe administration of the medications.  RECOMMENDATIONS FOR ALL PATIENTS WITH MEMORY PROBLEMS: 1. Continue to exercise (Recommend 30 minutes of walking everyday, or 3 hours every week) 2. Increase social interactions - continue going to [place] and enjoy social gatherings with friends and family 3. Eat healthy, avoid fried foods and eat more fruits and vegetables 4. Maintain adequate blood pressure, blood sugar, and blood cholesterol level. Reducing the risk of stroke and cardiovascular disease also helps promoting better memory. 5. Avoid stressful situations. Live a simple life and avoid aggravations. Organize your time and prepare for the next day in anticipation. 6. Sleep well, avoid any interruptions of sleep and avoid any distractions in the bedroom that may interfere with adequate sleep quality 7. Avoid sugar, avoid sweets as there is a strong link between excessive sugar intake, diabetes, and cognitive impairment We discussed the Mediterranean diet, which has been shown to help patients reduce the risk of progressive memory disorders and reduces cardiovascular risk. This includes eating fish, eat fruits and green leafy vegetables, nuts like almonds and hazelnuts, walnuts, and also use olive oil. Avoid fast foods and fried foods as much as possible. Avoid sweets and sugar as sugar use has been linked to worsening of memory function.  There is always a concern of gradual progression of memory problems. If this is the case, then we may need to adjust level of care according to patient needs. Support, both to the patient and caregiver, should then be put into place.      Mediterranean Diet  Why follow it? Research shows Those who follow the Mediterranean diet have a reduced risk of heart disease  The diet  is associated with a reduced incidence of Parkinson's and Alzheimer's diseases People following the diet may have longer life expectancies and lower rates of chronic  diseases  The Dietary Guidelines for Americans recommends the Mediterranean diet as an eating plan to promote health and prevent disease  What Is the Mediterranean Diet?  Healthy eating plan based on typical foods and recipes of Mediterranean-style cooking The diet is primarily a plant based diet; these foods should make up a majority of meals   Starches - Plant based foods should make up a majority of meals - They are an important sources of vitamins, minerals, energy, antioxidants, and fiber - Choose whole grains, foods high in fiber and minimally processed items  - Typical grain sources include wheat, oats, barley, corn, brown rice, bulgar, farro, millet, polenta, couscous  - Various types of beans include chickpeas, lentils, fava beans, black beans, white beans   Fruits  Veggies - Large quantities of antioxidant rich fruits & veggies; 6 or more servings  - Vegetables can be eaten raw or lightly drizzled with oil and cooked  - Vegetables common to the traditional Mediterranean Diet include: artichokes, arugula, beets, broccoli, brussel sprouts, cabbage, carrots, celery, collard greens, cucumbers, eggplant, kale, leeks, lemons, lettuce, mushrooms, okra, onions, peas, peppers, potatoes, pumpkin, radishes, rutabaga, shallots, spinach, sweet potatoes, turnips, zucchini - Fruits common to the Mediterranean Diet include: apples, apricots, avocados, cherries, clementines, dates, figs, grapefruits, grapes, melons, nectarines, oranges, peaches, pears, pomegranates, strawberries, tangerines  Fats - Replace butter and margarine with healthy oils, such as olive oil, canola oil, and tahini  - Limit nuts to no more than a handful a day  - Nuts include walnuts, almonds, pecans, pistachios, pine nuts  - Limit or avoid candied, honey roasted or heavily salted nuts - Olives are central to the [employer] - can be eaten whole or used in a variety of dishes   Meats Protein - Limiting red meat: no more  than a few times a month - When eating red meat: choose lean cuts and keep the portion to the size of deck of cards - Eggs: approx. 0 to 4 times a week  - Fish and lean poultry: at least 2 a week  - Healthy protein sources include, chicken, [place], lean beef, lamb - Increase intake of seafood such as tuna, salmon, trout, mackerel, shrimp, scallops - Avoid or limit high fat processed meats such as sausage and bacon  Dairy - Include moderate amounts of low fat dairy products  - Focus on healthy dairy such as fat free yogurt, skim milk, low or reduced fat cheese - Limit dairy products higher in fat such as whole or 2% milk, cheese, ice cream  Alcohol - Moderate amounts of red wine is ok  - No more than 5 oz daily for women (all ages) and men older than age 52  - No more than 10 oz of wine daily for men younger than 36  Other - Limit sweets and other desserts  - Use herbs and spices instead of salt to flavor foods  - Herbs and spices common to the traditional Mediterranean Diet include: basil, bay leaves, chives, cloves, cumin, fennel, garlic, lavender, marjoram, mint, oregano, parsley, pepper, rosemary, sage, savory, sumac, tarragon, thyme   Its not just a diet, its a lifestyle:  The Mediterranean diet includes lifestyle factors typical of those in the region  Foods, drinks and meals are best eaten with others and savored Daily physical activity is important for overall  good health This could be strenuous exercise like running and aerobics This could also be more leisurely activities such as walking, housework, yard-work, or taking the stairs Moderation is the key; a balanced and healthy diet accommodates most foods and drinks Consider portion sizes and frequency of consumption of certain foods   Meal Ideas & Options:  Breakfast:  Whole wheat toast or whole wheat English muffins with peanut butter & hard boiled egg Steel cut oats topped with apples & cinnamon and skim milk  Fresh fruit:  banana, strawberries, melon, berries, peaches  Smoothies: strawberries, bananas, greek yogurt, peanut butter Low fat greek yogurt with blueberries and granola  Egg white omelet with spinach and mushrooms Breakfast couscous: whole wheat couscous, apricots, skim milk, cranberries  Sandwiches:  Hummus and grilled vegetables (peppers, zucchini, squash) on whole wheat bread   Grilled chicken on whole wheat pita with lettuce, tomatoes, cucumbers or tzatziki  [place] salad on whole wheat bread: tuna salad made with greek yogurt, olives, red peppers, capers, green onions Garlic rosemary lamb pita: lamb sauted with garlic, rosemary, salt & pepper; add lettuce, cucumber, greek yogurt to pita - flavor with lemon juice and black pepper  Seafood:  Mediterranean grilled salmon, seasoned with garlic, basil, parsley, lemon juice and black pepper Shrimp, lemon, and spinach whole-grain pasta salad made with low fat greek yogurt  Seared scallops with lemon orzo  Seared tuna steaks seasoned salt, pepper, coriander topped with tomato mixture of olives, tomatoes, olive oil, minced garlic, parsley, green onions and cappers  Meats:  Herbed greek chicken salad with kalamata olives, cucumber, feta  Red bell peppers stuffed with spinach, bulgur, lean ground beef (or lentils) & topped with feta   Kebabs: skewers of chicken, tomatoes, onions, zucchini, squash  [place] burgers: made with red onions, mint, dill, lemon juice, feta cheese topped with roasted red peppers Vegetarian Cucumber salad: cucumbers, artichoke hearts, celery, red onion, feta cheese, tossed in olive oil & lemon juice  Hummus and whole grain pita points with a greek salad (lettuce, tomato, feta, olives, cucumbers, red onion) Lentil soup with celery, carrots made with vegetable broth, garlic, salt and pepper  Tabouli salad: parsley, bulgur, mint, scallions, cucumbers, tomato, radishes, lemon juice, olive oil, salt and pepper.

## 2022-03-06 NOTE — Progress Notes (Signed)
NEUROLOGY FOLLOW UP OFFICE NOTE  Kayla Carrillo [id number] 10-Jan-1936  HISTORY OF PRESENT ILLNESS:  I had the pleasure of seeing Kayla Carrillo in follow-up in the neurology clinic on  03/06/2022. She is a very pleasant 86 year old RH woman with a history of hyperlipidemia, CHF, Takotsubo cardiomyopathy, depression, urinary incontinence, with mild dementia, likely mixed vascular and AD. No clinical signs of LBD. MRI brain showed extensive chronic microvascular disease. There was note of chronic degenerative arthropathy at the C1-2 articulation. Neuropsychological evaluation in 03/2021 indicated Major Neurocognitive Disorder (ie dementia). Etiology likely mixed vascular and Alzheimer's. Concern for LBD also raised, however she does not have any other clinical features of LBD. She is on Memantine 10mg  BID She was last seen in our office on 09/11/2021.  She is accompanied by her daughter who helps supplement the history today.  She feels her memory is pretty good, but sometimes "I forget people's name ". Her son says she is doing okay, "not seeing her getting worse." She is in [facility] at [facility]. [facility] fixes her pillbox and staff comes twice a day to administer her medications. She is not driving. Family manages finances. She can get in the shower but most mornings staff is on standby. She can do almost everything with dressing but needs help pulling pants back up. She has been doing physical therapy due to falls/equilibrium off, and her son feels she is a little bit better mobility-wise.she uses a walker to ambulate.  Denies any recent falls or head injuries.  "I really stiff because of the arthritis ".  She denies any headaches, dizziness, vision changes. She had some dysuria and takes "some bladder medication" she usually gets 8 hours of sleep "that I have strange dreams ", no hallucinations or paranoia.  Her daughter states that she has a "on and off boyfriend that helps her ".  She  likes to watch TV, but because of muscular degeneration sometimes there is difficulty with this. She states she gets "the blues a little bit, sometimes I could use a boost,", "today she is depressed ".  Her appetite is fair, denies drinking enough water, which led her to a recent UTI.  She likes to eat "junk ".  No headaches, dysarthria, dysphagia, back pain, or tremors.  History on Initial Assessment 01/30/2021: This is a pleasant 86 year old right-handed woman with a history of hyperlipidemia, CHF,Takotsubo cardiomyopathy, depression, urinary incontinence, presenting for evaluation of memory loss. Her ex-daughter-in-law Kayla Carrillo is present to provide additional information. When asked about her memory, she states that she cannot remember names, making her mad at herself. She has word-finding difficulties also noted on today's visit. Kayla Carrillo started noticing changes in 2019 while she was living in [place]. There was a lot of stress at that time, she was in the middle of a divorce at age 26 and had to sell her house. She moved in with her ex-son-in-law in [place] but had stress when her daughter also came to stay. She moved to [facility] 6 months ago, there is less stress, however Kayla Carrillo continues to note cognitive issues. She has gotten lost driving and called [place] [facility] 3 times in the past 2 years. She got lost driving to her great grandson's birthday 7 months ago. The last time she drove in September 2021 she was on the wrong side of the road. She forgets her medications, Kayla Carr[place] started fixing her pillbox and noticed this morning that she took her night medications in the morning yesterday.  Most of her bills are on autopay. She does not cook. She states she gets depressed and that maybe her Zoloft helps. No paranoia or hallucinations.   She has bladder and bowel incontinence, stating she can "pee up a storm." She wears adult diapers. She washes her sheets daily due to the incontinence, no issues  using the washing machine. She has dizziness when standing. She has had a lot of falls, she had a fall in 09/2020 and admitted for left elbow and right humerus fracture s/p ORIF in 09/2020. At that time she had a head CT without contrast which I personally reviewed, no acute changes, there was mild diffuse atrophy and moderate chronic microvascular disease. She has occasional double vision. Her neck is stiff sometimes. She has numbness in her right hand and soles of both feet. She states she had lost her sense of smell after taking cough medication years ago. No headaches, dysarthria/dysphagia, back pain, tremors. No family history of dementia. She has had falls where she hit her head, no loss of consciousness. She rarely drinks alcohol.   Laboratory Data: 07/2020: TSH 1.83, B12 330  PAST MEDICAL HISTORY: Past Medical History:  Diagnosis Date   Abnormal EKG 02/16/2019   Acute lower GI bleeding 04/06/2018   Cataract, left eye 11/02/2013   Chronic systolic heart failure    Closed displaced transcondylar fracture of right humerus 10/21/2020   sustained in a mechanical fall; ORIF surgical repair 10/23/2020   Closed olecranon fracture, left 10/21/2020   mechanical fall; ORIF 10/23/2020   Cystoid macular edema of right eye 04/25/2020   OD with minor CME nasal to the foveal avascular zone this is a surrogate for active CNVM OD   Diverticulosis of colon with hemorrhage 04/06/2018   Essential hypertension 10/21/2020   Exudative age-related macular degeneration of right eye with active choroidal neovascularization 04/25/2020   Gastroesophageal reflux disease    Hyperlipidemia 10/10/2010   Intermediate stage nonexudative age-related macular degeneration of left eye 04/25/2020   Leukocytosis 04/06/2018   White blood count varied from 11,600 up to 12,600 without left shift.  No signs of infection present.  Leukocytosis is attributed to stress of the fall, fractures and surgery.   Localized osteoarthritis  of lower leg 04/16/2011   right knee   LVH (left ventricular hypertrophy) 12/10/2020   Major depressive disorder    Obstructive sleep apnea 04/09/2011   Not using CPAP   Osteoarthritis of shoulder region 11/12/2010   Pure hypercholesterolemia    Stroke    02/17/2021 brain MRI - A few old small vessel cerebellar infarctions were noted, as well as small vessel infarctions in the thalami and basal ganglia.    Takotsubo cardiomyopathy 06/17/2013   Urinary incontinence    Vascular dementia 04/10/2021    MEDICATIONS: Current Outpatient Medications on File Prior to Visit  Medication Sig Dispense Refill   carvedilol (COREG) 6.25 MG tablet Take 1 tablet (6.25 mg total) by mouth 2 (two) times daily with a meal. 60 tablet 0   cholecalciferol (VITAMIN D) 25 MCG tablet Take 2 tablets (2,000 Units total) by mouth daily.     hydrALAZINE (APRESOLINE) 25 MG tablet Take 1 tablet (25 mg total) by mouth in the morning and at bedtime. 180 tablet 3   memantine (NAMENDA) 10 MG tablet Take 1 tablet twice a day 180 tablet 3   omeprazole (PRILOSEC) 20 MG capsule Take 1 capsule (20 mg total) by mouth daily. 30 capsule 0   oxybutynin (DITROPAN-XL) 10 MG 24 hr  tablet Take 1 tablet (10 mg total) by mouth daily. 30 tablet 0   sertraline (ZOLOFT) 50 MG tablet Take 1.5 tablets (75 mg total) by mouth daily. Give 1.5 tablets to = 75 mg 45 tablet 0   simvastatin (ZOCOR) 40 MG tablet Take 1 tablet (40 mg total) by mouth daily. 30 tablet 0   tolterodine (DETROL LA) 4 MG 24 hr capsule Take 4 mg by mouth daily.     Ensure (ENSURE) Take 237 mLs by mouth 2 (two) times daily between meals. To prevent malnutrition (Patient not taking: Reported on 08/31/2021)     nitrofurantoin (MACRODANTIN) 100 MG capsule Take 1 capsule (100 mg total) by mouth at bedtime. (Patient not taking: Reported on 03/06/2022) 30 capsule 0   No current facility-administered medications on file prior to visit.    ALLERGIES: Allergies  Allergen Reactions    Codeine Sulfate [Codeine] Other (See Comments)    drowsey   Sulfa Antibiotics Other (See Comments)    drowsey    FAMILY HISTORY: Family History  Problem Relation Age of Onset   Bipolar disorder Daughter     SOCIAL HISTORY: Social History   Socioeconomic History   Marital status: Single    Spouse name: Not on file   Number of children: Not on file   Years of education: 12   Highest education level: High school graduate  Occupational History   Occupation: Retired  Tobacco Use   Smoking status: Never   Smokeless tobacco: Never  Vaping Use   Vaping Use: Never used  Substance and Sexual Activity   Alcohol use: Not Currently    Comment: rare   Drug use: Never   Sexual activity: Not on file  Other Topics Concern   Not on file  Social History Narrative   Four children. Husband lives with [place] with his daughters.     Right handed    Lives alone at home   Social Determinants of Health   Financial Resource Strain: Not on file  Food Insecurity: Not on file  Transportation Needs: Not on file  Physical Activity: Not on file  Stress: Not on file  Social Connections: Not on file  Intimate Partner Violence: Not on file     PHYSICAL EXAM: Vitals:   03/06/22 1114  BP: (!) 141/87  Pulse: 96  Resp: 20    General: No acute distress Head:  Normocephalic/atraumatic Skin/Extremities: No rash, no edema Neurological Exam: alert and oriented to person, place, and time. No aphasia or dysarthria. Fund of knowledge is appropriate.  Recent and remote memory are intact.  Attention and concentration are normal.   Cranial nerves: Pupils equal, round. Extraocular movements intact with no nystagmus. Visual fields full.  No facial asymmetry.  Motor: Bulk and tone normal, muscle strength 5/5 throughout with no pronator drift.   Finger to nose testing intact.  Gait narrow-based and steady, able to tandem walk adequately.  Romberg negative.  Montreal Cognitive Assessment  01/30/2021   Visuospatial/ Executive (0/5) 0  Naming (0/3) 3  Attention: Read list of digits (0/2) 1  Attention: Read list of letters (0/1) 1  Attention: Serial 7 subtraction starting at 100 (0/3) 1  Language: Repeat phrase (0/2) 0  Language : Fluency (0/1) 0  Abstraction (0/2) 2  Delayed Recall (0/5) 1  Orientation (0/6) 4  Total 13  Adjusted Score (based on education) 13      IMPRESSION: This is a pleasant 86 year old right-handed woman, symptoms overall stable, no behavioral changes.  She was encouraged to join activities at her facility, continue regular exercise. Continue control of vascular risk factors. Continue close supervision. She does not drive. Follow-up in 6 months, they know to call for any changes.   Thank you for allowing me to participate in her care.  Please do not hesitate to call for any questions or concerns.    Patrcia Dolly, M.D.   CC: Dr. Docia Chuck

## 2022-03-07 DIAGNOSIS — R262 Difficulty in walking, not elsewhere classified: Secondary | ICD-10-CM | POA: Diagnosis not present

## 2022-03-07 DIAGNOSIS — M25511 Pain in right shoulder: Secondary | ICD-10-CM | POA: Diagnosis not present

## 2022-03-07 DIAGNOSIS — M6281 Muscle weakness (generalized): Secondary | ICD-10-CM | POA: Diagnosis not present

## 2022-03-07 DIAGNOSIS — N3946 Mixed incontinence: Secondary | ICD-10-CM | POA: Diagnosis not present

## 2022-03-07 DIAGNOSIS — R296 Repeated falls: Secondary | ICD-10-CM | POA: Diagnosis not present

## 2022-03-07 DIAGNOSIS — M25512 Pain in left shoulder: Secondary | ICD-10-CM | POA: Diagnosis not present

## 2022-03-12 ENCOUNTER — Other Ambulatory Visit: Payer: Self-pay | Admitting: Cardiology

## 2022-03-12 DIAGNOSIS — N3946 Mixed incontinence: Secondary | ICD-10-CM | POA: Diagnosis not present

## 2022-03-12 DIAGNOSIS — I10 Essential (primary) hypertension: Secondary | ICD-10-CM

## 2022-03-12 DIAGNOSIS — M25512 Pain in left shoulder: Secondary | ICD-10-CM | POA: Diagnosis not present

## 2022-03-12 DIAGNOSIS — R262 Difficulty in walking, not elsewhere classified: Secondary | ICD-10-CM | POA: Diagnosis not present

## 2022-03-12 DIAGNOSIS — R296 Repeated falls: Secondary | ICD-10-CM | POA: Diagnosis not present

## 2022-03-12 DIAGNOSIS — M25511 Pain in right shoulder: Secondary | ICD-10-CM | POA: Diagnosis not present

## 2022-03-12 DIAGNOSIS — M6281 Muscle weakness (generalized): Secondary | ICD-10-CM | POA: Diagnosis not present

## 2022-03-14 DIAGNOSIS — M25511 Pain in right shoulder: Secondary | ICD-10-CM | POA: Diagnosis not present

## 2022-03-14 DIAGNOSIS — R296 Repeated falls: Secondary | ICD-10-CM | POA: Diagnosis not present

## 2022-03-14 DIAGNOSIS — R262 Difficulty in walking, not elsewhere classified: Secondary | ICD-10-CM | POA: Diagnosis not present

## 2022-03-14 DIAGNOSIS — M25512 Pain in left shoulder: Secondary | ICD-10-CM | POA: Diagnosis not present

## 2022-03-14 DIAGNOSIS — N3946 Mixed incontinence: Secondary | ICD-10-CM | POA: Diagnosis not present

## 2022-03-14 DIAGNOSIS — M6281 Muscle weakness (generalized): Secondary | ICD-10-CM | POA: Diagnosis not present

## 2022-03-15 DIAGNOSIS — R262 Difficulty in walking, not elsewhere classified: Secondary | ICD-10-CM | POA: Diagnosis not present

## 2022-03-15 DIAGNOSIS — M6281 Muscle weakness (generalized): Secondary | ICD-10-CM | POA: Diagnosis not present

## 2022-03-15 DIAGNOSIS — R296 Repeated falls: Secondary | ICD-10-CM | POA: Diagnosis not present

## 2022-03-15 DIAGNOSIS — M25512 Pain in left shoulder: Secondary | ICD-10-CM | POA: Diagnosis not present

## 2022-03-15 DIAGNOSIS — M25511 Pain in right shoulder: Secondary | ICD-10-CM | POA: Diagnosis not present

## 2022-03-15 DIAGNOSIS — N3946 Mixed incontinence: Secondary | ICD-10-CM | POA: Diagnosis not present

## 2022-03-19 DIAGNOSIS — M6281 Muscle weakness (generalized): Secondary | ICD-10-CM | POA: Diagnosis not present

## 2022-03-19 DIAGNOSIS — R262 Difficulty in walking, not elsewhere classified: Secondary | ICD-10-CM | POA: Diagnosis not present

## 2022-03-19 DIAGNOSIS — N3946 Mixed incontinence: Secondary | ICD-10-CM | POA: Diagnosis not present

## 2022-03-19 DIAGNOSIS — M25512 Pain in left shoulder: Secondary | ICD-10-CM | POA: Diagnosis not present

## 2022-03-19 DIAGNOSIS — M25511 Pain in right shoulder: Secondary | ICD-10-CM | POA: Diagnosis not present

## 2022-03-19 DIAGNOSIS — R296 Repeated falls: Secondary | ICD-10-CM | POA: Diagnosis not present

## 2022-03-21 DIAGNOSIS — M25511 Pain in right shoulder: Secondary | ICD-10-CM | POA: Diagnosis not present

## 2022-03-21 DIAGNOSIS — H353211 Exudative age-related macular degeneration, right eye, with active choroidal neovascularization: Secondary | ICD-10-CM | POA: Diagnosis not present

## 2022-03-21 DIAGNOSIS — M6281 Muscle weakness (generalized): Secondary | ICD-10-CM | POA: Diagnosis not present

## 2022-03-21 DIAGNOSIS — M25512 Pain in left shoulder: Secondary | ICD-10-CM | POA: Diagnosis not present

## 2022-03-21 DIAGNOSIS — R262 Difficulty in walking, not elsewhere classified: Secondary | ICD-10-CM | POA: Diagnosis not present

## 2022-03-21 DIAGNOSIS — N3946 Mixed incontinence: Secondary | ICD-10-CM | POA: Diagnosis not present

## 2022-03-21 DIAGNOSIS — R296 Repeated falls: Secondary | ICD-10-CM | POA: Diagnosis not present

## 2022-03-23 DIAGNOSIS — Z79899 Other long term (current) drug therapy: Secondary | ICD-10-CM | POA: Diagnosis not present

## 2022-03-23 DIAGNOSIS — I447 Left bundle-branch block, unspecified: Secondary | ICD-10-CM | POA: Diagnosis not present

## 2022-03-23 DIAGNOSIS — M79643 Pain in unspecified hand: Secondary | ICD-10-CM | POA: Diagnosis not present

## 2022-03-23 DIAGNOSIS — R1312 Dysphagia, oropharyngeal phase: Secondary | ICD-10-CM | POA: Diagnosis not present

## 2022-03-23 DIAGNOSIS — R053 Chronic cough: Secondary | ICD-10-CM | POA: Diagnosis not present

## 2022-03-23 DIAGNOSIS — G309 Alzheimer's disease, unspecified: Secondary | ICD-10-CM | POA: Diagnosis not present

## 2022-03-23 DIAGNOSIS — Z9181 History of falling: Secondary | ICD-10-CM | POA: Diagnosis not present

## 2022-03-23 DIAGNOSIS — M79631 Pain in right forearm: Secondary | ICD-10-CM | POA: Diagnosis not present

## 2022-03-23 DIAGNOSIS — R278 Other lack of coordination: Secondary | ICD-10-CM | POA: Diagnosis not present

## 2022-03-23 DIAGNOSIS — K219 Gastro-esophageal reflux disease without esophagitis: Secondary | ICD-10-CM | POA: Diagnosis not present

## 2022-03-23 DIAGNOSIS — M47812 Spondylosis without myelopathy or radiculopathy, cervical region: Secondary | ICD-10-CM | POA: Diagnosis not present

## 2022-03-23 DIAGNOSIS — R410 Disorientation, unspecified: Secondary | ICD-10-CM | POA: Diagnosis not present

## 2022-03-23 DIAGNOSIS — N390 Urinary tract infection, site not specified: Secondary | ICD-10-CM | POA: Diagnosis not present

## 2022-03-23 DIAGNOSIS — R2681 Unsteadiness on feet: Secondary | ICD-10-CM | POA: Diagnosis not present

## 2022-03-23 DIAGNOSIS — I6782 Cerebral ischemia: Secondary | ICD-10-CM | POA: Diagnosis not present

## 2022-03-23 DIAGNOSIS — F32A Depression, unspecified: Secondary | ICD-10-CM | POA: Diagnosis not present

## 2022-03-23 DIAGNOSIS — S0990XA Unspecified injury of head, initial encounter: Secondary | ICD-10-CM | POA: Diagnosis not present

## 2022-03-23 DIAGNOSIS — E611 Iron deficiency: Secondary | ICD-10-CM | POA: Diagnosis not present

## 2022-03-23 DIAGNOSIS — R231 Pallor: Secondary | ICD-10-CM | POA: Diagnosis not present

## 2022-03-23 DIAGNOSIS — M542 Cervicalgia: Secondary | ICD-10-CM | POA: Diagnosis not present

## 2022-03-23 DIAGNOSIS — I10 Essential (primary) hypertension: Secondary | ICD-10-CM | POA: Diagnosis not present

## 2022-03-23 DIAGNOSIS — R54 Age-related physical debility: Secondary | ICD-10-CM | POA: Diagnosis not present

## 2022-03-23 DIAGNOSIS — F0390 Unspecified dementia without behavioral disturbance: Secondary | ICD-10-CM | POA: Diagnosis not present

## 2022-03-23 DIAGNOSIS — M19041 Primary osteoarthritis, right hand: Secondary | ICD-10-CM | POA: Diagnosis not present

## 2022-03-23 DIAGNOSIS — S199XXA Unspecified injury of neck, initial encounter: Secondary | ICD-10-CM | POA: Diagnosis not present

## 2022-03-23 DIAGNOSIS — M25531 Pain in right wrist: Secondary | ICD-10-CM | POA: Diagnosis not present

## 2022-03-23 DIAGNOSIS — G319 Degenerative disease of nervous system, unspecified: Secondary | ICD-10-CM | POA: Diagnosis not present

## 2022-03-23 DIAGNOSIS — R269 Unspecified abnormalities of gait and mobility: Secondary | ICD-10-CM | POA: Diagnosis not present

## 2022-03-23 DIAGNOSIS — R296 Repeated falls: Secondary | ICD-10-CM | POA: Diagnosis not present

## 2022-03-23 DIAGNOSIS — I493 Ventricular premature depolarization: Secondary | ICD-10-CM | POA: Diagnosis not present

## 2022-03-23 DIAGNOSIS — M6282 Rhabdomyolysis: Secondary | ICD-10-CM | POA: Diagnosis not present

## 2022-03-23 DIAGNOSIS — Z7409 Other reduced mobility: Secondary | ICD-10-CM | POA: Diagnosis not present

## 2022-03-23 DIAGNOSIS — E538 Deficiency of other specified B group vitamins: Secondary | ICD-10-CM | POA: Diagnosis not present

## 2022-03-23 DIAGNOSIS — M8580 Other specified disorders of bone density and structure, unspecified site: Secondary | ICD-10-CM | POA: Diagnosis not present

## 2022-03-23 DIAGNOSIS — M79641 Pain in right hand: Secondary | ICD-10-CM | POA: Diagnosis not present

## 2022-03-23 DIAGNOSIS — E559 Vitamin D deficiency, unspecified: Secondary | ICD-10-CM | POA: Diagnosis not present

## 2022-03-23 DIAGNOSIS — K5792 Diverticulitis of intestine, part unspecified, without perforation or abscess without bleeding: Secondary | ICD-10-CM | POA: Diagnosis not present

## 2022-03-23 DIAGNOSIS — M6281 Muscle weakness (generalized): Secondary | ICD-10-CM | POA: Diagnosis not present

## 2022-03-23 DIAGNOSIS — D509 Iron deficiency anemia, unspecified: Secondary | ICD-10-CM | POA: Diagnosis not present

## 2022-03-27 DIAGNOSIS — R053 Chronic cough: Secondary | ICD-10-CM | POA: Diagnosis not present

## 2022-03-27 DIAGNOSIS — M25531 Pain in right wrist: Secondary | ICD-10-CM | POA: Diagnosis not present

## 2022-03-27 DIAGNOSIS — R54 Age-related physical debility: Secondary | ICD-10-CM | POA: Diagnosis not present

## 2022-03-27 DIAGNOSIS — R42 Dizziness and giddiness: Secondary | ICD-10-CM | POA: Diagnosis not present

## 2022-03-27 DIAGNOSIS — I10 Essential (primary) hypertension: Secondary | ICD-10-CM | POA: Diagnosis not present

## 2022-03-27 DIAGNOSIS — D72829 Elevated white blood cell count, unspecified: Secondary | ICD-10-CM | POA: Diagnosis not present

## 2022-03-27 DIAGNOSIS — M79641 Pain in right hand: Secondary | ICD-10-CM | POA: Diagnosis not present

## 2022-03-27 DIAGNOSIS — G309 Alzheimer's disease, unspecified: Secondary | ICD-10-CM | POA: Diagnosis not present

## 2022-03-27 DIAGNOSIS — R1312 Dysphagia, oropharyngeal phase: Secondary | ICD-10-CM | POA: Diagnosis not present

## 2022-03-27 DIAGNOSIS — Z299 Encounter for prophylactic measures, unspecified: Secondary | ICD-10-CM | POA: Diagnosis not present

## 2022-03-27 DIAGNOSIS — Z23 Encounter for immunization: Secondary | ICD-10-CM | POA: Diagnosis not present

## 2022-03-27 DIAGNOSIS — E785 Hyperlipidemia, unspecified: Secondary | ICD-10-CM | POA: Diagnosis not present

## 2022-03-27 DIAGNOSIS — R946 Abnormal results of thyroid function studies: Secondary | ICD-10-CM | POA: Diagnosis not present

## 2022-03-27 DIAGNOSIS — R627 Adult failure to thrive: Secondary | ICD-10-CM | POA: Diagnosis not present

## 2022-03-27 DIAGNOSIS — K5792 Diverticulitis of intestine, part unspecified, without perforation or abscess without bleeding: Secondary | ICD-10-CM | POA: Diagnosis not present

## 2022-03-27 DIAGNOSIS — Z9181 History of falling: Secondary | ICD-10-CM | POA: Diagnosis not present

## 2022-03-27 DIAGNOSIS — R296 Repeated falls: Secondary | ICD-10-CM | POA: Diagnosis not present

## 2022-03-27 DIAGNOSIS — M6281 Muscle weakness (generalized): Secondary | ICD-10-CM | POA: Diagnosis not present

## 2022-03-27 DIAGNOSIS — E559 Vitamin D deficiency, unspecified: Secondary | ICD-10-CM | POA: Diagnosis not present

## 2022-03-27 DIAGNOSIS — M6282 Rhabdomyolysis: Secondary | ICD-10-CM | POA: Diagnosis not present

## 2022-03-27 DIAGNOSIS — J40 Bronchitis, not specified as acute or chronic: Secondary | ICD-10-CM | POA: Diagnosis not present

## 2022-03-27 DIAGNOSIS — R278 Other lack of coordination: Secondary | ICD-10-CM | POA: Diagnosis not present

## 2022-03-27 DIAGNOSIS — R2681 Unsteadiness on feet: Secondary | ICD-10-CM | POA: Diagnosis not present

## 2022-03-27 DIAGNOSIS — D509 Iron deficiency anemia, unspecified: Secondary | ICD-10-CM | POA: Diagnosis not present

## 2022-03-27 DIAGNOSIS — F0390 Unspecified dementia without behavioral disturbance: Secondary | ICD-10-CM | POA: Diagnosis not present

## 2022-03-27 DIAGNOSIS — K219 Gastro-esophageal reflux disease without esophagitis: Secondary | ICD-10-CM | POA: Diagnosis not present

## 2022-03-27 DIAGNOSIS — L89610 Pressure ulcer of right heel, unstageable: Secondary | ICD-10-CM | POA: Diagnosis not present

## 2022-03-27 DIAGNOSIS — E538 Deficiency of other specified B group vitamins: Secondary | ICD-10-CM | POA: Diagnosis not present

## 2022-03-27 DIAGNOSIS — N390 Urinary tract infection, site not specified: Secondary | ICD-10-CM | POA: Diagnosis not present

## 2022-03-27 DIAGNOSIS — Z7409 Other reduced mobility: Secondary | ICD-10-CM | POA: Diagnosis not present

## 2022-03-27 DIAGNOSIS — E611 Iron deficiency: Secondary | ICD-10-CM | POA: Diagnosis not present

## 2022-03-27 DIAGNOSIS — M19041 Primary osteoarthritis, right hand: Secondary | ICD-10-CM | POA: Diagnosis not present

## 2022-03-30 DIAGNOSIS — N390 Urinary tract infection, site not specified: Secondary | ICD-10-CM | POA: Diagnosis not present

## 2022-03-30 DIAGNOSIS — R296 Repeated falls: Secondary | ICD-10-CM | POA: Diagnosis not present

## 2022-03-30 DIAGNOSIS — M6282 Rhabdomyolysis: Secondary | ICD-10-CM | POA: Diagnosis not present

## 2022-03-30 DIAGNOSIS — R627 Adult failure to thrive: Secondary | ICD-10-CM | POA: Diagnosis not present

## 2022-04-03 DIAGNOSIS — K219 Gastro-esophageal reflux disease without esophagitis: Secondary | ICD-10-CM | POA: Diagnosis not present

## 2022-04-03 DIAGNOSIS — G309 Alzheimer's disease, unspecified: Secondary | ICD-10-CM | POA: Diagnosis not present

## 2022-04-03 DIAGNOSIS — N390 Urinary tract infection, site not specified: Secondary | ICD-10-CM | POA: Diagnosis not present

## 2022-04-03 DIAGNOSIS — E611 Iron deficiency: Secondary | ICD-10-CM | POA: Diagnosis not present

## 2022-04-03 DIAGNOSIS — I10 Essential (primary) hypertension: Secondary | ICD-10-CM | POA: Diagnosis not present

## 2022-04-03 DIAGNOSIS — E538 Deficiency of other specified B group vitamins: Secondary | ICD-10-CM | POA: Diagnosis not present

## 2022-04-03 DIAGNOSIS — M6282 Rhabdomyolysis: Secondary | ICD-10-CM | POA: Diagnosis not present

## 2022-04-03 DIAGNOSIS — E785 Hyperlipidemia, unspecified: Secondary | ICD-10-CM | POA: Diagnosis not present

## 2022-04-03 DIAGNOSIS — E559 Vitamin D deficiency, unspecified: Secondary | ICD-10-CM | POA: Diagnosis not present

## 2022-04-19 DIAGNOSIS — E559 Vitamin D deficiency, unspecified: Secondary | ICD-10-CM | POA: Diagnosis not present

## 2022-04-19 DIAGNOSIS — I10 Essential (primary) hypertension: Secondary | ICD-10-CM | POA: Diagnosis not present

## 2022-04-19 DIAGNOSIS — N390 Urinary tract infection, site not specified: Secondary | ICD-10-CM | POA: Diagnosis not present

## 2022-04-19 DIAGNOSIS — M6282 Rhabdomyolysis: Secondary | ICD-10-CM | POA: Diagnosis not present

## 2022-04-19 DIAGNOSIS — R42 Dizziness and giddiness: Secondary | ICD-10-CM | POA: Diagnosis not present

## 2022-04-19 DIAGNOSIS — G309 Alzheimer's disease, unspecified: Secondary | ICD-10-CM | POA: Diagnosis not present

## 2022-04-19 DIAGNOSIS — E538 Deficiency of other specified B group vitamins: Secondary | ICD-10-CM | POA: Diagnosis not present

## 2022-04-22 DIAGNOSIS — L89610 Pressure ulcer of right heel, unstageable: Secondary | ICD-10-CM | POA: Diagnosis not present

## 2022-04-28 DIAGNOSIS — E785 Hyperlipidemia, unspecified: Secondary | ICD-10-CM | POA: Diagnosis not present

## 2022-04-28 DIAGNOSIS — G309 Alzheimer's disease, unspecified: Secondary | ICD-10-CM | POA: Diagnosis not present

## 2022-04-28 DIAGNOSIS — R296 Repeated falls: Secondary | ICD-10-CM | POA: Diagnosis not present

## 2022-04-28 DIAGNOSIS — I10 Essential (primary) hypertension: Secondary | ICD-10-CM | POA: Diagnosis not present

## 2022-04-29 DIAGNOSIS — L89610 Pressure ulcer of right heel, unstageable: Secondary | ICD-10-CM | POA: Diagnosis not present

## 2022-05-07 DIAGNOSIS — Z79899 Other long term (current) drug therapy: Secondary | ICD-10-CM | POA: Diagnosis not present

## 2022-05-07 DIAGNOSIS — E119 Type 2 diabetes mellitus without complications: Secondary | ICD-10-CM | POA: Diagnosis not present

## 2022-05-07 DIAGNOSIS — E559 Vitamin D deficiency, unspecified: Secondary | ICD-10-CM | POA: Diagnosis not present

## 2022-05-08 DIAGNOSIS — E119 Type 2 diabetes mellitus without complications: Secondary | ICD-10-CM | POA: Diagnosis not present

## 2022-05-10 DIAGNOSIS — Z79899 Other long term (current) drug therapy: Secondary | ICD-10-CM | POA: Diagnosis not present

## 2022-05-10 DIAGNOSIS — R109 Unspecified abdominal pain: Secondary | ICD-10-CM | POA: Diagnosis not present

## 2022-05-10 DIAGNOSIS — I10 Essential (primary) hypertension: Secondary | ICD-10-CM | POA: Diagnosis not present

## 2022-05-10 DIAGNOSIS — G309 Alzheimer's disease, unspecified: Secondary | ICD-10-CM | POA: Diagnosis not present

## 2022-05-10 DIAGNOSIS — W19XXXA Unspecified fall, initial encounter: Secondary | ICD-10-CM | POA: Diagnosis not present

## 2022-05-14 DIAGNOSIS — N390 Urinary tract infection, site not specified: Secondary | ICD-10-CM | POA: Diagnosis not present

## 2022-05-14 DIAGNOSIS — M6281 Muscle weakness (generalized): Secondary | ICD-10-CM | POA: Diagnosis not present

## 2022-05-14 DIAGNOSIS — G309 Alzheimer's disease, unspecified: Secondary | ICD-10-CM | POA: Diagnosis not present

## 2022-05-14 DIAGNOSIS — I10 Essential (primary) hypertension: Secondary | ICD-10-CM | POA: Diagnosis not present

## 2022-05-14 DIAGNOSIS — R278 Other lack of coordination: Secondary | ICD-10-CM | POA: Diagnosis not present

## 2022-05-14 DIAGNOSIS — R2681 Unsteadiness on feet: Secondary | ICD-10-CM | POA: Diagnosis not present

## 2022-05-15 DIAGNOSIS — L89616 Pressure-induced deep tissue damage of right heel: Secondary | ICD-10-CM | POA: Diagnosis not present

## 2022-05-22 DIAGNOSIS — L89616 Pressure-induced deep tissue damage of right heel: Secondary | ICD-10-CM | POA: Diagnosis not present

## 2022-05-29 DIAGNOSIS — L89616 Pressure-induced deep tissue damage of right heel: Secondary | ICD-10-CM | POA: Diagnosis not present

## 2022-06-01 DIAGNOSIS — E119 Type 2 diabetes mellitus without complications: Secondary | ICD-10-CM | POA: Diagnosis not present

## 2022-06-01 DIAGNOSIS — E559 Vitamin D deficiency, unspecified: Secondary | ICD-10-CM | POA: Diagnosis not present

## 2022-06-01 DIAGNOSIS — Z79899 Other long term (current) drug therapy: Secondary | ICD-10-CM | POA: Diagnosis not present

## 2022-06-03 DIAGNOSIS — I10 Essential (primary) hypertension: Secondary | ICD-10-CM | POA: Diagnosis not present

## 2022-06-03 DIAGNOSIS — Z9181 History of falling: Secondary | ICD-10-CM | POA: Diagnosis not present

## 2022-06-03 DIAGNOSIS — M6281 Muscle weakness (generalized): Secondary | ICD-10-CM | POA: Diagnosis not present

## 2022-06-03 DIAGNOSIS — R278 Other lack of coordination: Secondary | ICD-10-CM | POA: Diagnosis not present

## 2022-06-03 DIAGNOSIS — N390 Urinary tract infection, site not specified: Secondary | ICD-10-CM | POA: Diagnosis not present

## 2022-06-03 DIAGNOSIS — G309 Alzheimer's disease, unspecified: Secondary | ICD-10-CM | POA: Diagnosis not present

## 2022-06-03 DIAGNOSIS — R2681 Unsteadiness on feet: Secondary | ICD-10-CM | POA: Diagnosis not present

## 2022-06-05 DIAGNOSIS — H353211 Exudative age-related macular degeneration, right eye, with active choroidal neovascularization: Secondary | ICD-10-CM | POA: Diagnosis not present

## 2022-06-05 DIAGNOSIS — L89616 Pressure-induced deep tissue damage of right heel: Secondary | ICD-10-CM | POA: Diagnosis not present

## 2022-06-12 DIAGNOSIS — F5101 Primary insomnia: Secondary | ICD-10-CM | POA: Diagnosis not present

## 2022-06-12 DIAGNOSIS — F03B18 Unspecified dementia, moderate, with other behavioral disturbance: Secondary | ICD-10-CM | POA: Diagnosis not present

## 2022-06-12 DIAGNOSIS — L89616 Pressure-induced deep tissue damage of right heel: Secondary | ICD-10-CM | POA: Diagnosis not present

## 2022-06-12 DIAGNOSIS — G301 Alzheimer's disease with late onset: Secondary | ICD-10-CM | POA: Diagnosis not present

## 2022-06-19 DIAGNOSIS — L89613 Pressure ulcer of right heel, stage 3: Secondary | ICD-10-CM | POA: Diagnosis not present

## 2022-07-09 ENCOUNTER — Telehealth: Payer: Self-pay | Admitting: Cardiology

## 2022-07-09 NOTE — Telephone Encounter (Signed)
Spoke to [employer] (ok per DPR)-she states patient fell in march and really exacerbated her dementia.  She went to rehab and is now at [facility] nursing facility.  She states her dementia is very advanced and they try not to take her out if not necessary.  She is unable to stand or answer any questions, hallucinates, etc.   She states the doctor on staff is seeing patient and would like for them to continue to see her and consult with Dr. Antoine Poche if needed.   She doesn't think she is able to come to an appointment for follow up.       Advised would send to Dr. Antoine Poche to make aware.  Advised if MD on staff has any questions/concerns to reach out to discuss.

## 2022-07-09 NOTE — Telephone Encounter (Signed)
Called patient to schedule recall. Melody, patient's daughter in law, states she had received a call last month about discussing the patient's care at [facility]. She says the nursing home was supposed to be contacted by the office, but have not heard anything. She would like to know if the patient still needs to be seen by the office, because she had a fall and has not done well since.

## 2022-07-10 DIAGNOSIS — H353211 Exudative age-related macular degeneration, right eye, with active choroidal neovascularization: Secondary | ICD-10-CM | POA: Diagnosis not present

## 2022-07-23 IMAGING — CT CT HEAD W/O CM
3 series · 15 of 47 positions shown, 18 images · non-contrast
Comparison: None.

CLINICAL DATA: 84-year-old female with fall and head injury today.

EXAM:
CT HEAD WITHOUT CONTRAST
TECHNIQUE: Contiguous axial images were obtained from the base of the skull
through the vertex without intravenous contrast.

[Series 2: head wo · axial · 0.41mm/px · z∈[+1052,+1188]mm · 9 of 33 slices shown, 12 images]
[im 3/33  brain]
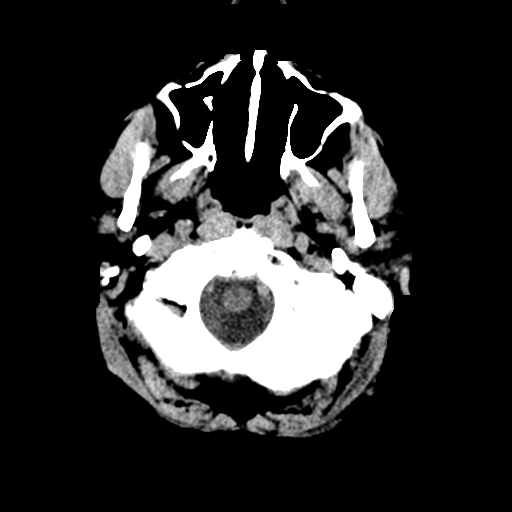
[im 3/33  bone]
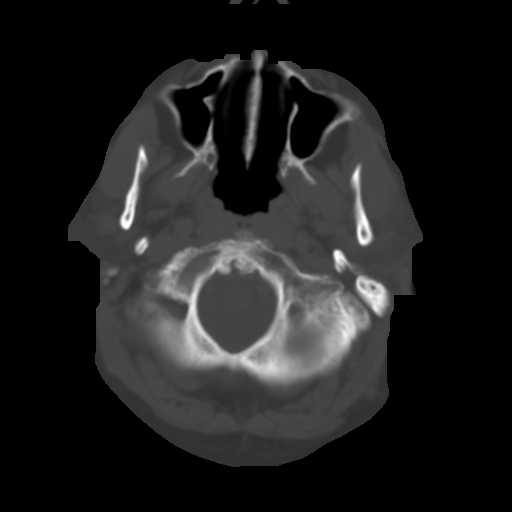
[im 6/33  brain]
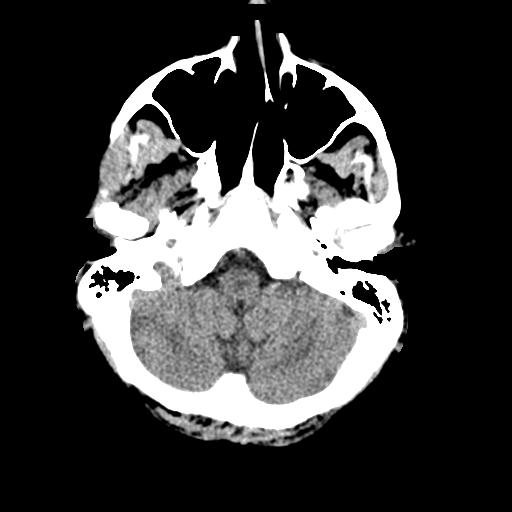
[im 9/33  brain]
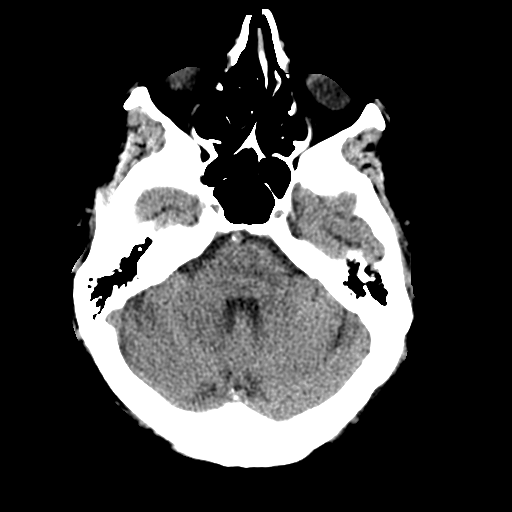
[im 13/33  brain]
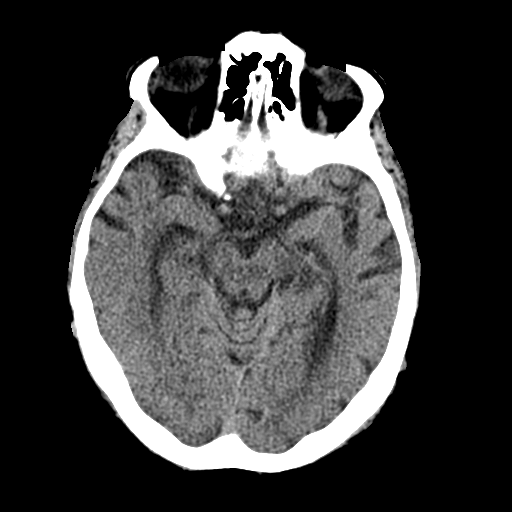
[im 17/33  brain]
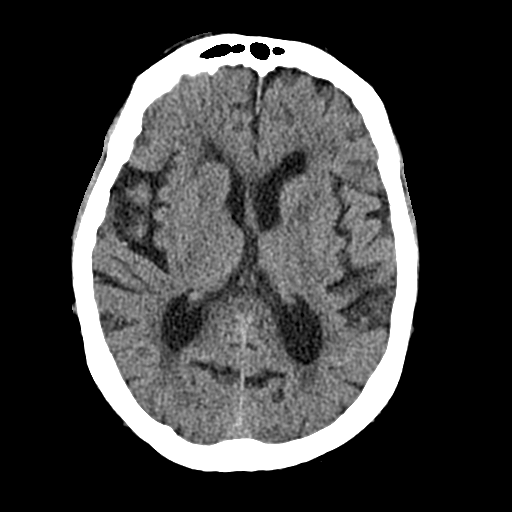
[im 17/33  bone]
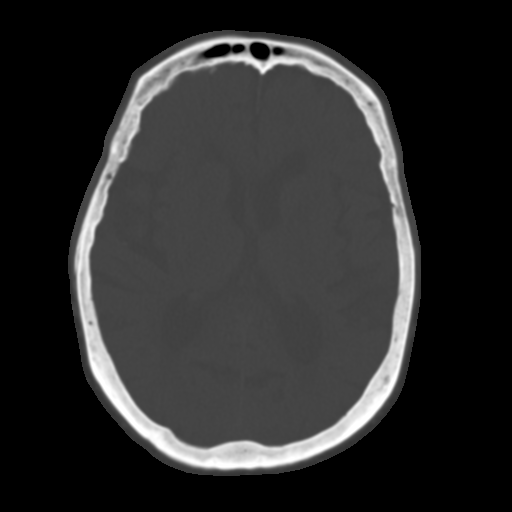
[im 20/33  brain]
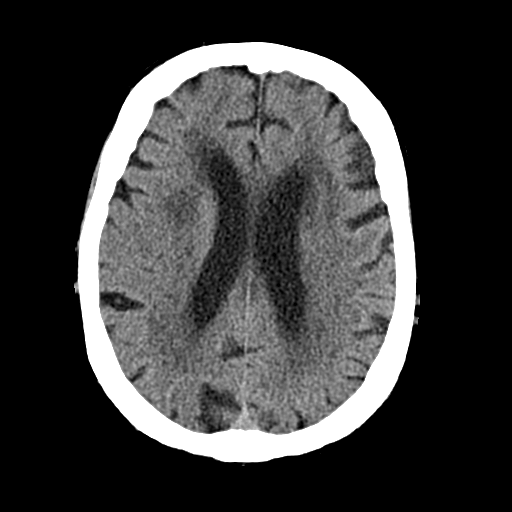
[im 24/33  brain]
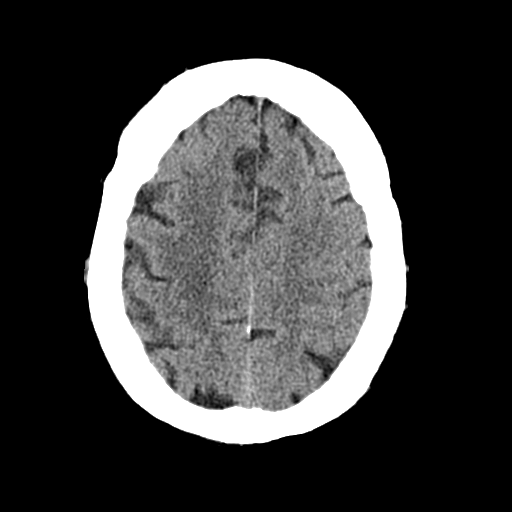
[im 27/33  brain]
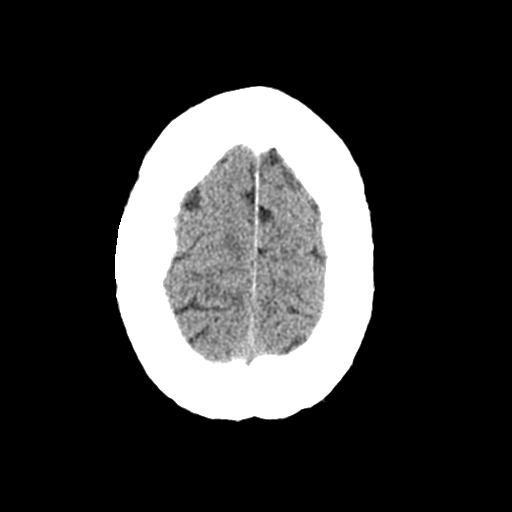
[im 30/33  brain]
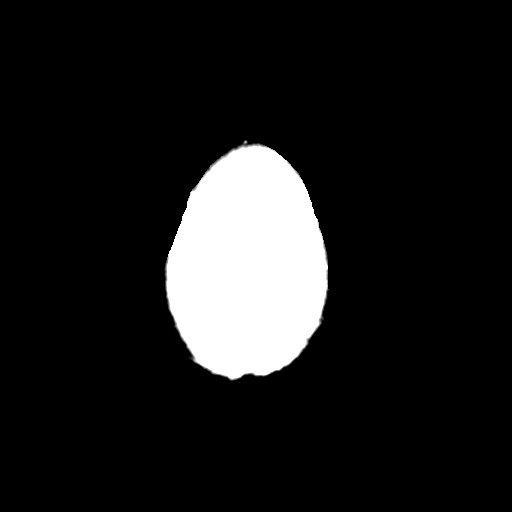
[im 30/33  bone]
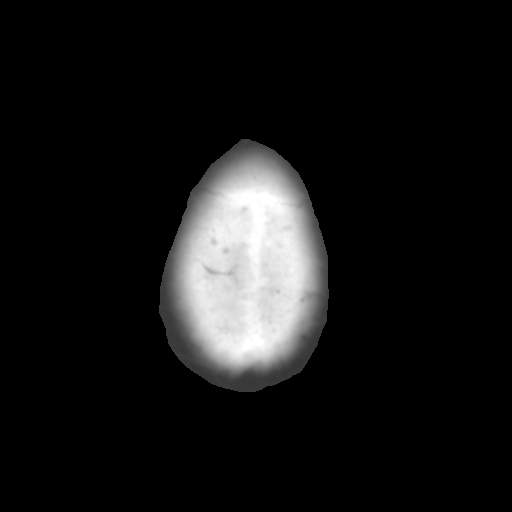

[Series 4: cor soft · coronal · 0.34mm/px · 3 of 70 slices shown]
[im 24/70  brain]
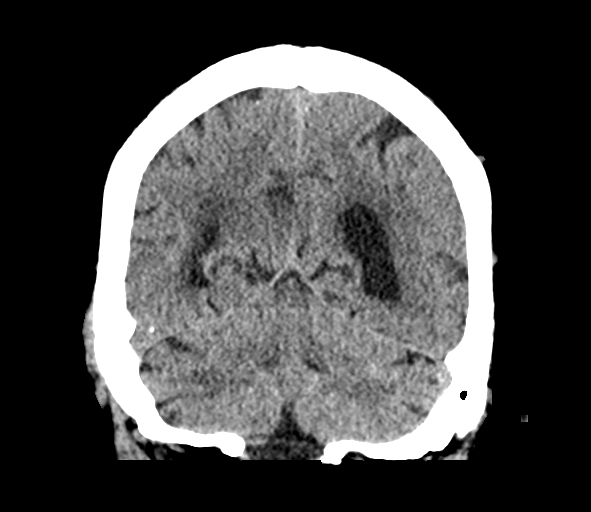
[im 31/70  brain]
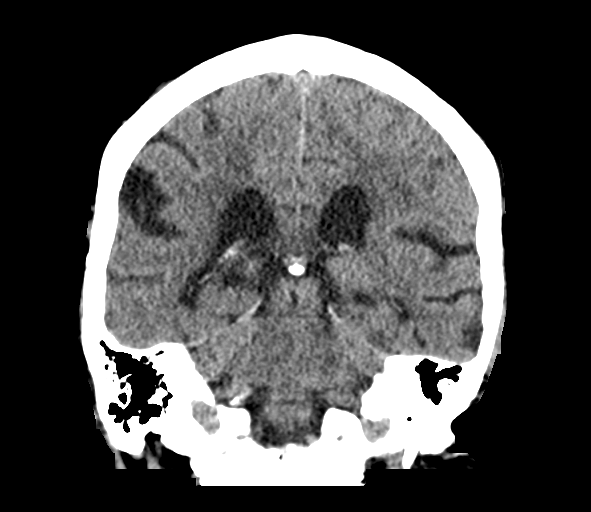
[im 39/70  brain]
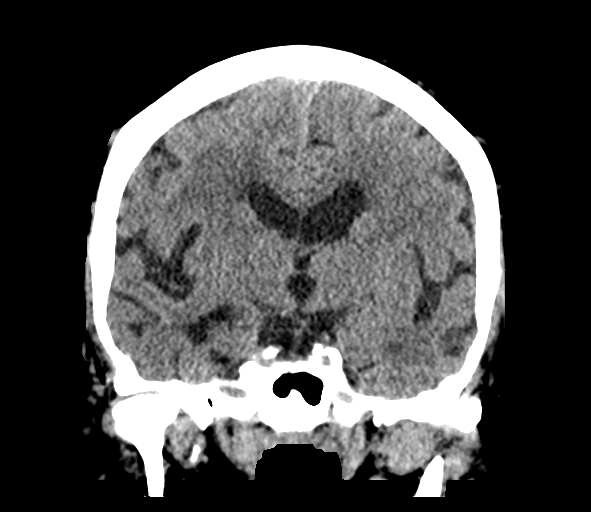

[Series 5: sag soft · sagittal · 0.34mm/px · 3 of 59 slices shown]
[im 20/59  brain]
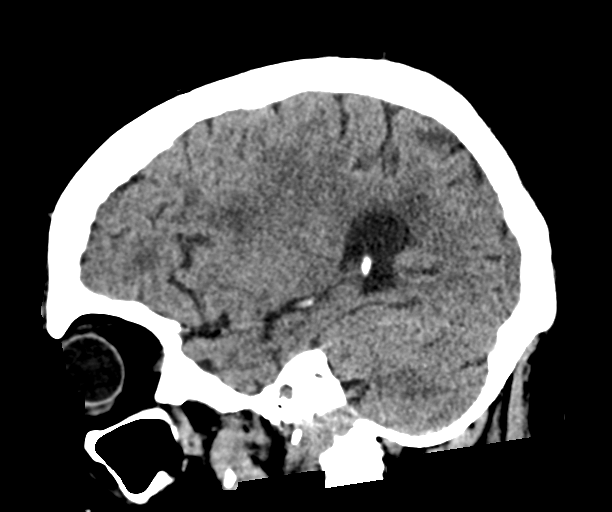
[im 30/59  brain]
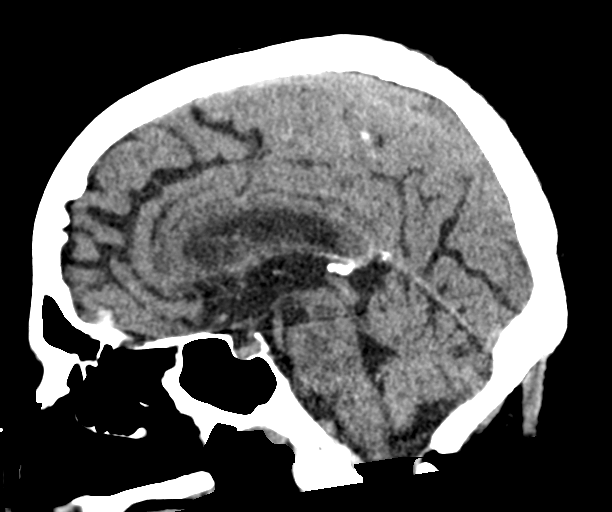
[im 39/59  brain]
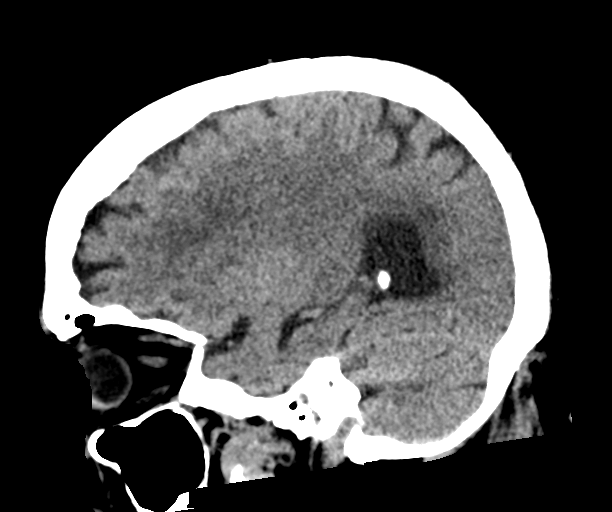

[15 of 47 positions shown; findings below may reference images not displayed]

FINDINGS: Brain: No evidence of acute infarction, hemorrhage, hydrocephalus,
extra-axial collection or mass lesion/mass effect.

Moderate periventricular white matter hypodensities are of uncertain
chronicity but most likely represent chronic small-vessel white
matter ischemic changes.

Mild generalized cerebral atrophy is noted.

Vascular: Carotid atherosclerotic calcifications are noted.

Skull: Normal. Negative for fracture or focal lesion.

Sinuses/Orbits: No acute finding.

Other: None.
IMPRESSION: 1. No evidence of acute intracranial abnormality.
2. Moderate periventricular white matter hypodensities - likely
chronic small-vessel white matter ischemic changes.
3. Mild generalized cerebral atrophy.

## 2022-07-23 IMAGING — DX DG ELBOW COMPLETE 3+V*R*
4 series · 4 of 4 positions shown · non-contrast
Comparison: None.

CLINICAL DATA: Fall, right elbow pain

EXAM:
RIGHT ELBOW - COMPLETE 3+ VIEW

[elbow ap]
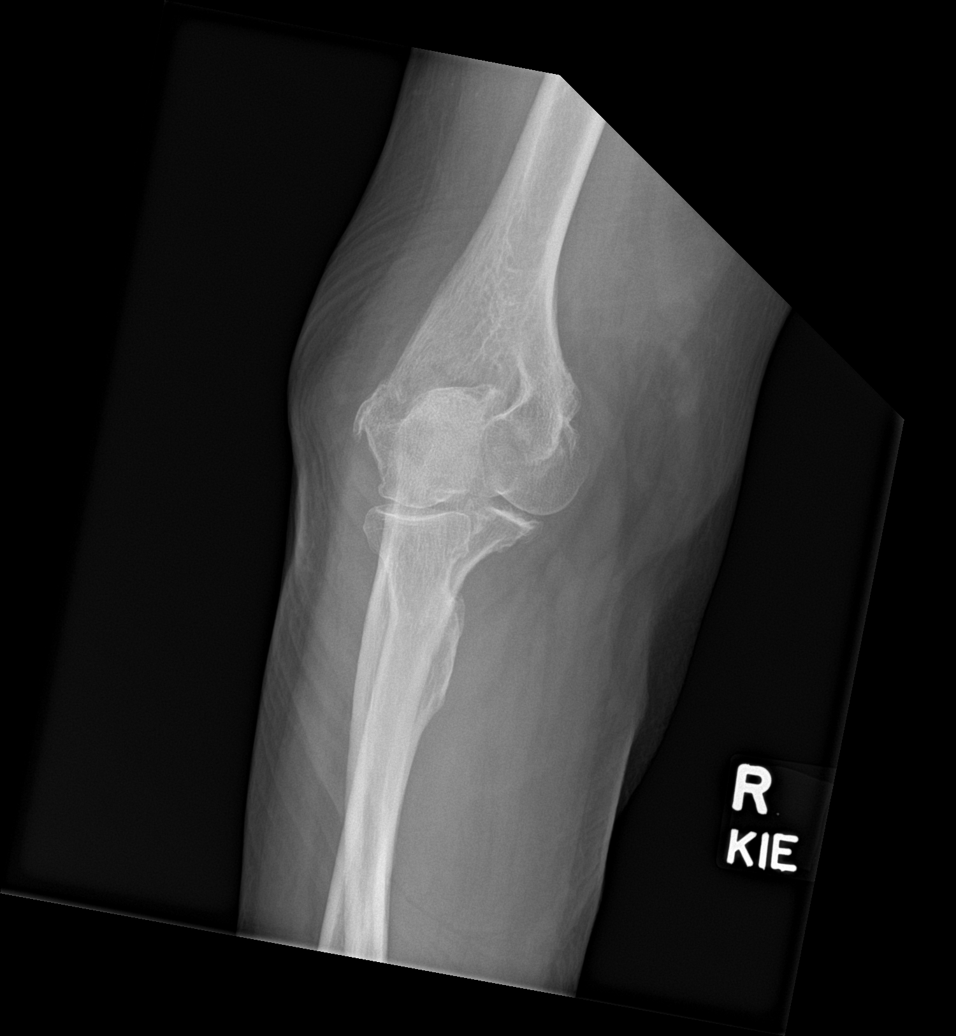

[elbow obl (1 of 2)]
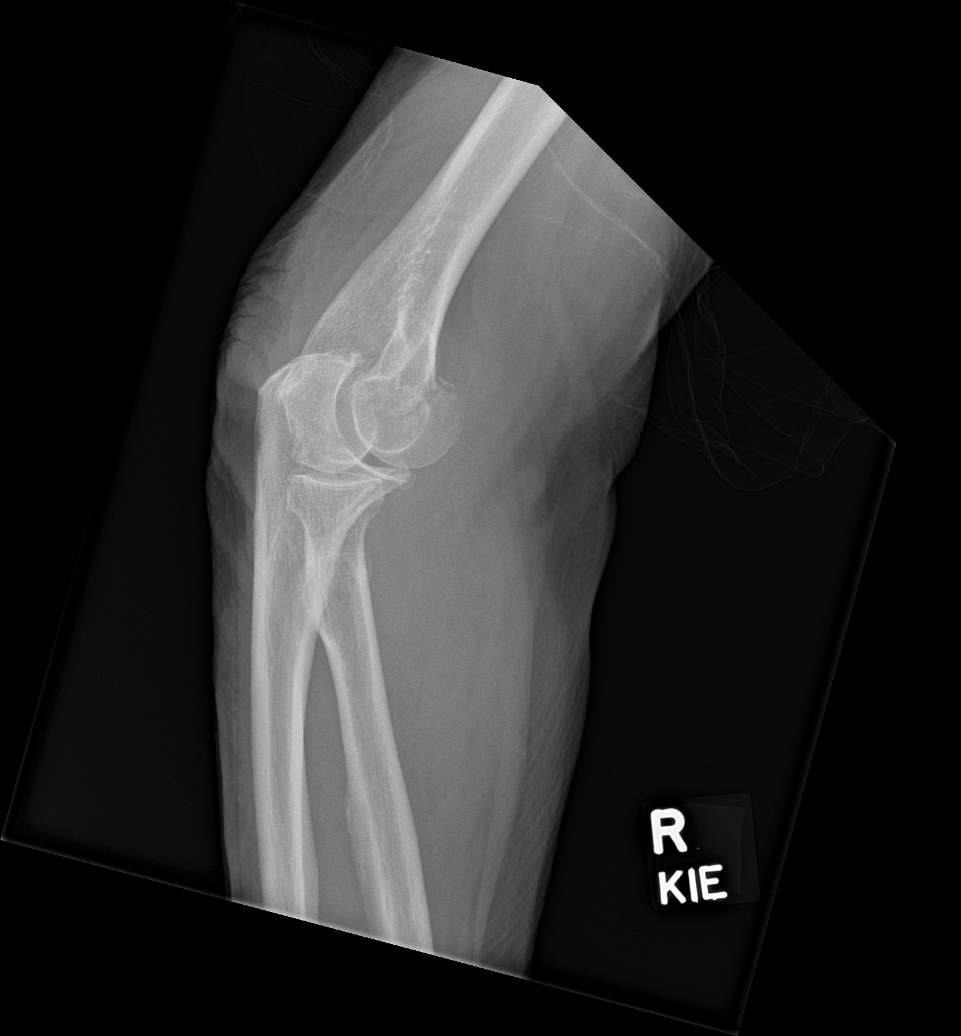

[elbow obl (2 of 2)]
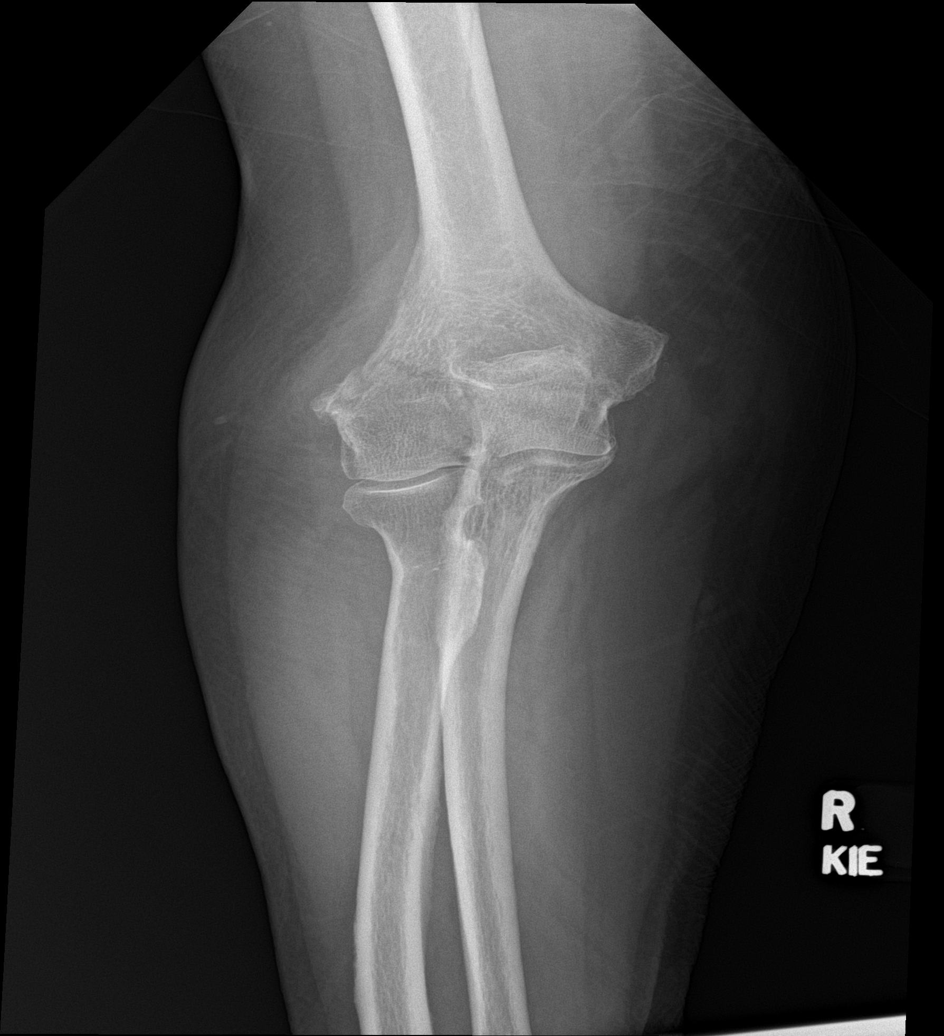

[elbow lat]
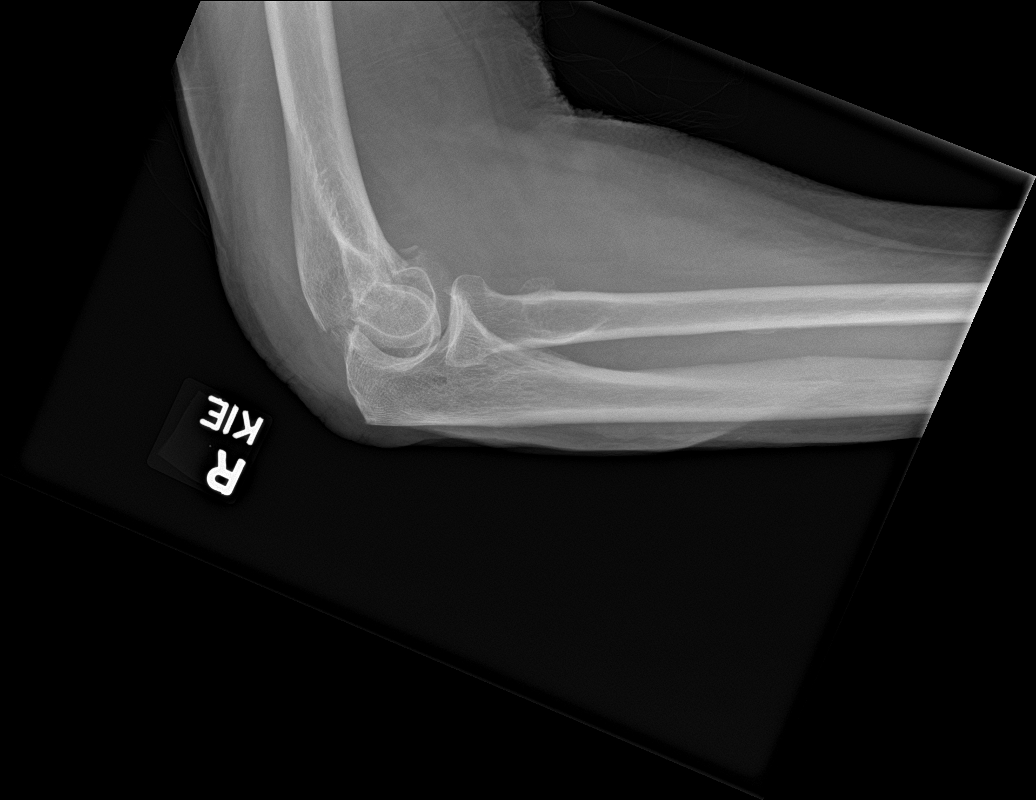

[4 of 4 positions shown; findings below may reference images not displayed]

FINDINGS: Acute transcondylar fracture of the distal right humerus with
intra-articular extension. Slight anterior displacement and
angulation at the fracture site. No fractures identified involving
the proximal radius or ulna. There is a large elbow joint
hemarthrosis. Diffuse soft tissue swelling.
IMPRESSION: Right elbow:

Acute transcondylar fracture of the distal right humerus with
intra-articular extension and slight anterior displacement and
angulation.

## 2022-07-24 DIAGNOSIS — L24A2 Irritant contact dermatitis due to fecal, urinary or dual incontinence: Secondary | ICD-10-CM | POA: Diagnosis not present

## 2022-07-31 DIAGNOSIS — L24A2 Irritant contact dermatitis due to fecal, urinary or dual incontinence: Secondary | ICD-10-CM | POA: Diagnosis not present

## 2022-08-07 DIAGNOSIS — L24A2 Irritant contact dermatitis due to fecal, urinary or dual incontinence: Secondary | ICD-10-CM | POA: Diagnosis not present

## 2022-08-14 DIAGNOSIS — H353211 Exudative age-related macular degeneration, right eye, with active choroidal neovascularization: Secondary | ICD-10-CM | POA: Diagnosis not present

## 2022-09-05 ENCOUNTER — Ambulatory Visit: Payer: PPO | Admitting: Neurology

## 2022-09-06 ENCOUNTER — Ambulatory Visit: Payer: PPO | Admitting: Physician Assistant

## 2022-09-27 DIAGNOSIS — Z23 Encounter for immunization: Secondary | ICD-10-CM | POA: Diagnosis not present

## 2022-10-17 DIAGNOSIS — Z79899 Other long term (current) drug therapy: Secondary | ICD-10-CM | POA: Diagnosis not present

## 2022-10-17 DIAGNOSIS — E559 Vitamin D deficiency, unspecified: Secondary | ICD-10-CM | POA: Diagnosis not present

## 2022-10-17 DIAGNOSIS — E119 Type 2 diabetes mellitus without complications: Secondary | ICD-10-CM | POA: Diagnosis not present

## 2022-10-21 DIAGNOSIS — F5101 Primary insomnia: Secondary | ICD-10-CM | POA: Diagnosis not present

## 2022-10-21 DIAGNOSIS — F03B18 Unspecified dementia, moderate, with other behavioral disturbance: Secondary | ICD-10-CM | POA: Diagnosis not present

## 2022-10-24 DIAGNOSIS — N9089 Other specified noninflammatory disorders of vulva and perineum: Secondary | ICD-10-CM | POA: Diagnosis not present

## 2022-10-31 DIAGNOSIS — N9089 Other specified noninflammatory disorders of vulva and perineum: Secondary | ICD-10-CM | POA: Diagnosis not present

## 2022-11-04 DIAGNOSIS — F5101 Primary insomnia: Secondary | ICD-10-CM | POA: Diagnosis not present

## 2022-11-04 DIAGNOSIS — F03B18 Unspecified dementia, moderate, with other behavioral disturbance: Secondary | ICD-10-CM | POA: Diagnosis not present

## 2023-01-14 DIAGNOSIS — N9089 Other specified noninflammatory disorders of vulva and perineum: Secondary | ICD-10-CM | POA: Diagnosis not present

## 2023-01-14 DIAGNOSIS — D1724 Benign lipomatous neoplasm of skin and subcutaneous tissue of left leg: Secondary | ICD-10-CM | POA: Diagnosis not present

## 2023-01-15 DIAGNOSIS — L988 Other specified disorders of the skin and subcutaneous tissue: Secondary | ICD-10-CM | POA: Diagnosis not present

## 2023-01-20 DIAGNOSIS — F5101 Primary insomnia: Secondary | ICD-10-CM | POA: Diagnosis not present

## 2023-01-20 DIAGNOSIS — F03B18 Unspecified dementia, moderate, with other behavioral disturbance: Secondary | ICD-10-CM | POA: Diagnosis not present

## 2023-01-22 DIAGNOSIS — L988 Other specified disorders of the skin and subcutaneous tissue: Secondary | ICD-10-CM | POA: Diagnosis not present

## 2023-03-14 DIAGNOSIS — R4182 Altered mental status, unspecified: Secondary | ICD-10-CM | POA: Diagnosis not present

## 2023-03-14 DIAGNOSIS — Z79899 Other long term (current) drug therapy: Secondary | ICD-10-CM | POA: Diagnosis not present

## 2023-03-17 DIAGNOSIS — R410 Disorientation, unspecified: Secondary | ICD-10-CM | POA: Diagnosis not present

## 2023-03-17 DIAGNOSIS — N390 Urinary tract infection, site not specified: Secondary | ICD-10-CM | POA: Diagnosis not present

## 2023-03-28 DIAGNOSIS — Z79899 Other long term (current) drug therapy: Secondary | ICD-10-CM | POA: Diagnosis not present

## 2023-03-28 DIAGNOSIS — E119 Type 2 diabetes mellitus without complications: Secondary | ICD-10-CM | POA: Diagnosis not present

## 2023-04-07 DIAGNOSIS — F5101 Primary insomnia: Secondary | ICD-10-CM | POA: Diagnosis not present

## 2023-04-07 DIAGNOSIS — F03B18 Unspecified dementia, moderate, with other behavioral disturbance: Secondary | ICD-10-CM | POA: Diagnosis not present

## 2023-04-16 DIAGNOSIS — R1312 Dysphagia, oropharyngeal phase: Secondary | ICD-10-CM | POA: Diagnosis not present

## 2023-04-16 DIAGNOSIS — N390 Urinary tract infection, site not specified: Secondary | ICD-10-CM | POA: Diagnosis not present

## 2023-04-16 DIAGNOSIS — K219 Gastro-esophageal reflux disease without esophagitis: Secondary | ICD-10-CM | POA: Diagnosis not present

## 2023-06-02 DIAGNOSIS — F5101 Primary insomnia: Secondary | ICD-10-CM | POA: Diagnosis not present

## 2023-06-02 DIAGNOSIS — F03B18 Unspecified dementia, moderate, with other behavioral disturbance: Secondary | ICD-10-CM | POA: Diagnosis not present

## 2023-06-08 DIAGNOSIS — F411 Generalized anxiety disorder: Secondary | ICD-10-CM | POA: Diagnosis not present

## 2023-06-08 DIAGNOSIS — F322 Major depressive disorder, single episode, severe without psychotic features: Secondary | ICD-10-CM | POA: Diagnosis not present

## 2023-06-08 DIAGNOSIS — I10 Essential (primary) hypertension: Secondary | ICD-10-CM | POA: Diagnosis not present

## 2023-06-08 DIAGNOSIS — G309 Alzheimer's disease, unspecified: Secondary | ICD-10-CM | POA: Diagnosis not present

## 2023-07-02 DIAGNOSIS — E559 Vitamin D deficiency, unspecified: Secondary | ICD-10-CM | POA: Diagnosis not present

## 2023-07-02 DIAGNOSIS — Z79899 Other long term (current) drug therapy: Secondary | ICD-10-CM | POA: Diagnosis not present

## 2023-07-02 DIAGNOSIS — E119 Type 2 diabetes mellitus without complications: Secondary | ICD-10-CM | POA: Diagnosis not present

## 2023-07-14 DIAGNOSIS — F5101 Primary insomnia: Secondary | ICD-10-CM | POA: Diagnosis not present

## 2023-07-14 DIAGNOSIS — F03B18 Unspecified dementia, moderate, with other behavioral disturbance: Secondary | ICD-10-CM | POA: Diagnosis not present

## 2023-08-04 DIAGNOSIS — F03B18 Unspecified dementia, moderate, with other behavioral disturbance: Secondary | ICD-10-CM | POA: Diagnosis not present

## 2023-08-04 DIAGNOSIS — F5101 Primary insomnia: Secondary | ICD-10-CM | POA: Diagnosis not present

## 2023-08-18 DIAGNOSIS — F03B18 Unspecified dementia, moderate, with other behavioral disturbance: Secondary | ICD-10-CM | POA: Diagnosis not present

## 2023-08-18 DIAGNOSIS — F5101 Primary insomnia: Secondary | ICD-10-CM | POA: Diagnosis not present

## 2023-09-13 DIAGNOSIS — Z79899 Other long term (current) drug therapy: Secondary | ICD-10-CM | POA: Diagnosis not present

## 2023-09-13 DIAGNOSIS — E119 Type 2 diabetes mellitus without complications: Secondary | ICD-10-CM | POA: Diagnosis not present

## 2023-09-13 DIAGNOSIS — E559 Vitamin D deficiency, unspecified: Secondary | ICD-10-CM | POA: Diagnosis not present

## 2023-10-29 DIAGNOSIS — L24A2 Irritant contact dermatitis due to fecal, urinary or dual incontinence: Secondary | ICD-10-CM | POA: Diagnosis not present

## 2023-11-05 DIAGNOSIS — L24A2 Irritant contact dermatitis due to fecal, urinary or dual incontinence: Secondary | ICD-10-CM | POA: Diagnosis not present

## 2023-11-12 DIAGNOSIS — L24A2 Irritant contact dermatitis due to fecal, urinary or dual incontinence: Secondary | ICD-10-CM | POA: Diagnosis not present

## 2023-11-13 DIAGNOSIS — I739 Peripheral vascular disease, unspecified: Secondary | ICD-10-CM | POA: Diagnosis not present

## 2023-11-13 DIAGNOSIS — L602 Onychogryphosis: Secondary | ICD-10-CM | POA: Diagnosis not present

## 2023-11-13 DIAGNOSIS — L603 Nail dystrophy: Secondary | ICD-10-CM | POA: Diagnosis not present

## 2023-11-13 DIAGNOSIS — L84 Corns and callosities: Secondary | ICD-10-CM | POA: Diagnosis not present

## 2023-11-19 DIAGNOSIS — L24A2 Irritant contact dermatitis due to fecal, urinary or dual incontinence: Secondary | ICD-10-CM | POA: Diagnosis not present

## 2023-11-24 DIAGNOSIS — F03B18 Unspecified dementia, moderate, with other behavioral disturbance: Secondary | ICD-10-CM | POA: Diagnosis not present

## 2023-11-24 DIAGNOSIS — F5101 Primary insomnia: Secondary | ICD-10-CM | POA: Diagnosis not present

## 2023-11-26 DIAGNOSIS — L24A2 Irritant contact dermatitis due to fecal, urinary or dual incontinence: Secondary | ICD-10-CM | POA: Diagnosis not present

## 2023-12-03 DIAGNOSIS — L24A2 Irritant contact dermatitis due to fecal, urinary or dual incontinence: Secondary | ICD-10-CM | POA: Diagnosis not present

## 2023-12-10 DIAGNOSIS — L24A2 Irritant contact dermatitis due to fecal, urinary or dual incontinence: Secondary | ICD-10-CM | POA: Diagnosis not present

## 2023-12-17 DIAGNOSIS — L24A2 Irritant contact dermatitis due to fecal, urinary or dual incontinence: Secondary | ICD-10-CM | POA: Diagnosis not present

## 2023-12-22 DIAGNOSIS — L24A2 Irritant contact dermatitis due to fecal, urinary or dual incontinence: Secondary | ICD-10-CM | POA: Diagnosis not present

## 2024-01-12 DIAGNOSIS — R059 Cough, unspecified: Secondary | ICD-10-CM | POA: Diagnosis not present

## 2024-02-23 DIAGNOSIS — F03B18 Unspecified dementia, moderate, with other behavioral disturbance: Secondary | ICD-10-CM | POA: Diagnosis not present

## 2024-02-23 DIAGNOSIS — F5101 Primary insomnia: Secondary | ICD-10-CM | POA: Diagnosis not present

## 2024-03-12 DIAGNOSIS — E538 Deficiency of other specified B group vitamins: Secondary | ICD-10-CM | POA: Diagnosis not present

## 2024-03-12 DIAGNOSIS — K219 Gastro-esophageal reflux disease without esophagitis: Secondary | ICD-10-CM | POA: Diagnosis not present

## 2024-03-12 DIAGNOSIS — F322 Major depressive disorder, single episode, severe without psychotic features: Secondary | ICD-10-CM | POA: Diagnosis not present

## 2024-03-12 DIAGNOSIS — I10 Essential (primary) hypertension: Secondary | ICD-10-CM | POA: Diagnosis not present

## 2024-03-12 DIAGNOSIS — G309 Alzheimer's disease, unspecified: Secondary | ICD-10-CM | POA: Diagnosis not present

## 2024-03-12 DIAGNOSIS — Z66 Do not resuscitate: Secondary | ICD-10-CM | POA: Diagnosis not present

## 2024-03-12 DIAGNOSIS — I5030 Unspecified diastolic (congestive) heart failure: Secondary | ICD-10-CM | POA: Diagnosis not present

## 2024-03-19 DIAGNOSIS — L603 Nail dystrophy: Secondary | ICD-10-CM | POA: Diagnosis not present

## 2024-03-19 DIAGNOSIS — L602 Onychogryphosis: Secondary | ICD-10-CM | POA: Diagnosis not present

## 2024-03-19 DIAGNOSIS — I739 Peripheral vascular disease, unspecified: Secondary | ICD-10-CM | POA: Diagnosis not present

## 2024-04-21 DIAGNOSIS — L89152 Pressure ulcer of sacral region, stage 2: Secondary | ICD-10-CM | POA: Diagnosis not present

## 2024-04-28 DIAGNOSIS — Z66 Do not resuscitate: Secondary | ICD-10-CM | POA: Diagnosis not present

## 2024-04-28 DIAGNOSIS — L89152 Pressure ulcer of sacral region, stage 2: Secondary | ICD-10-CM | POA: Diagnosis not present

## 2024-04-28 DIAGNOSIS — I5030 Unspecified diastolic (congestive) heart failure: Secondary | ICD-10-CM | POA: Diagnosis not present

## 2024-04-28 DIAGNOSIS — R627 Adult failure to thrive: Secondary | ICD-10-CM | POA: Diagnosis not present

## 2024-04-28 DIAGNOSIS — L89159 Pressure ulcer of sacral region, unspecified stage: Secondary | ICD-10-CM | POA: Diagnosis not present

## 2024-04-28 DIAGNOSIS — F322 Major depressive disorder, single episode, severe without psychotic features: Secondary | ICD-10-CM | POA: Diagnosis not present

## 2024-04-28 DIAGNOSIS — G309 Alzheimer's disease, unspecified: Secondary | ICD-10-CM | POA: Diagnosis not present

## 2024-05-05 DIAGNOSIS — L89152 Pressure ulcer of sacral region, stage 2: Secondary | ICD-10-CM | POA: Diagnosis not present
